# Patient Record
Sex: Male | Born: 1955
Health system: Southern US, Community
[De-identification: ages and names within clinical notes are randomized; demographics above are authoritative.]

## PROBLEM LIST (undated history)

## (undated) DIAGNOSIS — D509 Iron deficiency anemia, unspecified: Secondary | ICD-10-CM

## (undated) DIAGNOSIS — C61 Malignant neoplasm of prostate: Secondary | ICD-10-CM

## (undated) DIAGNOSIS — E785 Hyperlipidemia, unspecified: Secondary | ICD-10-CM

## (undated) DIAGNOSIS — E559 Vitamin D deficiency, unspecified: Secondary | ICD-10-CM

## (undated) DIAGNOSIS — N2589 Other disorders resulting from impaired renal tubular function: Secondary | ICD-10-CM

## (undated) DIAGNOSIS — N185 Chronic kidney disease, stage 5: Secondary | ICD-10-CM

## (undated) DIAGNOSIS — N189 Chronic kidney disease, unspecified: Secondary | ICD-10-CM

## (undated) DIAGNOSIS — N2581 Secondary hyperparathyroidism of renal origin: Secondary | ICD-10-CM

## (undated) DIAGNOSIS — N184 Chronic kidney disease, stage 4 (severe): Secondary | ICD-10-CM

## (undated) DIAGNOSIS — L600 Ingrowing nail: Secondary | ICD-10-CM

## (undated) DIAGNOSIS — Z0000 Encounter for general adult medical examination without abnormal findings: Secondary | ICD-10-CM

## (undated) DIAGNOSIS — H40059 Ocular hypertension, unspecified eye: Secondary | ICD-10-CM

## (undated) DIAGNOSIS — E1121 Type 2 diabetes mellitus with diabetic nephropathy: Secondary | ICD-10-CM

## (undated) DIAGNOSIS — Z125 Encounter for screening for malignant neoplasm of prostate: Secondary | ICD-10-CM

## (undated) DIAGNOSIS — H44009 Unspecified purulent endophthalmitis, unspecified eye: Secondary | ICD-10-CM

## (undated) DIAGNOSIS — D631 Anemia in chronic kidney disease: Secondary | ICD-10-CM

## (undated) DIAGNOSIS — B957 Other staphylococcus as the cause of diseases classified elsewhere: Secondary | ICD-10-CM

## (undated) DIAGNOSIS — K760 Fatty (change of) liver, not elsewhere classified: Secondary | ICD-10-CM

## (undated) DIAGNOSIS — E11319 Type 2 diabetes mellitus with unspecified diabetic retinopathy without macular edema: Secondary | ICD-10-CM

## (undated) DIAGNOSIS — I2510 Atherosclerotic heart disease of native coronary artery without angina pectoris: Secondary | ICD-10-CM

## (undated) DIAGNOSIS — I129 Hypertensive chronic kidney disease with stage 1 through stage 4 chronic kidney disease, or unspecified chronic kidney disease: Secondary | ICD-10-CM

## (undated) DIAGNOSIS — E119 Type 2 diabetes mellitus without complications: Secondary | ICD-10-CM

## (undated) DIAGNOSIS — Z8546 Personal history of malignant neoplasm of prostate: Secondary | ICD-10-CM

## (undated) DIAGNOSIS — M869 Osteomyelitis, unspecified: Secondary | ICD-10-CM

## (undated) DIAGNOSIS — I219 Acute myocardial infarction, unspecified: Secondary | ICD-10-CM

## (undated) HISTORY — DX: Type 2 diabetes mellitus with unspecified diabetic retinopathy without macular edema: E11.319

## (undated) HISTORY — DX: Chronic kidney disease, stage 5: N18.5

## (undated) HISTORY — DX: Acute myocardial infarction, unspecified: I21.9

## (undated) HISTORY — DX: Vitamin D deficiency, unspecified: E55.9

## (undated) HISTORY — DX: Malignant neoplasm of prostate: C61

## (undated) HISTORY — DX: Encounter for general adult medical examination without abnormal findings: Z00.00

## (undated) HISTORY — PX: CORONARY ANGIOPLASTY: SHX604

## (undated) HISTORY — DX: Chronic kidney disease, stage 4 (severe): N18.4

## (undated) HISTORY — DX: Chronic kidney disease, unspecified: N18.9

## (undated) HISTORY — DX: Type 2 diabetes mellitus with diabetic nephropathy: E11.21

## (undated) HISTORY — DX: Secondary hyperparathyroidism of renal origin: N25.81

## (undated) HISTORY — DX: Hyperlipidemia, unspecified: E78.5

## (undated) HISTORY — DX: Other staphylococcus as the cause of diseases classified elsewhere: B95.7

## (undated) HISTORY — DX: Type 2 diabetes mellitus without complications: E11.9

## (undated) HISTORY — PX: PROSTATE SURGERY: SHX751

## (undated) HISTORY — DX: Iron deficiency anemia, unspecified: D50.9

## (undated) HISTORY — DX: Personal history of malignant neoplasm of prostate: Z85.46

## (undated) HISTORY — DX: Other disorders resulting from impaired renal tubular function: N25.89

## (undated) HISTORY — DX: Osteomyelitis, unspecified: M86.9

## (undated) HISTORY — DX: Encounter for screening for malignant neoplasm of prostate: Z12.5

## (undated) HISTORY — PX: BACK SURGERY: SHX140

## (undated) HISTORY — DX: Hypertensive chronic kidney disease with stage 1 through stage 4 chronic kidney disease, or unspecified chronic kidney disease: I12.9

## (undated) HISTORY — DX: Ingrowing nail: L60.0

## (undated) HISTORY — PX: APPENDECTOMY: SHX54

## (undated) HISTORY — DX: Atherosclerotic heart disease of native coronary artery without angina pectoris: I25.10

## (undated) HISTORY — DX: Anemia in chronic kidney disease: D63.1

## (undated) HISTORY — DX: Unspecified purulent endophthalmitis, unspecified eye: H44.009

---

## 1979-05-03 HISTORY — PX: NASAL SEPTUM SURGERY: SHX37

## 2000-10-11 ENCOUNTER — Ambulatory Visit (HOSPITAL_COMMUNITY): Admission: RE | Admit: 2000-10-11 | Discharge: 2000-10-11 | Payer: Self-pay | Admitting: Orthopedic Surgery

## 2000-10-11 ENCOUNTER — Encounter: Payer: Self-pay | Admitting: Orthopedic Surgery

## 2005-10-17 ENCOUNTER — Ambulatory Visit: Payer: Self-pay | Admitting: Endocrinology

## 2005-10-18 ENCOUNTER — Encounter: Payer: Self-pay | Admitting: Endocrinology

## 2005-12-15 ENCOUNTER — Ambulatory Visit: Payer: Self-pay | Admitting: Endocrinology

## 2005-12-21 ENCOUNTER — Ambulatory Visit: Payer: Self-pay | Admitting: Endocrinology

## 2006-02-14 ENCOUNTER — Encounter: Payer: Self-pay | Admitting: Endocrinology

## 2006-12-18 ENCOUNTER — Encounter: Payer: Self-pay | Admitting: Internal Medicine

## 2006-12-18 ENCOUNTER — Inpatient Hospital Stay (HOSPITAL_COMMUNITY): Admission: EM | Admit: 2006-12-18 | Discharge: 2006-12-27 | Payer: Self-pay | Admitting: Internal Medicine

## 2006-12-18 ENCOUNTER — Ambulatory Visit: Payer: Self-pay | Admitting: Internal Medicine

## 2006-12-18 ENCOUNTER — Ambulatory Visit: Payer: Self-pay | Admitting: Cardiology

## 2006-12-19 ENCOUNTER — Encounter: Payer: Self-pay | Admitting: Internal Medicine

## 2006-12-20 ENCOUNTER — Ambulatory Visit: Payer: Self-pay | Admitting: Internal Medicine

## 2006-12-21 HISTORY — PX: VITRECTOMY: SHX106

## 2006-12-28 ENCOUNTER — Encounter: Payer: Self-pay | Admitting: Internal Medicine

## 2007-01-02 ENCOUNTER — Ambulatory Visit: Payer: Self-pay | Admitting: Endocrinology

## 2007-01-02 ENCOUNTER — Encounter: Payer: Self-pay | Admitting: Endocrinology

## 2007-01-02 ENCOUNTER — Telehealth: Payer: Self-pay | Admitting: Internal Medicine

## 2007-01-02 DIAGNOSIS — E119 Type 2 diabetes mellitus without complications: Secondary | ICD-10-CM | POA: Insufficient documentation

## 2007-01-02 DIAGNOSIS — E7800 Pure hypercholesterolemia, unspecified: Secondary | ICD-10-CM | POA: Insufficient documentation

## 2007-01-02 DIAGNOSIS — E785 Hyperlipidemia, unspecified: Secondary | ICD-10-CM | POA: Insufficient documentation

## 2007-01-09 ENCOUNTER — Encounter: Payer: Self-pay | Admitting: Internal Medicine

## 2007-01-15 ENCOUNTER — Ambulatory Visit (HOSPITAL_COMMUNITY): Admission: RE | Admit: 2007-01-15 | Discharge: 2007-01-15 | Payer: Self-pay | Admitting: Ophthalmology

## 2007-01-15 ENCOUNTER — Encounter: Payer: Self-pay | Admitting: Internal Medicine

## 2007-01-19 ENCOUNTER — Ambulatory Visit: Payer: Self-pay | Admitting: Endocrinology

## 2007-01-22 ENCOUNTER — Encounter: Payer: Self-pay | Admitting: Internal Medicine

## 2007-01-24 ENCOUNTER — Telehealth (INDEPENDENT_AMBULATORY_CARE_PROVIDER_SITE_OTHER): Payer: Self-pay | Admitting: *Deleted

## 2007-01-24 ENCOUNTER — Ambulatory Visit: Payer: Self-pay | Admitting: Internal Medicine

## 2007-01-24 DIAGNOSIS — M869 Osteomyelitis, unspecified: Secondary | ICD-10-CM | POA: Insufficient documentation

## 2007-01-24 DIAGNOSIS — H44009 Unspecified purulent endophthalmitis, unspecified eye: Secondary | ICD-10-CM | POA: Insufficient documentation

## 2007-01-24 DIAGNOSIS — B957 Other staphylococcus as the cause of diseases classified elsewhere: Secondary | ICD-10-CM | POA: Insufficient documentation

## 2007-01-30 ENCOUNTER — Telehealth: Payer: Self-pay | Admitting: Internal Medicine

## 2007-01-30 ENCOUNTER — Encounter: Payer: Self-pay | Admitting: *Deleted

## 2007-01-31 ENCOUNTER — Encounter: Payer: Self-pay | Admitting: Endocrinology

## 2007-01-31 ENCOUNTER — Ambulatory Visit: Payer: Self-pay | Admitting: Endocrinology

## 2007-02-21 ENCOUNTER — Encounter: Admission: RE | Admit: 2007-02-21 | Discharge: 2007-02-21 | Payer: Self-pay | Admitting: Neurosurgery

## 2007-03-16 ENCOUNTER — Encounter: Payer: Self-pay | Admitting: Endocrinology

## 2007-05-14 ENCOUNTER — Ambulatory Visit (HOSPITAL_COMMUNITY): Admission: RE | Admit: 2007-05-14 | Discharge: 2007-05-14 | Payer: Self-pay | Admitting: Ophthalmology

## 2007-07-11 ENCOUNTER — Ambulatory Visit: Payer: Self-pay | Admitting: Endocrinology

## 2007-07-12 ENCOUNTER — Encounter (INDEPENDENT_AMBULATORY_CARE_PROVIDER_SITE_OTHER): Payer: Self-pay | Admitting: *Deleted

## 2007-07-12 LAB — CONVERTED CEMR LAB
Bilirubin, Direct: 0.2 mg/dL (ref 0.0–0.3)
Eosinophils Absolute: 0.2 10*3/uL (ref 0.0–0.6)
Eosinophils Relative: 2.5 % (ref 0.0–5.0)
GFR calc Af Amer: 101 mL/min
GFR calc non Af Amer: 83 mL/min
Glucose, Bld: 226 mg/dL — ABNORMAL HIGH (ref 70–99)
HDL: 35.2 mg/dL — ABNORMAL LOW (ref >39.0)
Hemoglobin: 15.1 g/dL (ref 13.0–17.0)
Hgb A1c MFr Bld: 7.9 % — ABNORMAL HIGH (ref 4.6–6.0)
Iron: 89 ug/dL (ref 42–165)
Lymphocytes Relative: 25.2 % (ref 12.0–46.0)
MCV: 97 fL (ref 78.0–100.0)
Monocytes Absolute: 0.6 10*3/uL (ref 0.2–0.7)
Neutro Abs: 4.9 10*3/uL (ref 1.4–7.7)
Neutrophils Relative %: 64.4 % (ref 43.0–77.0)
Nitrite: NEGATIVE
Platelets: 223 10*3/uL (ref 150–400)
Potassium: 4.4 meq/L (ref 3.5–5.1)
Saturation Ratios: 28.7 % (ref 20.0–50.0)
Sodium: 138 meq/L (ref 135–145)
Total Protein: 7.7 g/dL (ref 6.0–8.3)
Triglycerides: 225 mg/dL (ref 0–149)
Urine Glucose: 500 mg/dL — AB
WBC: 7.6 10*3/uL (ref 4.5–10.5)

## 2007-07-16 LAB — CONVERTED CEMR LAB: Calcium, Total (PTH): 9.2 mg/dL (ref 8.4–10.5)

## 2007-07-31 ENCOUNTER — Ambulatory Visit: Payer: Self-pay | Admitting: Endocrinology

## 2007-07-31 DIAGNOSIS — L600 Ingrowing nail: Secondary | ICD-10-CM | POA: Insufficient documentation

## 2007-08-08 ENCOUNTER — Encounter: Payer: Self-pay | Admitting: Endocrinology

## 2007-10-29 ENCOUNTER — Encounter: Payer: Self-pay | Admitting: Endocrinology

## 2007-11-07 ENCOUNTER — Telehealth: Payer: Self-pay | Admitting: Endocrinology

## 2007-11-20 ENCOUNTER — Ambulatory Visit: Payer: Self-pay | Admitting: Endocrinology

## 2008-09-02 IMAGING — CR DG CHEST 1V PORT
1 series · 1 of 1 positions shown · non-contrast
Comparison: None.

CLINICAL DATA: Sepsis/shock. 
 PORTABLE CHEST ? 1 VIEW:

[view not recorded]
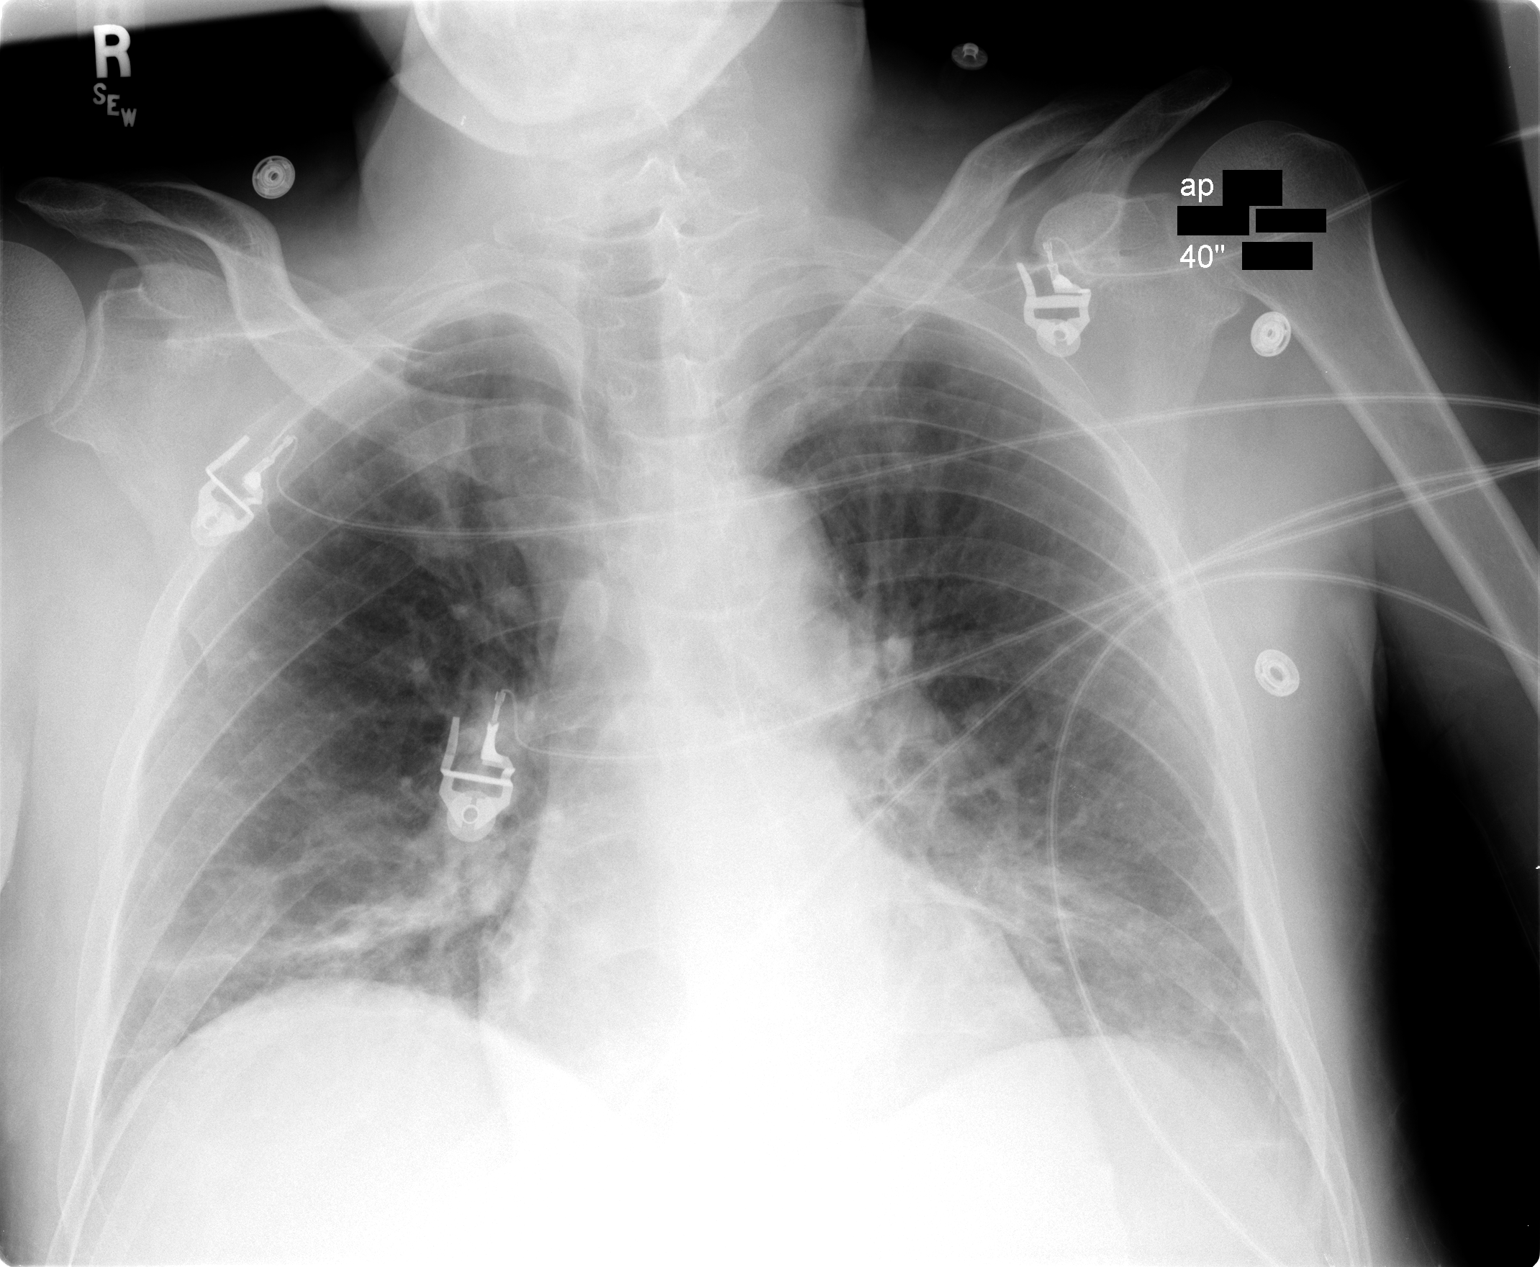

[1 of 1 positions shown; findings below may reference images not displayed]

FINDINGS: Heart and vascularity normal.  There are bilateral lower lobe densities consistent with subsegmental atelectasis or atelectatic pneumonia.  No heart failure.
IMPRESSION: Bilateral lower lobe subsegmental atelectasis or atelectatic pneumonia.

## 2008-09-06 IMAGING — CR DG LUMBAR SPINE 1V
1 series · 1 of 1 positions shown · non-contrast
Comparison: Lumbar MRI 12/19/06.

CLINICAL DATA: Sepsis.  L4-5 laminectomy.
 PORTABLE LUMBAR SPINE ? 1 VIEW ? 12/22/06:

[view not recorded]
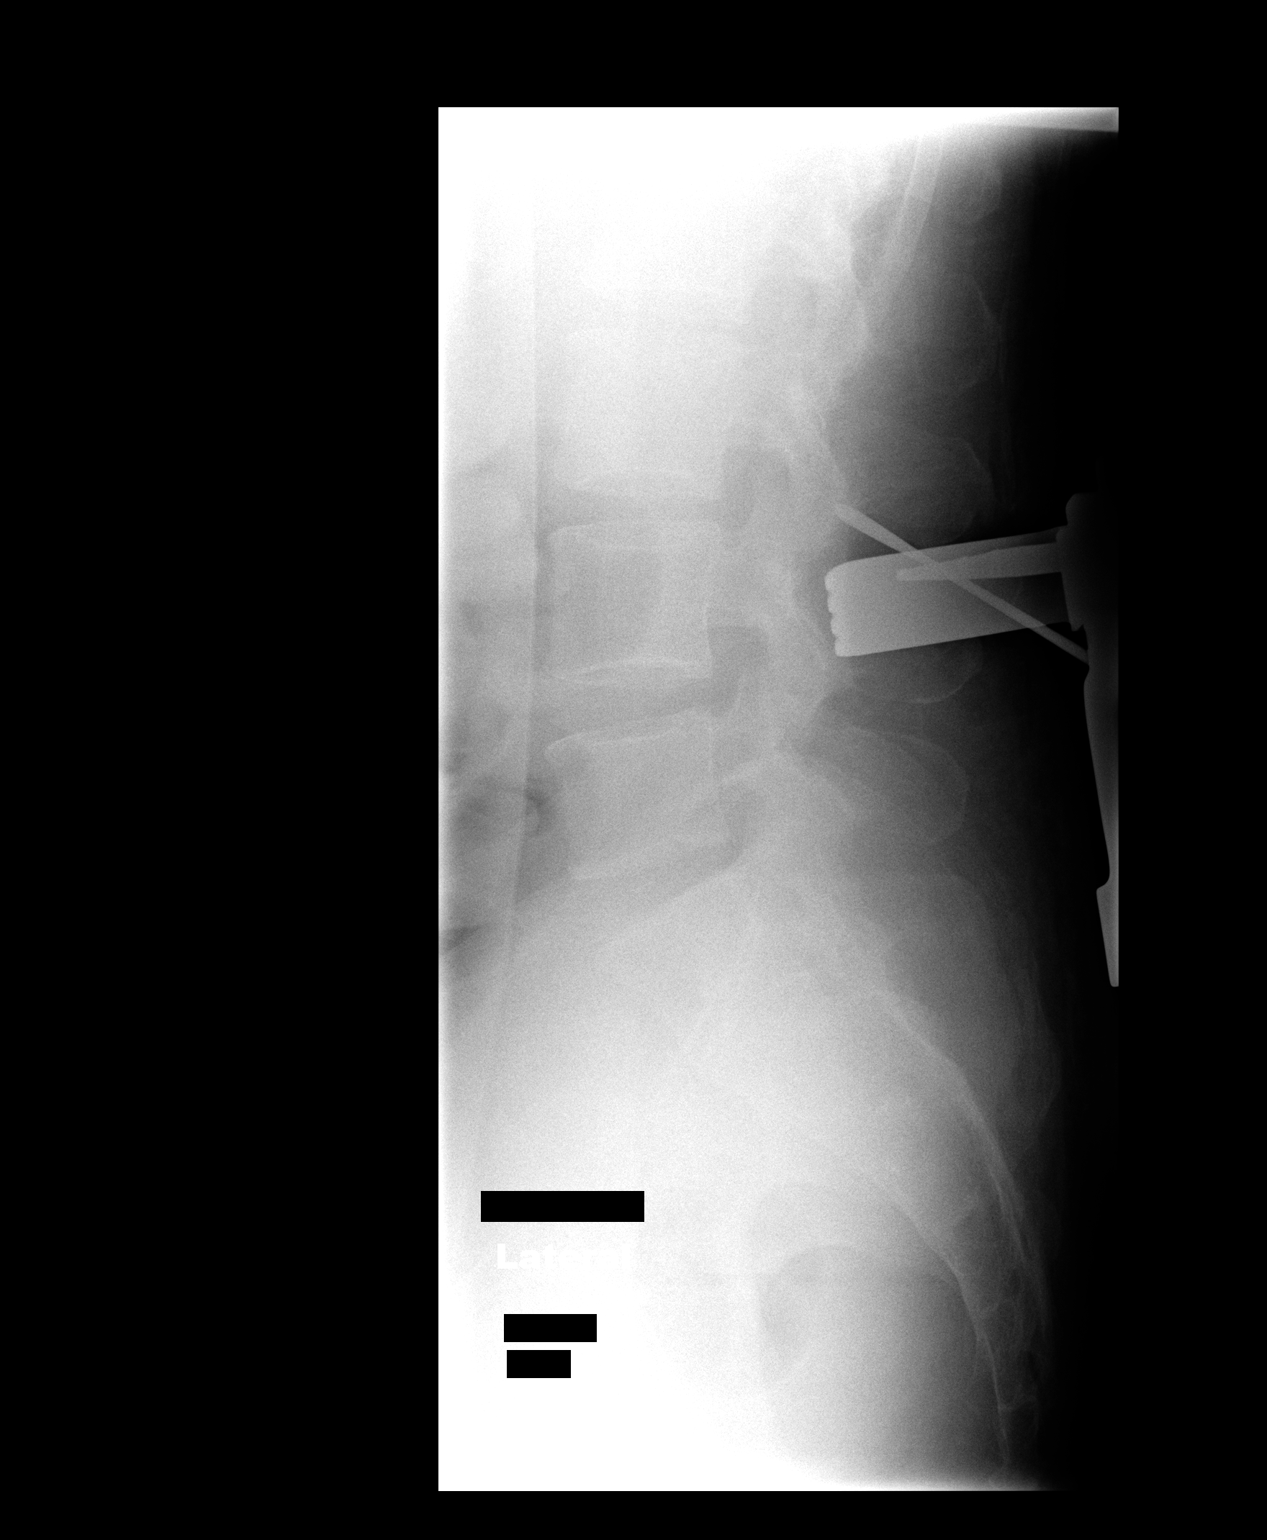

[1 of 1 positions shown; findings below may reference images not displayed]

FINDINGS: As on the MRI, five lumbar type vertebral bodies are assumed. There are retractors posterior to the L4 vertebral body, overlapping the L4 spinous process.  A probe is directed towards the posterior aspect of the L3-4 disk space level.
IMPRESSION: Intraoperative localization as described.

## 2008-09-10 IMAGING — CR DG CHEST 1V PORT
1 series · 1 of 1 positions shown · non-contrast
Comparison: 12/20/06.

CLINICAL DATA: PICC placement.  
 PORTABLE CHEST ? 1 VIEW:

[view not recorded]
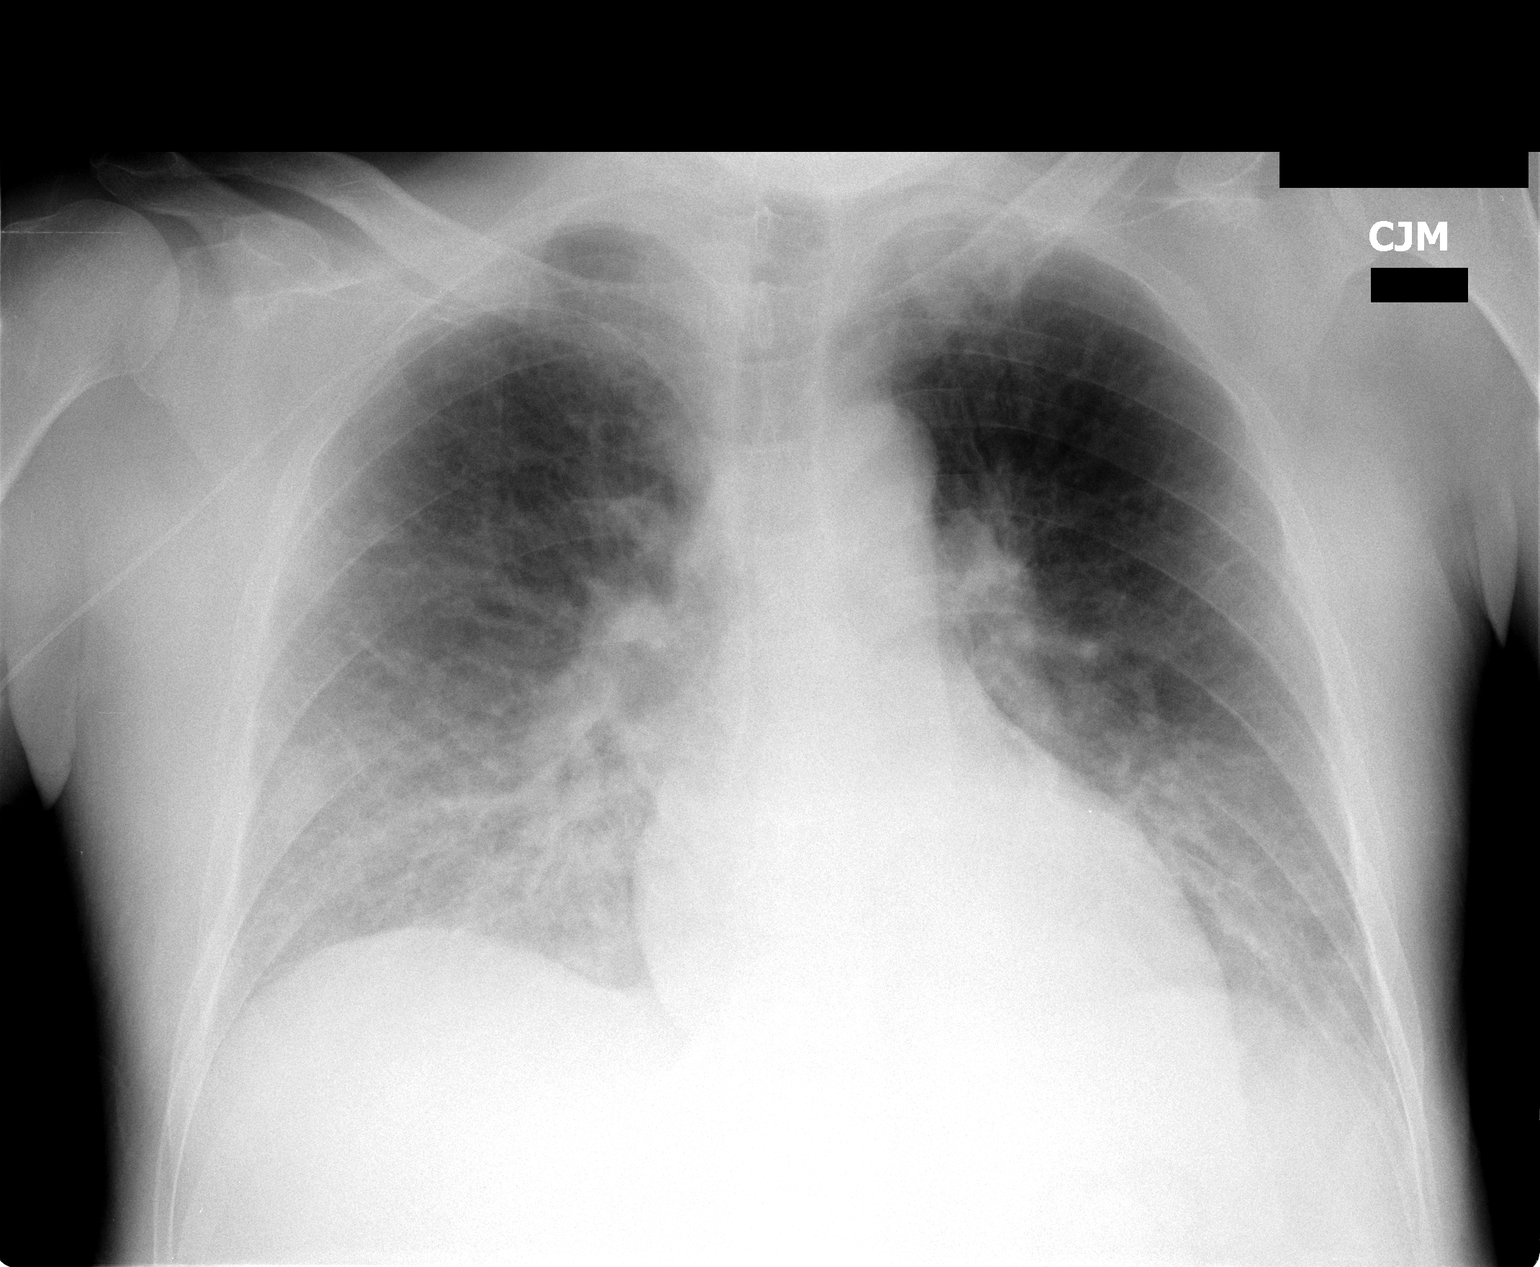

[1 of 1 positions shown; findings below may reference images not displayed]

FINDINGS: New right-sided PICC is in place with the tip in the lower SVC.  No pneumothorax.  Nodular opacities seen on the prior study are not as well visualized today.  Indistinctness of the pulmonary vasculature is compatible with interstitial edema.
IMPRESSION: 1.  PICC in place without complicating features.  
 2.  Mild interstitial pulmonary edema.

## 2008-09-30 IMAGING — CR DG CHEST 2V
2 series · 2 of 2 positions shown · non-contrast
Comparison: 12/26/2006.

CLINICAL DATA: Retinal detachment. Preoperative evaluation.

PORTABLE CHEST - 1 VIEW

[view not recorded (1 of 2)]
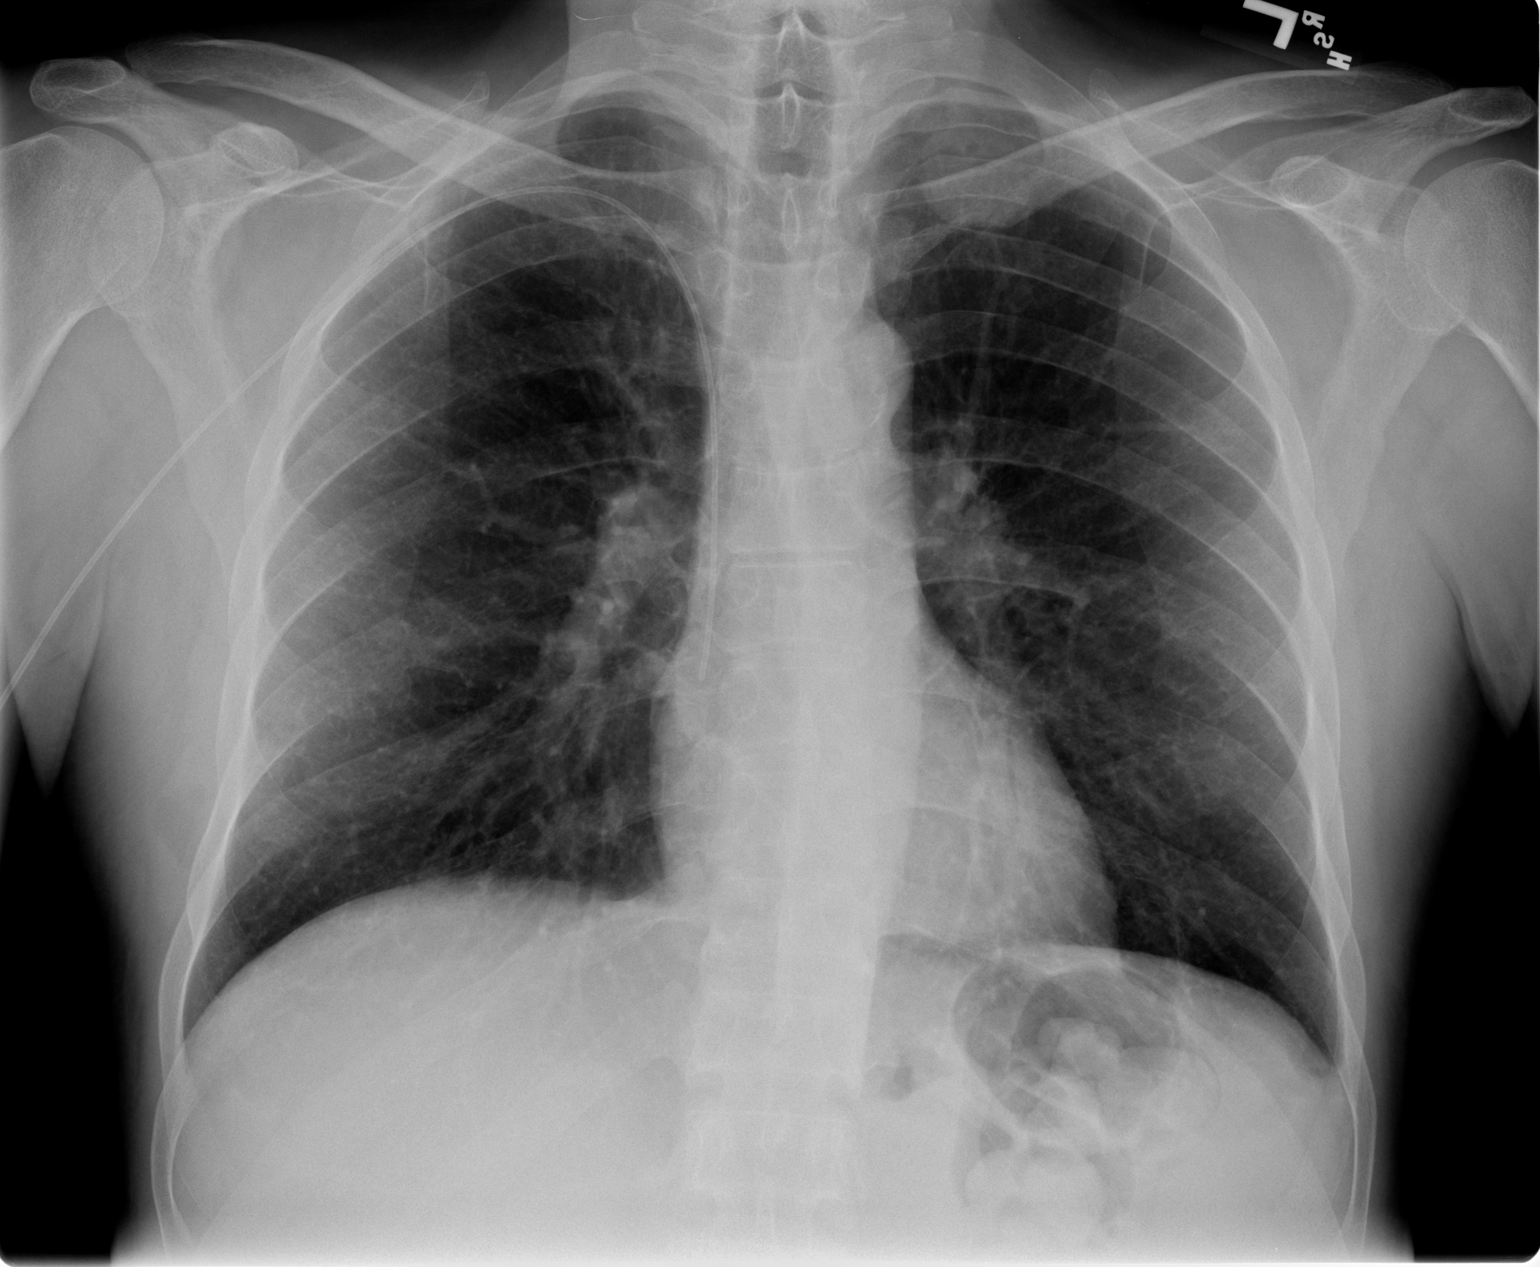

[view not recorded (2 of 2)]
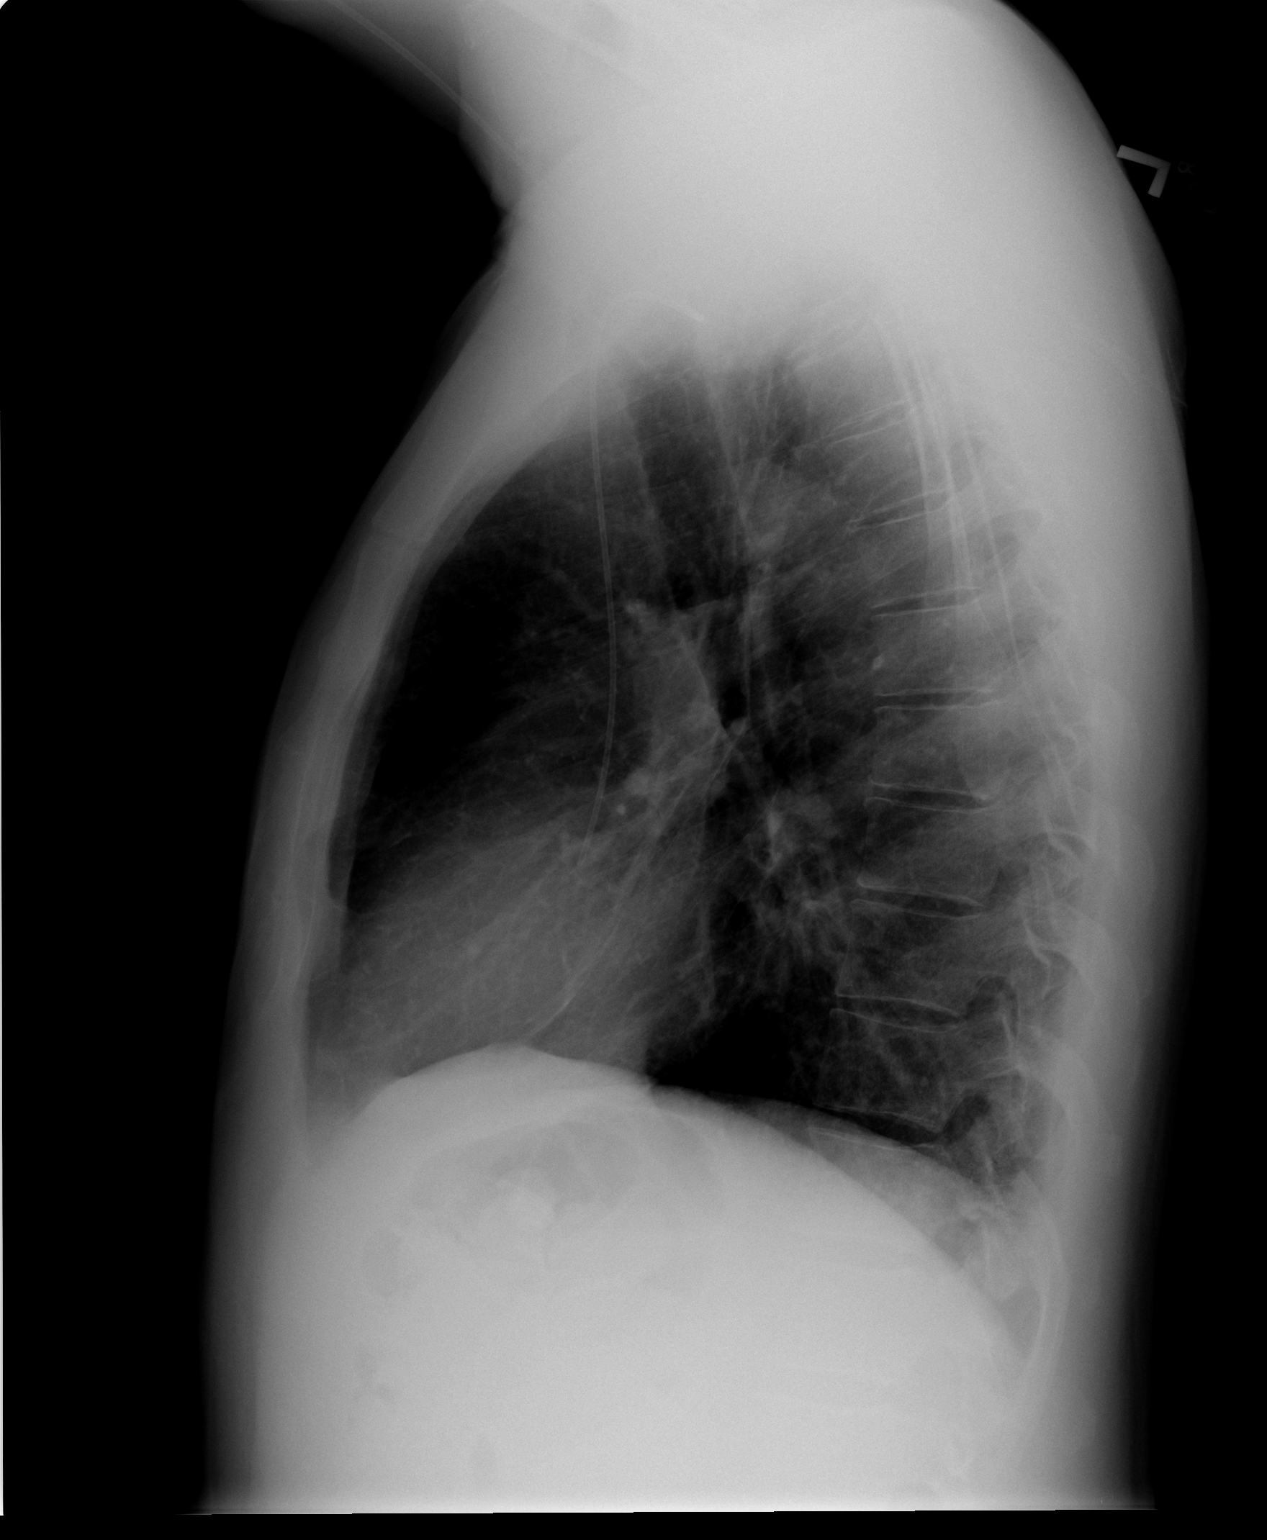

[2 of 2 positions shown; findings below may reference images not displayed]

FINDINGS: Stable right PICC. Normal sized heart. Mild changes of COPD and
chronic bronchitis. Mild scoliosis.  

IMPRESSION

Mild changes of COPD and chronic bronchitis. No acute abnormality.

## 2009-01-15 ENCOUNTER — Telehealth: Payer: Self-pay | Admitting: Endocrinology

## 2009-01-29 ENCOUNTER — Ambulatory Visit: Payer: Self-pay | Admitting: Endocrinology

## 2009-01-29 ENCOUNTER — Telehealth: Payer: Self-pay | Admitting: Endocrinology

## 2009-01-30 LAB — CONVERTED CEMR LAB
AST: 26 units/L (ref 0–37)
Albumin: 4.4 g/dL (ref 3.5–5.2)
Alkaline Phosphatase: 80 units/L (ref 39–117)
Basophils Relative: 0.4 % (ref 0.0–3.0)
CO2: 29 meq/L (ref 19–32)
Calcium: 9.9 mg/dL (ref 8.4–10.5)
Direct LDL: 146.8 mg/dL
GFR calc non Af Amer: 66.83 mL/min (ref >60)
HCT: 43.6 % (ref 39.0–52.0)
Hemoglobin: 15.7 g/dL (ref 13.0–17.0)
Lymphocytes Relative: 22.7 % (ref 12.0–46.0)
Lymphs Abs: 1.6 10*3/uL (ref 0.7–4.0)
MCHC: 35.9 g/dL (ref 30.0–36.0)
Monocytes Relative: 6.4 % (ref 3.0–12.0)
Neutro Abs: 4.7 10*3/uL (ref 1.4–7.7)
Nitrite: NEGATIVE
Potassium: 4.5 meq/L (ref 3.5–5.1)
RBC: 4.44 M/uL (ref 4.22–5.81)
Sodium: 139 meq/L (ref 135–145)
Specific Gravity, Urine: 1.025 (ref 1.000–1.030)
Total CHOL/HDL Ratio: 7
Total Protein, Urine: NEGATIVE mg/dL
Total Protein: 7.7 g/dL (ref 6.0–8.3)
Triglycerides: 240 mg/dL — ABNORMAL HIGH (ref 0.0–149.0)
pH: 5.5 (ref 5.0–8.0)

## 2009-02-16 ENCOUNTER — Encounter: Payer: Self-pay | Admitting: Endocrinology

## 2009-02-19 ENCOUNTER — Telehealth (INDEPENDENT_AMBULATORY_CARE_PROVIDER_SITE_OTHER): Payer: Self-pay | Admitting: *Deleted

## 2009-03-06 ENCOUNTER — Encounter: Payer: Self-pay | Admitting: Endocrinology

## 2009-03-12 ENCOUNTER — Encounter: Payer: Self-pay | Admitting: Endocrinology

## 2009-04-07 ENCOUNTER — Encounter: Payer: Self-pay | Admitting: Endocrinology

## 2009-05-02 DIAGNOSIS — C61 Malignant neoplasm of prostate: Secondary | ICD-10-CM

## 2009-05-02 HISTORY — DX: Malignant neoplasm of prostate: C61

## 2009-05-04 ENCOUNTER — Encounter (INDEPENDENT_AMBULATORY_CARE_PROVIDER_SITE_OTHER): Payer: Self-pay | Admitting: Urology

## 2009-05-04 ENCOUNTER — Inpatient Hospital Stay (HOSPITAL_COMMUNITY): Admission: RE | Admit: 2009-05-04 | Discharge: 2009-05-05 | Payer: Self-pay | Admitting: Urology

## 2009-05-12 ENCOUNTER — Encounter: Payer: Self-pay | Admitting: Endocrinology

## 2009-07-08 ENCOUNTER — Encounter: Payer: Self-pay | Admitting: Endocrinology

## 2009-11-24 ENCOUNTER — Encounter: Payer: Self-pay | Admitting: Endocrinology

## 2010-02-18 ENCOUNTER — Ambulatory Visit: Payer: Self-pay | Admitting: Endocrinology

## 2010-02-18 LAB — CONVERTED CEMR LAB
ALT: 21 units/L (ref 0–53)
Alkaline Phosphatase: 80 units/L (ref 39–117)
Basophils Relative: 0.4 % (ref 0.0–3.0)
Bilirubin Urine: NEGATIVE
Bilirubin, Direct: 0.1 mg/dL (ref 0.0–0.3)
Chloride: 105 meq/L (ref 96–112)
Cholesterol: 212 mg/dL — ABNORMAL HIGH (ref 0–200)
Creatinine, Ser: 1 mg/dL (ref 0.4–1.5)
Creatinine,U: 115.5 mg/dL
Eosinophils Relative: 2.8 % (ref 0.0–5.0)
Hemoglobin, Urine: NEGATIVE
Hemoglobin: 15 g/dL (ref 13.0–17.0)
MCV: 100 fL (ref 78.0–100.0)
Microalb, Ur: 5.7 mg/dL — ABNORMAL HIGH (ref 0.0–1.9)
Monocytes Absolute: 0.4 10*3/uL (ref 0.1–1.0)
Neutrophils Relative %: 68 % (ref 43.0–77.0)
PSA: 0 ng/mL — ABNORMAL LOW (ref 0.10–4.00)
Potassium: 4.6 meq/L (ref 3.5–5.1)
RBC: 4.26 M/uL (ref 4.22–5.81)
Sodium: 139 meq/L (ref 135–145)
Total CHOL/HDL Ratio: 6
Total Protein: 7.1 g/dL (ref 6.0–8.3)
Urine Glucose: 250 mg/dL
Urobilinogen, UA: 0.2 (ref 0.0–1.0)
WBC: 6.8 10*3/uL (ref 4.5–10.5)

## 2010-02-25 ENCOUNTER — Encounter: Payer: Self-pay | Admitting: Endocrinology

## 2010-02-25 ENCOUNTER — Ambulatory Visit: Payer: Self-pay | Admitting: Endocrinology

## 2010-02-25 DIAGNOSIS — C61 Malignant neoplasm of prostate: Secondary | ICD-10-CM | POA: Insufficient documentation

## 2010-05-02 HISTORY — PX: CARDIAC VALVE SURGERY: SHX40

## 2010-05-30 LAB — CONVERTED CEMR LAB
ALT: 30 units/L (ref 0–53)
Basophils Absolute: 0 10*3/uL (ref 0.0–0.1)
Bilirubin Urine: NEGATIVE
Bilirubin, Direct: 0.1 mg/dL (ref 0.0–0.3)
Calcium: 9.4 mg/dL (ref 8.4–10.5)
Cholesterol: 225 mg/dL (ref 0–200)
Creatinine, Ser: 1 mg/dL (ref 0.4–1.5)
Creatinine,U: 83.2 mg/dL
Crystals: NEGATIVE
Direct LDL: 129.1 mg/dL
GFR calc Af Amer: 101 mL/min
GFR calc non Af Amer: 83 mL/min
HCT: 37.8 % — ABNORMAL LOW (ref 39.0–52.0)
Hemoglobin, Urine: NEGATIVE
Leukocytes, UA: NEGATIVE
Lymphocytes Relative: 28.5 % (ref 12.0–46.0)
MCHC: 35.9 g/dL (ref 30.0–36.0)
Microalb Creat Ratio: 24 mg/g (ref 0.0–30.0)
Microalb, Ur: 2 mg/dL — ABNORMAL HIGH (ref 0.0–1.9)
Monocytes Absolute: 0.4 10*3/uL (ref 0.1–1.0)
Monocytes Relative: 7.6 % (ref 3.0–12.0)
Neutro Abs: 3.5 10*3/uL (ref 1.4–7.7)
Nitrite: NEGATIVE
PSA: 3.25 ng/mL (ref 0.10–4.00)
Platelets: 223 10*3/uL (ref 150–400)
RDW: 11.7 % (ref 11.5–14.6)
Sodium: 137 meq/L (ref 135–145)
Total Bilirubin: 1.3 mg/dL — ABNORMAL HIGH (ref 0.3–1.2)
Total CHOL/HDL Ratio: 6.8
Total Protein, Urine: NEGATIVE mg/dL
Triglycerides: 328 mg/dL (ref 0–149)
VLDL: 66 mg/dL — ABNORMAL HIGH (ref 0–40)

## 2010-06-01 NOTE — Assessment & Plan Note (Signed)
Summary: CPX   Vital Signs:  Patient profile:   55 year old male Height:      65 inches (165.10 cm) Weight:      167.50 pounds (76.14 kg) BMI:     27.97 O2 Sat:      97 % on Room air Temp:     97.5 degrees F (36.39 degrees C) oral Pulse rate:   69 / minute Pulse rhythm:   regular BP sitting:   122 / 86  (left arm) Cuff size:   regular  Vitals Entered By: Rebeca Alert MA (February 25, 2010 8:37 AM)  O2 Flow:  Room air CC: Physical/aj Is Patient Diabetic? Yes Comments Pt is no longer taking Januvia, Acots, Pravachol, Isopto Hyoscine, Zymar   CC:  Physical/aj.  Current Medications (verified): 1)  Isopto Hyoscine 0.25 %  Soln (Scopolamine Hbr) .... Use Qid Qd 2)  Zymar 0.3 %  Soln (Gatifloxacin) .... Use Qid Qd 3)  Glucose Test Strips and Lancets .... Any Brand, Qd 4)  Januvia 100 Mg  Tabs (Sitagliptin Phosphate) .... Qd 5)  Metformin Hcl 500 Mg Xr24h-Tab (Metformin Hcl) .... 2 Bid 6)  Pravachol 80 Mg Tabs (Pravastatin Sodium) .Marland Kitchen.. 1 Qhs 7)  Actos 45 Mg Tabs (Pioglitazone Hcl) .Marland Kitchen.. 1 Qd  Allergies (verified): No Known Drug Allergies  Family History: Reviewed history from 11/20/2007 and no changes required. father had prostate cancer  Social History: Reviewed history from 01/29/2009 and no changes required. works heating/ac married  Review of Systems  The patient denies fever, weight loss, decreased hearing, syncope, dyspnea on exertion, prolonged cough, headaches, abdominal pain, melena, hematochezia, severe indigestion/heartburn, hematuria, suspicious skin lesions, and depression.         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   Rectal:  sees urology  Prostate:  sees urology  Skin:  there are 2 bite-marks on the left thigh (dog attack).  no open ulcer or erythema there are healing abrasions on the left knee Additional Exam:  SEPARATE EVALUATION FOLLOWS--EACH PROBLEM HERE IS NEW, NOT RESPONDING TO TREATMENT,  OR POSES SIGNIFICANT RISK TO THE PATIENT'S HEALTH: HISTORY OF THE PRESENT ILLNESS: dm:  is not taking 2 of his 3 meds.  no hypoglycemia. dyslipidemia:  he does not take pracachol.  no weight gain PAST MEDICAL HISTORY reviewed and up to date today. REVIEW OF SYSTEMS: denies chest pain PHYSICAL EXAMINATION: no deformity.  no ulcer on the feet.  feet are of normal color and temp.  no edema dorsalis pedis intact bilat.  no carotid bruit clear to auscultation.  no respiratory distress LAB/XRAY RESULTS: a1c and lipids are reviewed.   IMPRESSION: dyslipidemia, needs increased rx dm, needs increased rx PLAN: see instruction sheet   Impression & Recommendations:  Problem # 1:  ROUTINE GENERAL MEDICAL EXAM@HEALTH  CARE FACL (ICD-V70.0)  Medications Added to Medication List This Visit: 1)  Januvia 100 Mg Tabs (Sitagliptin phosphate) .Marland Kitchen.. 1 tab each am 2)  Metformin Hcl 500 Mg Xr24h-tab (Metformin hcl) .... 2 tabs two times a day 3)  Pravachol 80 Mg Tabs (Pravastatin sodium) .Marland Kitchen.. 1 at bedtime 4)  Onetouch Ultra Blue Strp (Glucose blood) .... Once daily, and lancets 250.00  Other Orders: EKG w/ Interpretation (93000) Admin 1st Vaccine [id number]) Flu Vaccine 62yrs + [id number]) Pneumococcal Vaccine [id number]) Admin of Any Addtl Vaccine [id number]) Est. Patient Level III [id number]) Est. Patient 40-64 years [id number])  Patient Instructions: 1)  resume januvia 100 mg each  am, and pravastatin 80 mg at bedtime. 2)  good diet and exercise habits significanly improve the control of your diabetes.  please let me know if you wish to be referred to a dietician.  high blood sugar is very risky to your health.  you should see an eye doctor every year. 3)  controlling your blood pressure and cholesterol drastically reduces the damage diabetes does to your body.  this also applies to quitting smoking.  please discuss these with your doctor.  you should take an aspirin every day, unless you have been advised by a doctor not  to. 4)  please consider these measures for your health:  minimize alcohol.  do not use tobacco products.  have a colonoscopy at least every 10 years from age 55.  keep firearms safely stored.  always use seat belts.  have working smoke alarms in your home.  see an eye doctor and dentist regularly.  never drive under the influence of alcohol or drugs (including prescription drugs).   5)  Please schedule a follow-up appointment in 6 months. Prescriptions: ONETOUCH ULTRA BLUE  STRP (GLUCOSE BLOOD) once daily, and lancets 250.00  #100 x 3   Entered and Authorized by:   Donavan Foil MD   Signed by:   Donavan Foil MD on 02/25/2010   Method used:   Electronically to        [facility]. [phone]* (retail)       [address], [place]  [address]       Ph: [phone] or [phone]       Fax: [phone]   RxID:   [id number] PRAVACHOL 80 MG TABS (PRAVASTATIN SODIUM) 1 at bedtime  #90 x 3   Entered and Authorized by:   Donavan Foil MD   Signed by:   Donavan Foil MD on 02/25/2010   Method used:   Electronically to        [facility]. [phone]* (retail)       [address], [place]  [address]       Ph: [phone] or [phone]       Fax: [phone]   RxID:   [id number] METFORMIN HCL 500 MG XR24H-TAB (METFORMIN HCL) 2 tabs two times a day  #360 x 3   Entered and Authorized by:   Donavan Foil MD   Signed by:   Donavan Foil MD on 02/25/2010   Method used:   Electronically to        [facility]. [phone]* (retail)       [address], [place]  [address]       Ph: [phone] or [phone]       Fax: [phone]   RxID:   [id number] JANUVIA 100 MG  TABS (SITAGLIPTIN PHOSPHATE) 1 tab each am  #90 x 3   Entered and Authorized by:   Donavan Foil MD   Signed by:   Donavan Foil MD on 02/25/2010   Method used:    Electronically to        [facility]. [phone]* (retail)       [address]  [place], [place]  [address]       Ph: [phone] or [phone]       Fax: [phone]   RxID:   [id number]    Orders Added: 1)  EKG w/ Interpretation [93000] 2)  Admin 1st Vaccine [90471] 3)  Flu Vaccine 35yrs + [id number] 4)  Pneumococcal Vaccine [id number] 5)  Admin of Any Addtl Vaccine [id number])  Est. Patient Level III [id number] 7)  Est. Patient 40-64 years [99396]   Immunizations Administered:  Pneumonia Vaccine:    Vaccine Type: Pneumovax    Site: right deltoid    Mfr: Merck    Dose: 0.5 ml    Route: IM    Given by: Rebeca Alert MA    Exp. Date: 07/15/2011    Lot #: [id number]    VIS given: 04/06/09 version given February 25, 2010. Flu Vaccine Consent Questions     Do you have a history of severe allergic reactions to this vaccine? no    Any prior history of allergic reactions to egg and/or gelatin? no    Do you have a sensitivity to the preservative Thimersol? no    Do you have a past history of Guillan-Barre Syndrome? no    Do you currently have an acute febrile illness? no    Have you ever had a severe reaction to latex? no    Vaccine information given and explained to patient? yes    Are you currently pregnant? no    Lot Number:[id number]   Exp Date:10/30/2010   Site Given  Left Deltoid IM  Immunizations Administered:  Pneumonia Vaccine:    Vaccine Type: Pneumovax    Site: right deltoid    Mfr: Merck    Dose: 0.5 ml    Route: IM    Given by: Rebeca Alert MA    Exp. Date: 07/15/2011    Lot #: [id number]    VIS given: 04/06/09 version given February 25, 2010.      Marland Kitchenlbflu1

## 2010-06-01 NOTE — Letter (Signed)
Summary: [facility]   Imported By: Rise Patience 07/21/2009 14:37:09  _____________________________________________________________________  External Attachment:    Type:   Image     Comment:   External Document

## 2010-06-01 NOTE — Letter (Signed)
Summary: [facility]  [facility]   Imported By: Bubba Hales 05/26/2009 17:15:33  _____________________________________________________________________  External Attachment:    Type:   Image     Comment:   External Document

## 2010-06-01 NOTE — Letter (Signed)
Summary: [facility]  [facility]   Imported By: Bubba Hales 12/02/2009 08:29:52  _____________________________________________________________________  External Attachment:    Type:   Image     Comment:   External Document

## 2010-06-09 ENCOUNTER — Encounter: Payer: Self-pay | Admitting: Endocrinology

## 2010-06-23 NOTE — Letter (Signed)
Summary: [facility]   Imported By: Phillis Knack 06/15/2010 10:22:18  _____________________________________________________________________  External Attachment:    Type:   Image     Comment:   External Document

## 2010-07-01 ENCOUNTER — Other Ambulatory Visit: Payer: Self-pay | Admitting: Endocrinology

## 2010-07-01 ENCOUNTER — Other Ambulatory Visit: Payer: BC Managed Care – PPO

## 2010-07-01 ENCOUNTER — Encounter: Payer: Self-pay | Admitting: Endocrinology

## 2010-07-01 ENCOUNTER — Ambulatory Visit (INDEPENDENT_AMBULATORY_CARE_PROVIDER_SITE_OTHER): Payer: BC Managed Care – PPO | Admitting: Endocrinology

## 2010-07-01 DIAGNOSIS — E119 Type 2 diabetes mellitus without complications: Secondary | ICD-10-CM

## 2010-07-01 DIAGNOSIS — E785 Hyperlipidemia, unspecified: Secondary | ICD-10-CM

## 2010-07-01 LAB — LIPID PANEL
LDL Cholesterol: 114 mg/dL — ABNORMAL HIGH (ref 0–99)
VLDL: 32.6 mg/dL (ref 0.0–40.0)

## 2010-07-01 LAB — HEMOGLOBIN A1C: Hgb A1c MFr Bld: 8.6 % — ABNORMAL HIGH (ref 4.6–6.5)

## 2010-07-08 NOTE — Assessment & Plan Note (Signed)
Summary: soer throat/head cold/lb   Vital Signs:  Patient profile:   56 year old male Height:      65 inches (165.10 cm) Weight:      161.13 pounds (73.24 kg) BMI:     26.91 O2 Sat:      97 % on Room air Temp:     99.0 degrees F (37.22 degrees C) oral Pulse rate:   87 / minute Pulse rhythm:   regular BP sitting:   118 / 80  (left arm) Cuff size:   regular  Vitals Entered By: Rebeca Alert CMA Deborra Medina) (July 01, 2010 8:28 AM)  O2 Flow:  Room air CC: Productive cough with greenish mucus, sore throat, HA x 6 days/aj Is Patient Diabetic? Yes   CC:  Productive cough with greenish mucus, sore throat, and HA x 6 days/aj.  History of Present Illness: 6 days of moderate pain at the throat, prod-quality cough, and associated right maxillary pain.   Current Medications (verified): 1)  Januvia 100 Mg  Tabs (Sitagliptin Phosphate) .Marland Kitchen.. 1 Tab Each Am 2)  Metformin Hcl 500 Mg Xr24h-Tab (Metformin Hcl) .... 2 Tabs Two Times A Day 3)  Pravachol 80 Mg Tabs (Pravastatin Sodium) .Marland Kitchen.. 1 At Bedtime 4)  Onetouch Ultra Blue  Strp (Glucose Blood) .... Once Daily, and Lancets 250.00  Allergies (verified): No Known Drug Allergies  Past History:  Past Medical History: Last updated: 11/20/2007 SPECIAL SCREENING MALIGNANT NEOPLASM OF PROSTATE (ICD-V76.44) INGROWN TOENAIL (ICD-703.0) ROUTINE GENERAL MEDICAL EXAM@HEALTH  CARE FACL (ICD-V70.0) ENDOPHTHALMITIS, ACUTE (ICD-360.01) OSTEOMYELITIS (ICD-730.20) METHICILLIN RESISTANT STAPHYLOCOCCUS AUREUS INFECTION (ICD-041.19) HYPERLIPIDEMIA (ICD-272.4) DIABETES MELLITUS, TYPE II (ICD-250.00)  Review of Systems  The patient denies fever and dyspnea on exertion.         he has a slight headache.  there is a slight bilat earache.    Physical Exam  General:  normal appearance.   Head:  head: no deformity eyes: no periorbital swelling, no proptosis external nose and ears are normal mouth: no lesion seen Ears:  TM's intact and clear with normal  canals with grossly normal hearing.   Lungs:  Clear to auscultation bilaterally. Normal respiratory effort.  Additional Exam:  LDL Cholesterol      [H]  114 mg/dL                   0-99 Hemoglobin A1C       [H]  8.6 %        Impression & Recommendations:  Problem # 1:  uri new  Problem # 2:  DIABETES MELLITUS, TYPE II (ICD-250.00) needs increased rx  Problem # 3:  HYPERLIPIDEMIA (ICD-272.4) needs increased rx  Medications Added to Medication List This Visit: 1)  Azithromycin 500 Mg Tabs (Azithromycin) .Marland Kitchen.. 1 tab once daily 2)  Promethazine-codeine 6.25-10 Mg/77ml Syrp (Promethazine-codeine) .Marland Kitchen.. 1 teaspoon every 4 hrs as needed for cough  Other Orders: TLB-Lipid Panel (80061-LIPID) TLB-A1C / Hgb A1C (Glycohemoglobin) (83036-A1C) Est. Patient Level IV [id number])  Patient Instructions: 1)  take azithromycin 500 mg once daily 2)  take loratadine-d (non-prescription) as needed for congestion. 3)  here is a prescription for cough syrup. 4)  blood tests are being ordered for you today.  please call [phone] to hear your test results. 5)  your next physical is due in october.  6)  (update: i left message on phone-tree:  i advised change pravachol to lipitor.  aren't you on actos?  please call back) Prescriptions: PROMETHAZINE-CODEINE 6.25-10 MG/5ML SYRP (PROMETHAZINE-CODEINE) 1  teaspoon every 4 hrs as needed for cough  #8 oz x 1   Entered and Authorized by:   Donavan Foil MD   Signed by:   Donavan Foil MD on 07/01/2010   Method used:   Print then Give to Patient   [id number] AZITHROMYCIN 500 MG TABS (AZITHROMYCIN) 1 tab once daily  #6 x 0   Entered and Authorized by:   Donavan Foil MD   Signed by:   Donavan Foil MD on 07/01/2010   Method used:   Electronically to        [facility]. [phone]* (retail)       [address]       [place], [place]  [address]       Ph: [phone] or [phone]       Fax: [phone]   RxID:    [id number]    Orders Added: 1)  TLB-Lipid Panel [80061-LIPID] 2)  TLB-A1C / Hgb A1C (Glycohemoglobin) [83036-A1C] 3)  Est. Patient Level IV [id number]

## 2010-07-18 LAB — GLUCOSE, CAPILLARY: Glucose-Capillary: 135 mg/dL — ABNORMAL HIGH (ref 70–99)

## 2010-07-18 LAB — HEMOGLOBIN AND HEMATOCRIT, BLOOD
HCT: 34.6 % — ABNORMAL LOW (ref 39.0–52.0)
Hemoglobin: 12 g/dL — ABNORMAL LOW (ref 13.0–17.0)

## 2010-08-02 LAB — COMPREHENSIVE METABOLIC PANEL
ALT: 22 U/L (ref 0–53)
AST: 19 U/L (ref 0–37)
Alkaline Phosphatase: 66 U/L (ref 39–117)
CO2: 27 mEq/L (ref 19–32)
Chloride: 105 mEq/L (ref 96–112)
Creatinine, Ser: 1.05 mg/dL (ref 0.4–1.5)
GFR calc Af Amer: >60 mL/min (ref >60)
GFR calc non Af Amer: >60 mL/min (ref >60)
Potassium: 4.1 mEq/L (ref 3.5–5.1)
Total Bilirubin: 0.7 mg/dL (ref 0.3–1.2)

## 2010-08-02 LAB — CBC
HCT: 40 % (ref 39.0–52.0)
Hemoglobin: 13.9 g/dL (ref 13.0–17.0)
MCV: 99.3 fL (ref 78.0–100.0)
Platelets: 189 10*3/uL (ref 150–400)
RDW: 12.9 % (ref 11.5–15.5)
WBC: 6.4 10*3/uL (ref 4.0–10.5)

## 2010-08-26 ENCOUNTER — Ambulatory Visit (INDEPENDENT_AMBULATORY_CARE_PROVIDER_SITE_OTHER): Payer: BC Managed Care – PPO | Admitting: Endocrinology

## 2010-08-26 ENCOUNTER — Encounter: Payer: Self-pay | Admitting: Endocrinology

## 2010-08-26 DIAGNOSIS — E119 Type 2 diabetes mellitus without complications: Secondary | ICD-10-CM

## 2010-08-26 DIAGNOSIS — E785 Hyperlipidemia, unspecified: Secondary | ICD-10-CM

## 2010-08-26 NOTE — Progress Notes (Signed)
  Subjective:    Patient ID: Jason Zimmerman, male    DOB: 1955-09-24, 55 y.o.   MRN: [id number]  HPI Pt says he intermittently takes the pravachol.  He wants to try taking it consistently, prior to taking lipitor. He says he also takes the dm meds inconsistently.   Past Medical History  Diagnosis Date  . Special screening for malignant neoplasm of prostate   . Ingrowing nail   . Routine general medical examination at a health care facility   . Acute endophthalmitis   . Unspecified osteomyelitis, site unspecified   . Other staphylococcus infection in conditions classified elsewhere and of unspecified site   . Ganglion and cyst of synovium, tendon, and bursa   . Type II or unspecified type diabetes mellitus without mention of complication, not stated as uncontrolled    Past Surgical History  Procedure Date  . Vitrectomy 12/21/2006    reports that he has never smoked. He does not have any smokeless tobacco history on file. His alcohol and drug histories not on file. family history includes Cancer in his father. No Known Allergies  Review of Systems Denies weight change    Objective:   Physical Exam GENERAL: no distress Pulses: dorsalis pedis intact bilat.   Feet: no deformity.  no ulcer on the feet.  feet are of normal color and temp.  no edema Neuro: sensation is intact to touch on the feet     Lab Results  Component Value Date   WBC 6.8 02/18/2010   HGB 15.0 02/18/2010   HCT 42.6 02/18/2010   PLT 205.0 02/18/2010   CHOL 185 07/01/2010   TRIG 163.0* 07/01/2010   HDL 38.10* 07/01/2010   LDLDIRECT 136.3 02/18/2010   ALT 21 02/18/2010   AST 23 02/18/2010   NA 139 02/18/2010   K 4.6 02/18/2010   CL 105 02/18/2010   CREATININE 1.0 02/18/2010   BUN 24* 02/18/2010   CO2 27 02/18/2010   TSH 1.90 02/18/2010   PSA 0.00* 02/18/2010   HGBA1C 8.6* 07/01/2010   MICROALBUR 5.7* 02/18/2010     Assessment & Plan:  Dm, therapy limited by noncompliance.  i'll do the best i  can. Dyslipidemia, therapy limited by noncompliance.  i'll do the best i can.

## 2010-08-26 NOTE — Patient Instructions (Addendum)
It is always good to take medications as prescribed, as they reduce your chance of hiving health problems in the future.   Please go to the lab in 3 months to repeat the diabetes and cholesterol levels.  then please call [phone] to hear your test results. Please make a physical appointment in 6 months. good diet and exercise habits significanly improve the control of your diabetes.  please let me know if you wish to be referred to a dietician.  high blood sugar is very risky to your health.  you should see an eye doctor every year. controlling your blood pressure and cholesterol drastically reduces the damage diabetes does to your body.  this also applies to quitting smoking.  please discuss these with your doctor.  you should take an aspirin every day, unless you have been advised by a doctor not to. check your blood sugar 1 time a day.  vary the time of day when you check, between before the 3 meals, and at bedtime.  also check if you have symptoms of your blood sugar being too high or too low.  please keep a record of the readings and bring it to your next appointment here.  please call us sooner if you are having low blood sugar episodes.

## 2010-09-14 NOTE — Op Note (Signed)
NAME:  Jason Zimmerman, Jason Zimmerman NO.:  [id number]   MEDICAL RECORD NO.:  [id number]          PATIENT TYPE:  INP   LOCATION:  2036                         FACILITY:  [facility]   PHYSICIAN:  Clent Demark. Rankin, M.D.   DATE OF BIRTH:  June 20, 1955   DATE OF PROCEDURE:  DATE OF DISCHARGE:                               OPERATIVE REPORT   PREOPERATIVE DIAGNOSES:  1. Endophthalmitis, endogenous left eye.  2. Hypopyon.   POSTOPERATIVE DIAGNOSES:  1. Endophthalmitis, endogenous left eye.  2. Hypopyon.  3. Subretinal abscesses, two locations, one is submacular including      the fovea and two in superior quadrant straddle the equator with      the localized retinal detachment.   PROCEDURE:  1. Posterior vitrectomy with focal laser photocoagulation to repair      retinal detachment.  2. Membrane peel, drainage of subretinal abscess.  3. Diagnostic vitreous tap and cultures.  4. Injection of intravitreal antibiotic - ceftazidime 2.25 mg/0.1 mL,      volume 0.1 cm and vancomycin 1.0 mg/0.1 cm, volume 0.1 cm.   SURGEON:  Clent Demark. Rankin, M.D.   ANESTHESIA:  General endotracheal anesthesia.   INDICATIONS FOR PROCEDURE:  The patient is a 55 year old man who has  profound visual loss in the left eye, seen earlier today by Dr.  Luberta Mutter with confirmed hypopyon where the patient can be  examined operatively and adequately.  On funduscopic, an obscured fundus  and apparently endophthalmitis.  The patient has had no recent cataract  surgeries or any other ocular procedures.  The patient is hospitalized  for an epidural abscess-MRSA as a secondary consequence to a minor blunt  trauma playing softball.  The patient understands that the  endophthalmitis in the eye is a severe circumstance putting the entire  eye and the globe at risk for complete loss with no intervention but,  even with intervention, the chances of significant impact on vision loss  even to the point of loss of all vision  and the globe is still present  given the underlying nature of the infection but also because of the  unknown sites of involvement preemptively.  The patient and the family  understood these issues.  They understand the need for surgical  intervention.  Appropriate signed consent was obtained, the patient  taken to the operating room.   In the operating room, appropriate monitoring is followed by general  endotracheal anesthesia.  The left periocular region is sterilely  prepped and draped in the usual ophthalmic fashion.  A lid speculum  applied.  A 25 gauge trocar placed into place.  Infusion was left off.  Superior trocar was applied.  Attempt with a 26-gauge needle into the  vitreous cavity to remove direct aspiration was not successful.  Thereafter, vitrectomy was then carried out and a syringe was applied to  the suction part of the vitrectomy so as to directly catch pure vitreous  specimen.  This allowed for removal of approximately 0.75 to 1 mL of  vitreous fluid, which was yellowish in nature.  Infusion at this time  was turned on.  At this time, the specimen was directly plated onto a  sheet blood agar and trial clot agar placed as well as into anaerobic  culture medium for transport.   Plans were made to keep the cassette to send down to the laboratory for  cultures, Cytospin.   At this time, vitrectomy was then carried out.  Notable findings of  anterior chamber cloudiness.  A paracentesis was fashioned and a 30-  gauge cannula was then used push-pull technique to clear the mild  anterior segment debris so as to allow visualization and has  visualization posteriorly.  At this time, vitrectomy was continued and  posterior hyaloid was identified and the vitreous skirt and the  posterior hyaloid removed off the posterior pole and anterior to the  cornea at 360 degrees.  Notable findings are immediately a submacular  white abscess formation with overlying foveal hemorrhages.   The entire  area was approximately 20-25 disk areas in size.  A separate  nonconnected subretinal abscess was also noted centered at the 12  o'clock position near the equator approximately 10 disk areas in size.  There is a spontaneous necrotic hole in the center of this and then this  allowed for drainage of the portions of the subretinal abscess  superiorly.  No attempt was made to drain the subretinal abscess in the  macular region.   At this time, fluid-air exchange was then completed so as to remove most  of the vitreous debris.   Thereafter, an air-fluid exchange was then carried out and the retina  remained attached.  Endolaser photocoagulation placed around the  superior abscess so as to wall it off to prevent extensive detachment.  No attempt was made to place permanent vitreous tamponade because of the  need to initially at this surgical intervention to sterilize the  vitreous cavity with antibiotics.   At this time, the instruments were removed from the eye.  Low infusion  pressures were left in place after the superior trocar has been removed.  Injection of ceftazidime and then vancomycin were placed into the  vitreous cavity directly. __________  antibiotics were then placed  subconjunctivally.   A sterile patch and Fox shield applied to the left eye.  The patient  tolerated the procedure without complication.  He was awakened from  anesthesia and taken to the recovery room in good and stable condition.      Clent Demark Rankin, M.D.  Electronically Signed     GAR/MEDQ  D:  12/20/2006  T:  12/20/2006  Job:  [id number]   cc:   Roselie Awkward, MD  Michel Bickers, MD  Dr. [name]

## 2010-09-14 NOTE — H&P (Signed)
NAME:  Jason Zimmerman, Jason Zimmerman NO.:  [id number]   MEDICAL RECORD NO.:  [id number]          PATIENT TYPE:  INP   LOCATION:  2115                         FACILITY:  [facility]   PHYSICIAN:  [name]. [name], MDDATE OF BIRTH:  [birth year]   DATE OF ADMISSION:  12/18/2006  DATE OF DISCHARGE:                              HISTORY & PHYSICAL   PRIMARY CARE PHYSICIAN:  [name].   CHIEF COMPLAINT:  Shock.   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  Since that time, he has had severe  intractable pain.  Prior to this, he gives a history of having some left  eye vision problems and occasional episode of blurry vision .  Now,  after the 6th of August when he was hit in the back, he continued to  have problems with severe lower back pain to the point where it would  not improve and he finally could not take it any more and came into the  emergency room on December 15, 2006.  After having several days of fever  and weakness to the point where he felt he could not even stand up, he  came to [facility] on December 15, 2006.  At that time, he  was found to be in septic shock.  CT of the abdomen and pelvis was  unremarkable, as well as a lumbar spine x-ray.  The patient was found to  have a white count reportedly around 11 or 12.  He was started on IV  fluids and blood cultures were drawn.  His urine culture looked positive  for UTI and he was started on Cipro. Over the next several days while in  the ICU, his blood pressure continued to drop, requiring dopamine, and,  while it was able to be weaned down to a low rate, could not be  completely weaned off; the patient would drop down to a systolic blood  pressure below 90, requiring him to stay on dopamine.  He continued to  have severe back pain and was unable to tolerate MRI scan secondary to  pain.  In addition, he would complain about occasional headaches and  worsening of his left-sided vision, although he felt that after his  initial injury he got some sand into his left eye.  Given the fact the  patient follows with Dr. [name] here in [place], the family  requested that the patient be transferred over to [facility].  Both family and the patient were informed by the hospitalist and  understood that while his transfer had to be approved, in addition there  was some concern about transferring the patient safety-wise to [facility] while on dopamine, but the family and patient insisted.  The  hospitalist at [facility] contacted myself, Dr. [name], on behalf  of the [facility] hospitalists and the transfer was made.  The patient was  transported approximately 2:30 to 3 a.m. to [facility] on the  2100 unit.  After he arrived, he was seen.  His blood pressure initially  was 155/103 on 5 mcg of dopamine.  I gave orders to try to wean him  down, but with attempts to wean him, when he would get down to 2.5 mcg,  his blood pressure fell to about 90.  He, himself, is doing okay.  He  complains of some left eye blurry vision, which he still feels there is  sand or grit in there, he complains of mild frontal headache.  He denies  any dysphagia.  No chest pain.  No palpitations.  No shortness of  breath, wheezing or coughing.  No abdominal pain, other than he does  complain of some lower back pain, but no bilateral leg pain, no leg  weakness, although does feel quite tired.  His review of systems is  otherwise negative.   PAST MEDICAL HISTORY:  Diabetes mellitus.   MEDICATIONS:  He is only on metformin.   ALLERGIES:  He has no known drug allergies.   SOCIAL HISTORY:  He denies any tobacco, alcohol or drug use.   FAMILY HISTORY:  Noncontributory.   PHYSICAL EXAMINATION:  VITALS ON  ADMISSION:  Heart rate 95, blood  pressure 110/57; this is 4 mcg of dopamine.  O2 SAT 99% on 2 L.  Respiratory rate of 13.  He is afebrile.  GENERAL:  He is alert and oriented x3, in no apparent distress.  HEENT:  Normocephalic, atraumatic.  His mucous membranes are slightly  dry.  His eye is inflamed.  HEART:  Regular rate and rhythm, S1 and S2.  No appreciable murmur can  be heard.  LUNGS:  Clear to auscultation bilaterally with some decreased breath  sounds at the bases.  ABDOMEN:  Soft, nontender and non-distended.  Positive bowel sounds.  EXTREMITIES:  No clubbing, cyanosis or edema.  He has no signs of  petechial hemorrhaging.  NEUROLOGIC:  He has full strength in his lower extremities, equal,  without any lack of sensation.  He is able to fully perform motor  skills.   LABORATORY WORK:  I have ordered a BMET, which is pending.  His white  count is 14.6, H&H 12.8 and 36.4, platelet count 249,000.   ASSESSMENT AND PLAN:  Septic shock.  Please note that in addition to the  patient's history, that on day of transfer his urine and blood cultures  came back positive for methicillin-resistant Staphylococcus aureus  bacteriemia and he was started on vancomycin.   1. Methicillin-resistant Staphylococcus aureus bacteriemia:  The      source actually is not likely the urine, given the fact that Staph      does not necessarily originate from the urine, and more likely he      is seeding from another source.  The biggest concern would be with      the lower back injury secondary to a softball, that perhaps he has      a collection of infection there.  He has been unable to tolerate      lying down on the MRI scanner plus, in addition, he is currently on      dopamine.  We will plan to try to wean the patient off of the      dopamine and then give him multiple narcotics to make him tolerate      the MRI scanner and look for signs of a  possible abscess.  In the      meantime, we will  continue intravenous vancomycin.  2. Diabetes mellitus.  We will put him on sliding scale, continue      orals.  3. Irritated red eye with vision changes.  We will try saline eye      drops, but in addition, with concerns of methicillin-resistant      Staphylococcus aureus bacteriemia and vision changes, we will check      a 2-D echocardiogram to rule out endocarditis.  In the meantime, we      will keep him in the unit until we are able to successfully get him      off the dopamine.  Continue with stress ulcer and deep venous      thrombosis prophylaxis as well as a spirometer.      Annita Brod, M.D.   Electronically Signed     ______________________________  [name] [name], MD   SKK/MEDQ  D:  12/18/2006  T:  12/18/2006  Job:  [id number]   cc:   Hilliard Clark A. [name], MD

## 2010-09-14 NOTE — Discharge Summary (Signed)
NAME:  Jason Zimmerman, Jason Zimmerman NO.:  [id number]   MEDICAL RECORD NO.:  [id number]          PATIENT TYPE:  INP   LOCATION:  5034                         FACILITY:  [facility]   PHYSICIAN:  [name]. [name], MDDATE OF BIRTH:  17-Jun-1955   DATE OF ADMISSION:  12/18/2006  DATE OF DISCHARGE:  12/27/2006                               DISCHARGE SUMMARY   DISCHARGE DIAGNOSES:  1. Methicillin-resistant staphylococcus aureus sepsis with diffuse      dissemination including paraspinal myositis, lumbar epidural      abscess status post I&D with drainage December 27, 2006 per Dr. Earnie Larsson, left endophthalmitis status post vitrectomy December 21, 2006      per Dr. Zadie Rhine.  2. Septic shock secondary to methicillin-resistant staphylococcus      aureus sepsis on admission  3. Anemia, multifactorial.  4. Diabetes type 2, uncontrolled with noncompliance prior to      admission.  5. Left vision loss secondary to left endophthalmitis.  6. Scrotal edema and lower extremity edema secondary to third spacing      in the setting of positive fluid balance.   HISTORY OF PRESENT ILLNESS:  Mr. Rodino is a 55 year old male who was  admitted on December 18, 2006 with shock.  He was transferred from  [facility] at family's request with hypotension requiring pressors  at time of admission.  The patient had history of noncompliance with his  diabetes medications. 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  He also reported at that time  some left eye vision problems which preceded the complaint of back pain.  There was also a report of a recent boil on his chin which had required  lancing.  He was admitted for further evaluation and treatment of septic  shock.  Cultures which were initially performed at [facility]  grew methicillin-resistant Staphylococcus aureus bacteria.   PAST MEDICAL HISTORY:  Diabetes type 2.   COURSE OF HOSPITALIZATION:  Problem #1.  Methicillin-resistant  staphylococcus aureus bacteremia/sepsis.  The patient was admitted to  the ICU at [facility].  He required pressors initially for  blood pressure support during this hospitalization.  He was continued on  IV vancomycin.  Infectious disease consult was obtained on December 19, 2006, and the patient was seen by Dr. Michel Bickers.  At that point, an  ophthalmology consult was requested, and the patient was seen by Dr.  Luberta Mutter of ophthalmology.  She felt that his ocular prognosis  was very poor, and consult was requested from a retinal specialist, and  the patient was seen by Dr. Zadie Rhine.  He subsequently underwent an  emergency vitrectomy with antibiotic injection performed by Dr. Zadie Rhine.  During this procedure, he also received laser photocoagulation to repair  a retinal detachment with membrane peel and drainage of subretinal  abscess.   Prior to this, a MRI of the brain was performed which showed question of  retinal mass on the left which  was later determined to be infectious in  nature.   Due to the patient's severe lumbar pain, an MRI of the lumbar spine was  performed which noted a massive inflammatory process involving all that  the deep paraspinal muscles and posterior medial psoas muscle to the  lumbar spine.  Widespread epidural process was noted L3-L4 through the  sacrum.  A neurosurgery consult was requested, and the patient was seen  by Dr. Earnie Larsson.  He underwent an L4-L5 decompressive laminotomy with  evacuation of epidural abscess on December 22, 2006.  Postoperatively, he  has remained stable.  Plan to continue IV vancomycin through at least  January 30, 2007.  He will need further evaluation at that time as to  whether or not to discontinue or if need needs additional treatment.   He did develop some third spacing including lower extremity edema  and  scrotal edema which improved with Lasix and should continue to improve  over the next week or so.   Problem #2.  Diabetes type 2 with history of noncompliance.  The  patient's sugars were well-controlled during this hospitalization with a  small dose of Lantus.  However, with resolution of infection, plan to  discharge the patient back to home with metformin which he had not taken  in several months.   MEDICATIONS AT TIME OF DISCHARGE:  1. Scopolamine 0.25% ophthalmic solution 1 drop OS q.i.d.  2. Zymar 0.2% ophthalmic solution 1 drop OS q.i.d.  3. Pred Forte 1% ophthalmic drops 1 drop OS q.i.d.  4. Vancomycin 1250 mg IV twice daily through January 30, 2007 via      PICC.  5. Percocet 5/325 one to two tablets p.o. q.6h. p.r.n. pain, dispense      30.  6. Flexeril 10 mg p.o. t.i.d. as needed, dispense 30.  7. Metformin 500 mg p.o. b.i.d., dispense 60 with 1 refill.   PERTINENT LABORATORY DATA:  At time of discharge, blood culture December 21, 2006 negative x2; blood culture December 19, 2006 negative.  Epidural  abscess culture December 22, 2006 positive for MRSA.  Eye culture December 20, 2006 positive for MRSA.  Blood culture December 19, 2006 positive for  MRSA.  Hemoglobin 9.2, hematocrit 26.7, BUN 5, creatinine 0.8, sodium  138, potassium 3.3.   DISPOSITION AND PLAN:  Discharge the patient to home.  We will ask home  health for assistance with IV administration of IV vancomycin.  We will  also request home health physical therapy for additional rehabilitation  at home, and at family request, we will make a referral for outpatient  diabetes education.   FOLLOW UP:  The patient is scheduled to follow up with [name]  on Tuesday, September 2 at 1:30 p.m.  He is also instructed to follow up  with Dr. Zadie Rhine in 72 days and contact the office for an appointment.  He is to follow up with Dr. Annette Stable in 2 weeks and contact his office for  an appointment.  He is instructed to  return to the ER if he develops  worsening back pain, weakness or fever over 101.      Debbrah Alar, NP      [name]. [name], MD  Electronically Signed    MO/MEDQ  D:  12/27/2006  T:  12/27/2006  Job:  [id number]   cc:   Dominica Severin A. Rankin, M.D.  Sean A. [name], MD  Cooper Render Pool, M.D.  Michel Bickers, M.D.

## 2010-09-14 NOTE — Op Note (Signed)
NAME:  BENAIAH, MIGNEAULT NO.:  [id number]   MEDICAL RECORD NO.:  [id number]          PATIENT TYPE:  INP   LOCATION:  2036                         FACILITY:  [facility]   PHYSICIAN:  Cooper Render. Pool, M.D.    DATE OF BIRTH:  January 24, 1956   DATE OF PROCEDURE:  12/22/2006  DATE OF DISCHARGE:                               OPERATIVE REPORT   PREOPERATIVE DIAGNOSIS:  L4-L5 spontaneous epidural abscess.   POSTOPERATIVE DIAGNOSIS:  L4-L5 spontaneous epidural abscess.   PROCEDURE:  Right sided L4-L5 decompressive laminotomy with evacuation  of epidural abscess.   SURGEON:  Cooper Render. Pool, M.D.   ANESTHESIA:  General endotracheal anesthesia.   INDICATIONS FOR PROCEDURE:  Mr. Filipek is a 55 year old diabetic male  who was recently admitted to the hospital in septic shock secondary to  MRSA bacteremia.  As the patient's clinical condition improved, it was  noted that he had intense back pain with radiation to the lower  extremities.  These symptoms have improved greatly upon administration  of antibiotics.  The patient has been left with persistent back pain and  some mild dorsiflexion weakness on the patient's right side.  Workup has  demonstrated evidence of an extensive paraspinal infectious process  consistent with myositis from a MRSA infection.  He has evidence of a  focal right paracentral dorsal fluid collection that is likely to be an  epidural abscess mixed with some hemorrhage.  The patient was counseled  as to his options.  He decided to proceed with a laminotomy and  evacuation of epidural abscess in hopes of improving his symptoms.  The  patient is aware of the risks and benefits including but not limited to  the risks of anesthesia, bleeding, infection, CSF leak, nerve root  injury, recurrence of infection, continued pain, and no benefit.  The  patient is also aware that his symptoms could progress to where he has  evidence of mechanical instability requiring fusion.   With this in mind,  the patient wishes to proceed with surgery in hopes of improving his  symptoms.   OPERATIVE NOTE:  The patient was taken to the operating room and placed  on the operating table in the supine position. After general  endotracheal anesthesia was achieved, the patient was positioned prone  onto the Wilson frame and appropriately padded. The patient's lumbar  regions were prepped and draped sterilely.  A 10 blade was used to make  a linear skin incision overlying what was felt to be the L4-L5  interspace.  This was carried sharply in the midline.  Subperiosteal  dissection was performed exposing the lamina and facet joints.  Deep  self-retaining retractor was placed.  Intraoperative x-ray was taken  and, in fact, the L3-L4 level was approached.  Dissection was redirected  one level caudally.  The retractor was replaced.   A laminotomy was then performed using the high speed drill and Kerrison  rongeurs to remove the inferior 2/3 of the lamina of L4, medial aspect  of the L4-L5 facet joint, and the superior 2/3 to 3/4 of the lamina of  L5.  The ligamentum  flavum was then elevated and resected in a piecemeal  fashion using Kerrison rongeurs.  A large amount of epidural pus was  encountered.  This was cultured.  The spinal canal was explored.  Decompressive foraminotomies were performed along the course of the  exiting L4 and L5 nerve roots on the right side.  The dura was gently  depressed and decompression across the midline and a sublaminar  decompression was performed using Kerrison rongeurs.  All elements of  the epidural abscess appeared to be completely resected.  At this point,  the wound was then irrigated with antibiotic solution.  A medium Hemovac  drain was left in the epidural space.  The  wound was then closed in layers with Vicryl sutures.  Steri-Strips and  sterile dressing were applied.  There were no intraoperative  complications.  The patient tolerated  the procedure well and he returns  to the recovery room postoperatively.           ______________________________  Cooper Render Pool, M.D.     HAP/MEDQ  D:  12/22/2006  T:  12/23/2006  Job:  [id number]

## 2010-09-14 NOTE — Op Note (Signed)
NAME:  Jason Zimmerman, Jason Zimmerman            ACCOUNT NO.:  [id number]   MEDICAL RECORD NO.:  [id number]          PATIENT TYPE:  AMB   LOCATION:  SDS                          FACILITY:  [facility]   PHYSICIAN:  Clent Demark. Rankin, M.D.   DATE OF BIRTH:  1955/09/02   DATE OF PROCEDURE:  DATE OF DISCHARGE:  01/15/2007                               OPERATIVE REPORT   PREOPERATIVE DIAGNOSES:  1. Rhegmatogenous detachment - left eye.  2. Intra-axial detachment left eye - PVR - stage D1, open to fundal      detachment.  3. History of endophthalmitic subretinal abscesses x2, locations which      has now resolved after previous vitrectomy and injection of      antibiotics.   POSTOPERATIVE DIAGNOSES:  1. Rhegmatogenous detachment - left eye.  2. Intra-axial detachment left eye - PVR - stage D1, open to fundal      detachment.  3. History of endophthalmitic subretinal abscesses x2, locations which      has now resolved after previous vitrectomy and injection of      antibiotics.   PROCEDURES:  1. Posterior vitrectomy and membrane peel with endolaser      photocoagulation to repair a complex retinal detachment.  2. Injection of vitreous substitute - permanent silicone oil [id number]      centistokes after gas solution and change.  3. Partial lensectomy.  4. Inferior peripheral iridectomy.   SURGEON:  Clent Demark. Rankin, M.D.   ANESTHESIA:  Local retrobulbar with mild anesthesia control.   INDICATIONS FOR PROCEDURE:  Patient is a 55 year old man with profound  vision loss, light perception vision and knows that this is an attempt  to salvage the globe and some potential for peripheral vision.  He  developed late onset retinal detachment after successful resolution of  endophthalmitis from endogenous MRSA.   He understands this is an attempt simply to salvage the globe and  potential for peripheral vision.  He understands the risk of anesthesia,  including recurrence, loss of the eye from the underlying condition  as  well as surgical repair, including but not limited to hemorrhage,  infection, scarring, need for further surgery.  No change in vision,  loss of vision, and progression of disease despite intervention.  Appropriate signed consent was obtained.  Patient was taken to the  operating room.  In the operating room, appropriate monitoring was  followed by mild sedation.  0.75% Marcaine was delivered 5 mL  retrobulbar for an additional 5 mL lateral to fashion a modified Kirk Ruths.  The left periocular region was sterilely prepped and draped in  the usual ophthalmic fashion.  A lid speculum was applied.  A 25 gauge  trocar was placed in the inferotemporal quadrant.  Superior trocar was  applied.  Conjunctiva opening was made in the superotemporal quadrant in  anticipation of fragmentation of the lens.  Placement of the infusion in  the vitreous cavity to verify visually.  Lensectomy was then carried  out, followed by removal of the remnant of the capsule with forceps.  At  this time, total fundal detachment was identified.  Scleral depressor  was then used to trim the vitreous base, which was already largely  removed.  There was some minor contracture inferiorly.  At this time,  posterior retinotomy inferior to the optic nerve was made and then fluid-  fluid exchange was then carried to remove thick subretinal fluid.  This  was followed by fluid-air exchange.  This was carried out.  The  reaccumulated fluid was removed over the next 15-20 minutes on several  occasions with a soft tip extrusion needle.  Inulase photocoagulation  placed 360 degrees around the retinal break noted at 12 o'clock in the  equator, as well as around the inferior retinotomy.   Inferior peripheral iridectomy was fashioned.  Thereafter, air-silicone  oil exchange carried out passively.  The superior sclerotomy was closed  with 7-0 Vicryl.  The two other sclerotomies with the trocars were  removed.  The infusion was  removed.  Subconjunctival Decadron applied.  Sterile patch and Fox shield applied.  Patient tolerated the procedure  without complication.  He was taken to the recovery room in good stable  condition.      Clent Demark Rankin, M.D.  Electronically Signed     GAR/MEDQ  D:  01/15/2007  T:  01/16/2007  Job:  [id number]

## 2010-09-14 NOTE — Op Note (Signed)
NAME:  Jason Zimmerman, Jason Zimmerman            ACCOUNT NO.:  [id number]   MEDICAL RECORD NO.:  [id number]          PATIENT TYPE:  AMB   LOCATION:  SDS                          FACILITY:  [facility]   PHYSICIAN:  Clent Demark. Rankin, M.D.   DATE OF BIRTH:  1955/05/03   DATE OF PROCEDURE:  05/14/2007  DATE OF DISCHARGE:  05/14/2007                               OPERATIVE REPORT   PREOPERATIVE DIAGNOSES:  1. 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  2. Proliferative vitreal retinopathy, stage C-III.  3. History of subretinal abscesses, multiple status post      endophthalmitis of an endogenous source and subsequently treated.   POSTOPERATIVE DIAGNOSES:  1. Recurrent__________ detachment, left eye secondary to number 2.  2. Proliferative vitreal retinopathy, stage C-III.  3. History of subretinal abscesses, multiple status post      endophthalmitis of an endogenous source and subsequently treated.   PROCEDURE:  1. Posterior vitrectomy with membrane peel- _epiretinal____ membrane  2. Laser photocoagulation to repair retinal complex retinal      detachment.  3. Injection of vitreous substitute-permanent-silicone oil [id number]      [place].  4. Removal of implant-oil and subsequently with replacement as      mentioned above.   SURGEON:  Clent Demark. Rankin, MD   ANESTHESIA:  Local _____mac_____ for the left eye.   INDICATIONS FOR PROCEDURE:  The patient is a 55 year old man who had  excellent restoration of vision and had the retinal re-attached, but had  developed proliferative vitreal retinopathies stimulating a re-  detachment from preretinal fibrous membranes.  The patient understands  this is an attempt to reattach the retina.  He understands this is an  attempt to maximize visual protection and visual improvement.  He  understands the risks of anesthesia __________ He also understands the  risk to the eye from the underlying condition, as well as surgical  including, but not limited to risk of infection, scarring, need for  another surgery, no change in vision, loss of vision, or progressive  disease despite intervention.  Appropriate consent was obtained.  The  patient was taken to the operating room.  In the operating room,  appropriate monitoring was followed by a mouth station.  Two-percent  Xylocaine was injected retrobulbar 5 cc with an additional 5 cc  laterally.  __________ Shelle Iron.  The left periocular region  was sterilely prepped and draped in the usual ophthalmic fashion.  A  lids speculum was applied.   In the operating room, appropriate monitoring __________ station and the  left periocular region was sterilely prepped and draped after _____local  retrobulbar anesthetic applied_____  to the eye had been anesthetized.  The infusion was placed in the infra_____temporal_____ quadrant.  Superior trocars were applied.  The supranasal trocar was replaced with  NVR blade and incision through the conjunctiva to allow for evacuation  of silicone oil.  This was carried out with extraction machine without  difficulty.  Notable findings were of essentially 270 degree detachment,  starting from approximately the 2 o'clock position and around to the 10  o'clock position.   Significant PVRs were noted overlying the macular region, as well as  inferior to the optic nerve.  These multiple membranes were removed and  were full of pigment.   Retinal holes identified and fluid exchange carried out to remove the  subretinal fluid.  Thereafter, fluid-air change completed.  Interlaced  photocoagulation placed 360 degrees.  Thereafter the fluid-air change  completed and then an __________ silicone oil change completed  passively.  The superior trocars had been removed, superior  _____sclerotomies_____ closed with 7-0 Vicryl and the other sites of the  trocar placements were also closed with 7-0 Vicryl.  Subconjunctival  Decadron  applied.  ______fox shield and patch  applied.  Retina was  nicely reattached.      Clent Demark Rankin, M.D.  Electronically Signed     GAR/MEDQ  D:  05/14/2007  T:  05/15/2007  Job:  [id number]

## 2010-09-14 NOTE — Assessment & Plan Note (Signed)
[name]                                 ON-CALL NOTE   Jason Zimmerman, Jason Zimmerman                     MRN:          [id number]  DATE:12/17/2006                            DOB:          1955-11-11    PRIMARY CARE PHYSICIAN:  Dr. [name].   CALLER:  Beola Cord, cousin of the patient.   TIME OF CALL:  3:34pm   The caller states that the patient is in [facility] because of a  baseball injury and they want permission to get transferred to [facility]. I explained that his doctor at [facility] would have to  call the doctor on call for the hospital at [facility] to arrange for a  transfer.     Rosalita Chessman, DO  Electronically Signed    Telford Nab  DD: 12/17/2006  DT: 12/18/2006  Job #: [id number]   cc:   Hilliard Clark A. [name], MD

## 2010-10-19 DIAGNOSIS — I219 Acute myocardial infarction, unspecified: Secondary | ICD-10-CM

## 2010-10-19 HISTORY — DX: Acute myocardial infarction, unspecified: I21.9

## 2010-11-10 ENCOUNTER — Other Ambulatory Visit: Payer: Self-pay

## 2010-11-10 MED ORDER — GLUCOSE BLOOD VI STRP: ORAL_STRIP | Status: DC

## 2010-11-11 ENCOUNTER — Other Ambulatory Visit: Payer: Self-pay

## 2010-11-11 MED ORDER — GLUCOSE BLOOD VI STRP: 1.0000 | ORAL_STRIP | Freq: Two times a day (BID) | Status: AC | PRN

## 2011-01-20 LAB — BASIC METABOLIC PANEL
BUN: 19
CO2: 26
Chloride: 101
Creatinine, Ser: 0.87
Glucose, Bld: 184 — ABNORMAL HIGH

## 2011-01-20 LAB — CBC
MCHC: 35.9
MCV: 93.2
Platelets: 246

## 2011-02-10 LAB — CBC
MCHC: 34.5
RBC: 3.59 — ABNORMAL LOW
WBC: 8.6

## 2011-02-10 LAB — BASIC METABOLIC PANEL
CO2: 25
Calcium: 9.3
Creatinine, Ser: 0.78
GFR calc Af Amer: >60

## 2011-02-11 LAB — BASIC METABOLIC PANEL
BUN: 13
BUN: 5 — ABNORMAL LOW
BUN: 7
CO2: 19
CO2: 25
Calcium: 7.7 — ABNORMAL LOW
Calcium: 7.9 — ABNORMAL LOW
Calcium: 8.1 — ABNORMAL LOW
Chloride: 106
Chloride: 107
Creatinine, Ser: 0.69
Creatinine, Ser: 0.8
Creatinine, Ser: 0.91
GFR calc Af Amer: >60
GFR calc Af Amer: >60
GFR calc Af Amer: >60
GFR calc Af Amer: >60
GFR calc non Af Amer: >60
GFR calc non Af Amer: >60
GFR calc non Af Amer: >60
Glucose, Bld: 111 — ABNORMAL HIGH
Potassium: 3.1 — ABNORMAL LOW
Potassium: 3.3 — ABNORMAL LOW
Potassium: 3.6
Potassium: 3.6
Sodium: 134 — ABNORMAL LOW
Sodium: 135
Sodium: 137
Sodium: 139

## 2011-02-11 LAB — CULTURE, ROUTINE-ABSCESS

## 2011-02-11 LAB — CBC
HCT: 26.3 — ABNORMAL LOW
HCT: 26.7 — ABNORMAL LOW
HCT: 31.2 — ABNORMAL LOW
HCT: 32.4 — ABNORMAL LOW
Hemoglobin: 11.2 — ABNORMAL LOW
Hemoglobin: 12.8 — ABNORMAL LOW
MCHC: 35.1
MCHC: 35.2
MCV: 95.1
MCV: 96
Platelets: 297
Platelets: 443 — ABNORMAL HIGH
Platelets: 500 — ABNORMAL HIGH
RBC: 3.38 — ABNORMAL LOW
RBC: 3.81 — ABNORMAL LOW
RDW: 12.7
WBC: 12.3 — ABNORMAL HIGH
WBC: 14.6 — ABNORMAL HIGH
WBC: 8.7

## 2011-02-11 LAB — GRAM STAIN

## 2011-02-11 LAB — ANAEROBIC CULTURE

## 2011-02-11 LAB — CULTURE, BLOOD (ROUTINE X 2)
Culture: NO GROWTH
Culture: NO GROWTH

## 2011-02-11 LAB — HEMOGLOBIN A1C: Hgb A1c MFr Bld: 10.6 — ABNORMAL HIGH

## 2011-02-11 LAB — EYE CULTURE: Culture: NO GROWTH

## 2011-02-11 LAB — SEDIMENTATION RATE: Sed Rate: 47 — ABNORMAL HIGH

## 2011-03-11 ENCOUNTER — Other Ambulatory Visit: Payer: Self-pay | Admitting: *Deleted

## 2011-03-11 MED ORDER — METFORMIN HCL ER 500 MG PO TB24: 1000.0000 mg | ORAL_TABLET | Freq: Two times a day (BID) | ORAL | Status: DC

## 2011-03-11 NOTE — Telephone Encounter (Signed)
Pt spouse called requesting refill for Metformin to [facility]. Pt spouse informed rx sent to pharmacy.

## 2011-03-14 ENCOUNTER — Other Ambulatory Visit (INDEPENDENT_AMBULATORY_CARE_PROVIDER_SITE_OTHER): Payer: BC Managed Care – PPO

## 2011-03-14 DIAGNOSIS — E119 Type 2 diabetes mellitus without complications: Secondary | ICD-10-CM

## 2011-03-14 DIAGNOSIS — E785 Hyperlipidemia, unspecified: Secondary | ICD-10-CM

## 2011-03-15 ENCOUNTER — Encounter: Payer: Self-pay | Admitting: Endocrinology

## 2011-03-15 ENCOUNTER — Ambulatory Visit (INDEPENDENT_AMBULATORY_CARE_PROVIDER_SITE_OTHER): Payer: BC Managed Care – PPO | Admitting: Endocrinology

## 2011-03-15 ENCOUNTER — Telehealth: Payer: Self-pay | Admitting: Endocrinology

## 2011-03-15 VITALS — BP 120/78 | HR 70 | Temp 98.0°F | Ht 70.0 in | Wt 162.0 lb

## 2011-03-15 DIAGNOSIS — I2510 Atherosclerotic heart disease of native coronary artery without angina pectoris: Secondary | ICD-10-CM

## 2011-03-15 DIAGNOSIS — Z23 Encounter for immunization: Secondary | ICD-10-CM

## 2011-03-15 DIAGNOSIS — E119 Type 2 diabetes mellitus without complications: Secondary | ICD-10-CM

## 2011-03-15 LAB — LIPID PANEL
HDL: 37.5 mg/dL — ABNORMAL LOW (ref >39.00)
Triglycerides: 165 mg/dL — ABNORMAL HIGH (ref 0.0–149.0)

## 2011-03-15 LAB — HEMOGLOBIN A1C: Hgb A1c MFr Bld: 6.7 % — ABNORMAL HIGH (ref 4.6–6.5)

## 2011-03-15 NOTE — Telephone Encounter (Signed)
Received 85 pages from [facility]; forwarded to Dr. [name] for review. 03/15/11-ar

## 2011-03-15 NOTE — Patient Instructions (Addendum)
please call [phone] to hear your blood test results.  You will be prompted to enter the 9-digit "MRN" number that appears at the top left of this page, followed by #.  Then you will hear the message.  Please try to stay with the januvia, as side-effects are rare. Please make a regular physical appointment in 3 months. check your blood sugar 1 time a day.  vary the time of day when you check, between before the 3 meals, and at bedtime.  also check if you have symptoms of your blood sugar being too high or too low.  please keep a record of the readings and bring it to your next appointment here.  please call us sooner if you are having low blood sugar episodes. (update: i left message on phone-tree:  rx as we discussed)

## 2011-03-15 NOTE — Progress Notes (Signed)
  Subjective:    Patient ID: Jason Zimmerman, male    DOB: 1955-10-22, 55 y.o.   MRN: [id number]  HPI He stopped the [place], due to media reports of cancer.  no cbg record, but states cbg's are well-controlled.  pt states he feels well in general. Past Medical History  Diagnosis Date  . Special screening for malignant neoplasm of prostate   . Ingrowing nail   . Routine general medical examination at a health care facility   . Acute endophthalmitis   . Unspecified osteomyelitis, site unspecified   . Other staphylococcus infection in conditions classified elsewhere and of unspecified site   . Ganglion and cyst of synovium, tendon, and bursa   . Type II or unspecified type diabetes mellitus without mention of complication, not stated as uncontrolled   . Heart attack 10/19/2010  . Prostate cancer 2011    Past Surgical History  Procedure Date  . Vitrectomy 12/21/2006    History   Social History  . Marital Status: Married    Spouse Name: N/A    Number of Children: N/A  . Years of Education: N/A   Occupational History  . Not on file.   Social History Main Topics  . Smoking status: Never Smoker   . Smokeless tobacco: Not on file  . Alcohol Use: Not on file  . Drug Use: Not on file  . Sexually Active: Not on file   Other Topics Concern  . Not on file   Social History Narrative  . No narrative on file    Current Outpatient Prescriptions on File Prior to Visit  Medication Sig Dispense Refill  . glucose blood (FREESTYLE TEST STRIPS) test strip 1 each by Other route 2 (two) times daily as needed for other. PRN  102 each  5  . metFORMIN (GLUCOPHAGE-XR) 500 MG 24 hr tablet Take 2 tablets (1,000 mg total) by mouth 2 (two) times daily.  360 tablet  1  . sitaGLIPtan (JANUVIA) 100 MG tablet Take 100 mg by mouth every morning.          No Known Allergies  Family History  Problem Relation Age of Onset  . Cancer Father     Prostate    BP 120/78  Pulse 70  Temp(Src) 98 F  (36.7 C) (Oral)  Ht 5\' 10"  (1.778 m)  Wt 162 lb (73.483 kg)  BMI 23.24 kg/m2  SpO2 98%  Review of Systems Denies weight change    Objective:   Physical Exam VITAL SIGNS:  See vs page GENERAL: no distress Pulses: dorsalis pedis intact bilat.   Feet: no deformity.  no ulcer on the feet.  feet are of normal color and temp.  no edema.  There is bilateral onychomycosis. Neuro: sensation is intact to touch on the feet.   Lab Results  Component Value Date   HGBA1C 6.7* 03/14/2011      Assessment & Plan:  Dm, Needs increased rx, if it can be done with a regimen that avoids or minimizes hypoglycemia.

## 2011-06-23 ENCOUNTER — Other Ambulatory Visit: Payer: Self-pay | Admitting: Endocrinology

## 2011-09-08 ENCOUNTER — Other Ambulatory Visit: Payer: Self-pay | Admitting: Endocrinology

## 2011-09-12 ENCOUNTER — Ambulatory Visit: Payer: BC Managed Care – PPO | Admitting: Endocrinology

## 2011-09-12 DIAGNOSIS — Z0289 Encounter for other administrative examinations: Secondary | ICD-10-CM

## 2011-12-26 ENCOUNTER — Ambulatory Visit (INDEPENDENT_AMBULATORY_CARE_PROVIDER_SITE_OTHER): Payer: BC Managed Care – PPO | Admitting: Endocrinology

## 2011-12-26 ENCOUNTER — Other Ambulatory Visit (INDEPENDENT_AMBULATORY_CARE_PROVIDER_SITE_OTHER): Payer: BC Managed Care – PPO

## 2011-12-26 ENCOUNTER — Encounter: Payer: Self-pay | Admitting: Endocrinology

## 2011-12-26 VITALS — BP 106/68 | HR 70 | Temp 96.9°F | Ht 70.0 in | Wt 159.0 lb

## 2011-12-26 DIAGNOSIS — R229 Localized swelling, mass and lump, unspecified: Secondary | ICD-10-CM

## 2011-12-26 DIAGNOSIS — I2510 Atherosclerotic heart disease of native coronary artery without angina pectoris: Secondary | ICD-10-CM

## 2011-12-26 DIAGNOSIS — N289 Disorder of kidney and ureter, unspecified: Secondary | ICD-10-CM

## 2011-12-26 DIAGNOSIS — Z1211 Encounter for screening for malignant neoplasm of colon: Secondary | ICD-10-CM | POA: Insufficient documentation

## 2011-12-26 DIAGNOSIS — E119 Type 2 diabetes mellitus without complications: Secondary | ICD-10-CM

## 2011-12-26 DIAGNOSIS — E785 Hyperlipidemia, unspecified: Secondary | ICD-10-CM

## 2011-12-26 DIAGNOSIS — Z79899 Other long term (current) drug therapy: Secondary | ICD-10-CM

## 2011-12-26 DIAGNOSIS — D649 Anemia, unspecified: Secondary | ICD-10-CM

## 2011-12-26 DIAGNOSIS — Z1212 Encounter for screening for malignant neoplasm of rectum: Secondary | ICD-10-CM | POA: Insufficient documentation

## 2011-12-26 LAB — URINALYSIS, ROUTINE W REFLEX MICROSCOPIC
Bilirubin Urine: NEGATIVE
Hgb urine dipstick: NEGATIVE
Ketones, ur: NEGATIVE
Leukocytes, UA: NEGATIVE
Specific Gravity, Urine: 1.025 (ref 1.000–1.030)
Urine Glucose: NEGATIVE
Urobilinogen, UA: 0.2 (ref 0.0–1.0)

## 2011-12-26 LAB — BASIC METABOLIC PANEL
BUN: 38 mg/dL — ABNORMAL HIGH (ref 6–23)
CO2: 21 mEq/L (ref 19–32)
Calcium: 9.5 mg/dL (ref 8.4–10.5)
GFR: 33.03 mL/min — ABNORMAL LOW (ref >60.00)
Glucose, Bld: 131 mg/dL — ABNORMAL HIGH (ref 70–99)

## 2011-12-26 LAB — CBC WITH DIFFERENTIAL/PLATELET
Basophils Absolute: 0 10*3/uL (ref 0.0–0.1)
Eosinophils Absolute: 0.3 10*3/uL (ref 0.0–0.7)
Hemoglobin: 11.5 g/dL — ABNORMAL LOW (ref 13.0–17.0)
Lymphocytes Relative: 15.2 % (ref 12.0–46.0)
Lymphs Abs: 1.4 10*3/uL (ref 0.7–4.0)
MCHC: 33.6 g/dL (ref 30.0–36.0)
MCV: 100.5 fl — ABNORMAL HIGH (ref 78.0–100.0)
Monocytes Absolute: 0.5 10*3/uL (ref 0.1–1.0)
Neutro Abs: 6.8 10*3/uL (ref 1.4–7.7)
RDW: 12.9 % (ref 11.5–14.6)

## 2011-12-26 LAB — LIPID PANEL
Cholesterol: 122 mg/dL (ref 0–200)
HDL: 35.2 mg/dL — ABNORMAL LOW (ref >39.00)
Total CHOL/HDL Ratio: 3
Triglycerides: 221 mg/dL — ABNORMAL HIGH (ref 0.0–149.0)

## 2011-12-26 LAB — HEPATIC FUNCTION PANEL
Alkaline Phosphatase: 91 U/L (ref 39–117)
Bilirubin, Direct: 0.1 mg/dL (ref 0.0–0.3)
Total Bilirubin: 0.8 mg/dL (ref 0.3–1.2)
Total Protein: 7.4 g/dL (ref 6.0–8.3)

## 2011-12-26 LAB — MICROALBUMIN / CREATININE URINE RATIO: Microalb, Ur: 7.3 mg/dL — ABNORMAL HIGH (ref 0.0–1.9)

## 2011-12-26 NOTE — Patient Instructions (Addendum)
Refer to a dermatology specialist.  you will receive a phone call, about a day and time for an appointment. blood tests are being requested for you today.  You will receive a letter with results. Please come back for a regular physical appointment in 3 months.

## 2011-12-26 NOTE — Progress Notes (Signed)
Subjective:    Patient ID: Jason Zimmerman, male    DOB: 05/28/1955, 56 y.o.   MRN: [id number]  HPI Pt returns for f/u of type 2 DM (dx'ed [id number]; complicated by cad).   Pt states few mos of a slight spot on the left foot, but no assoc pain Past Medical History  Diagnosis Date  . Special screening for malignant neoplasm of prostate   . Ingrowing nail   . Routine general medical examination at a health care facility   . Acute endophthalmitis   . Unspecified osteomyelitis, site unspecified   . Other staphylococcus infection in conditions classified elsewhere and of unspecified site   . Ganglion and cyst of synovium, tendon, and bursa   . Type II or unspecified type diabetes mellitus without mention of complication, not stated as uncontrolled   . Heart attack 10/19/2010  . Prostate cancer 2011    Past Surgical History  Procedure Date  . Vitrectomy 12/21/2006    History   Social History  . Marital Status: Married    Spouse Name: N/A    Number of Children: N/A  . Years of Education: N/A   Occupational History  . Not on file.   Social History Main Topics  . Smoking status: Never Smoker   . Smokeless tobacco: Not on file  . Alcohol Use: Not on file  . Drug Use: Not on file  . Sexually Active: Not on file   Other Topics Concern  . Not on file   Social History Narrative  . No narrative on file    Current Outpatient Prescriptions on File Prior to Visit  Medication Sig Dispense Refill  . acetaminophen (TYLENOL) 650 MG CR tablet Take 650 mg by mouth every 6 (six) hours as needed.        Marland Kitchen aspirin EC 81 MG tablet Take 81 mg by mouth daily.        . carvedilol (COREG) 3.125 MG tablet Take 3.125 mg by mouth every 12 (twelve) hours.        Marland Kitchen glucose blood (FREESTYLE TEST STRIPS) test strip 1 each by Other route 2 (two) times daily as needed for other. PRN  102 each  5  . JANUVIA 100 MG tablet TAKE 1 TABLET BY MOUTH EVERY MORNING  90 tablet  2  . lisinopril (PRINIVIL,ZESTRIL)  2.5 MG tablet Take 2.5 mg by mouth every 12 (twelve) hours.        . metFORMIN (GLUCOPHAGE-XR) 500 MG 24 hr tablet TAKE 2 TABLETS BY MOUTH TWICE DAILY  360 tablet  1  . nitroGLYCERIN (NITROSTAT) 0.4 MG SL tablet Place 0.4 mg under the tongue every 5 (five) minutes as needed. Up to 3 doses       . Ticagrelor (BRILINTA) 90 MG TABS tablet Take 90 mg by mouth 2 (two) times daily.          No Known Allergies  Family History  Problem Relation Age of Onset  . Cancer Father     Prostate    BP 106/68  Pulse 70  Temp 96.9 F (36.1 C) (Oral)  Ht 5\' 10"  (1.778 m)  Wt 159 lb (72.122 kg)  BMI 22.81 kg/m2  SpO2 99%  Review of Systems Denies weight change.  He denies any bleeding from the spot on his foot    Objective:   Physical Exam Pulses: dorsalis pedis intact bilat.   Feet: no deformity.  no ulcer on the feet.  feet are of normal color and temp, but  there is a 1 cm pigmented spot on the instep of the left foot.  no edema. Neuro: sensation is intact to touch on the feet  Lab Results  Component Value Date   HGBA1C 6.8* 12/26/2011      Assessment & Plan:  DM, well-controlled Skin nodule, new, uncertain etiology

## 2011-12-27 ENCOUNTER — Encounter: Payer: Self-pay | Admitting: Endocrinology

## 2011-12-27 DIAGNOSIS — N289 Disorder of kidney and ureter, unspecified: Secondary | ICD-10-CM | POA: Insufficient documentation

## 2011-12-27 DIAGNOSIS — D649 Anemia, unspecified: Secondary | ICD-10-CM | POA: Insufficient documentation

## 2011-12-27 DIAGNOSIS — Z862 Personal history of diseases of the blood and blood-forming organs and certain disorders involving the immune mechanism: Secondary | ICD-10-CM | POA: Insufficient documentation

## 2011-12-29 ENCOUNTER — Telehealth: Payer: Self-pay | Admitting: *Deleted

## 2011-12-29 NOTE — Telephone Encounter (Signed)
Called pt to inform of lab results, pt informed (letter also mailed to pt). 

## 2012-01-04 ENCOUNTER — Other Ambulatory Visit: Payer: Self-pay | Admitting: Endocrinology

## 2012-01-04 ENCOUNTER — Other Ambulatory Visit (INDEPENDENT_AMBULATORY_CARE_PROVIDER_SITE_OTHER): Payer: BC Managed Care – PPO

## 2012-01-04 ENCOUNTER — Encounter: Payer: Self-pay | Admitting: Endocrinology

## 2012-01-04 DIAGNOSIS — D649 Anemia, unspecified: Secondary | ICD-10-CM

## 2012-01-04 DIAGNOSIS — N289 Disorder of kidney and ureter, unspecified: Secondary | ICD-10-CM

## 2012-01-04 LAB — CBC WITH DIFFERENTIAL/PLATELET
Basophils Absolute: 0 10*3/uL (ref 0.0–0.1)
Eosinophils Absolute: 0.1 10*3/uL (ref 0.0–0.7)
Hemoglobin: 11 g/dL — ABNORMAL LOW (ref 13.0–17.0)
Lymphocytes Relative: 15.5 % (ref 12.0–46.0)
Lymphs Abs: 0.7 10*3/uL (ref 0.7–4.0)
MCHC: 34.4 g/dL (ref 30.0–36.0)
MCV: 99.9 fl (ref 78.0–100.0)
Monocytes Absolute: 0.6 10*3/uL (ref 0.1–1.0)
Neutro Abs: 3 10*3/uL (ref 1.4–7.7)
RDW: 12.6 % (ref 11.5–14.6)

## 2012-01-04 LAB — BASIC METABOLIC PANEL
CO2: 21 mEq/L (ref 19–32)
Calcium: 9 mg/dL (ref 8.4–10.5)
Chloride: 108 mEq/L (ref 96–112)
Glucose, Bld: 152 mg/dL — ABNORMAL HIGH (ref 70–99)
Sodium: 135 mEq/L (ref 135–145)

## 2012-01-04 LAB — IBC PANEL: Saturation Ratios: 21.3 % (ref 20.0–50.0)

## 2012-01-20 ENCOUNTER — Other Ambulatory Visit: Payer: Self-pay | Admitting: Surgery

## 2012-02-09 ENCOUNTER — Other Ambulatory Visit: Payer: Self-pay | Admitting: Surgery

## 2012-04-03 ENCOUNTER — Encounter: Payer: Self-pay | Admitting: Endocrinology

## 2012-04-03 ENCOUNTER — Ambulatory Visit (INDEPENDENT_AMBULATORY_CARE_PROVIDER_SITE_OTHER): Payer: BC Managed Care – PPO | Admitting: Endocrinology

## 2012-04-03 VITALS — BP 124/68 | HR 61 | Temp 97.5°F | Wt 163.0 lb

## 2012-04-03 DIAGNOSIS — D649 Anemia, unspecified: Secondary | ICD-10-CM

## 2012-04-03 DIAGNOSIS — N289 Disorder of kidney and ureter, unspecified: Secondary | ICD-10-CM

## 2012-04-03 DIAGNOSIS — E119 Type 2 diabetes mellitus without complications: Secondary | ICD-10-CM

## 2012-04-03 LAB — BASIC METABOLIC PANEL
CO2: 23 mEq/L (ref 19–32)
Calcium: 9.4 mg/dL (ref 8.4–10.5)
Chloride: 109 mEq/L (ref 96–112)
Glucose, Bld: 160 mg/dL — ABNORMAL HIGH (ref 70–99)
Sodium: 142 mEq/L (ref 135–145)

## 2012-04-03 LAB — CBC WITH DIFFERENTIAL/PLATELET
Basophils Relative: 0 % (ref 0–1)
Eosinophils Absolute: 0.4 10*3/uL (ref 0.0–0.7)
Eosinophils Relative: 5 % (ref 0–5)
Lymphs Abs: 1.7 10*3/uL (ref 0.7–4.0)
MCH: 33.5 pg (ref 26.0–34.0)
MCHC: 35.1 g/dL (ref 30.0–36.0)
MCV: 95.6 fL (ref 78.0–100.0)
Monocytes Relative: 6 % (ref 3–12)
Neutrophils Relative %: 66 % (ref 43–77)
Platelets: 168 10*3/uL (ref 150–400)
RBC: 3.4 MIL/uL — ABNORMAL LOW (ref 4.22–5.81)

## 2012-04-03 LAB — VITAMIN B12: Vitamin B-12: 406 pg/mL (ref 211–911)

## 2012-04-03 LAB — IBC PANEL: UIBC: 179 ug/dL (ref 125–400)

## 2012-04-03 LAB — HEMOGLOBIN A1C: Hgb A1c MFr Bld: 7.1 % — ABNORMAL HIGH (ref <5.7)

## 2012-04-03 NOTE — Patient Instructions (Addendum)
blood tests are being requested for you today.  We'll contact you with results. Here are some samples of "tradjenta."  Take 1 per day, instead of januvia.  If you learn of a cheaper alternative to [place], please call, and we'll change it.  Please come back for a follow-up appointment in 3 months.   If the kidneys are still off, we'll need to find an alternative to the metformin, such as actos.

## 2012-04-03 NOTE — Progress Notes (Signed)
Subjective:    Patient ID: Jason Zimmerman, male    DOB: 11-Jun-1955, 56 y.o.   MRN: [id number]  HPI The state of at least three ongoing medical problems is addressed today, with interval history of each noted here: Pt returns for f/u of type 2 DM (dx'ed [id number]; complicated by cad).  Pt says cbg's are in the high-100's since he stopped the [place] (due to very high cost).   Renal insuff: Pt says he is on the waiting list for a nephrology appt. pt states he feels well in general. Anemia:  He denies brbpr. Past Medical History  Diagnosis Date  . Special screening for malignant neoplasm of prostate   . Ingrowing nail   . Routine general medical examination at a health care facility   . Acute endophthalmitis   . Unspecified osteomyelitis, site unspecified   . Other staphylococcus infection in conditions classified elsewhere and of unspecified site   . Ganglion and cyst of synovium, tendon, and bursa(727.4)   . Type II or unspecified type diabetes mellitus without mention of complication, not stated as uncontrolled   . Heart attack 10/19/2010  . Prostate cancer 2011    Past Surgical History  Procedure Date  . Vitrectomy 12/21/2006    History   Social History  . Marital Status: Married    Spouse Name: N/A    Number of Children: N/A  . Years of Education: N/A   Occupational History  . Not on file.   Social History Main Topics  . Smoking status: Never Smoker   . Smokeless tobacco: Not on file  . Alcohol Use: Not on file  . Drug Use: Not on file  . Sexually Active: Not on file   Other Topics Concern  . Not on file   Social History Narrative  . No narrative on file    Current Outpatient Prescriptions on File Prior to Visit  Medication Sig Dispense Refill  . acetaminophen (TYLENOL) 650 MG CR tablet Take 650 mg by mouth every 6 (six) hours as needed.        Marland Kitchen aspirin EC 81 MG tablet Take 81 mg by mouth daily.        . carvedilol (COREG) 3.125 MG tablet Take 3.125 mg by mouth  every 12 (twelve) hours.        Marland Kitchen glucose blood (FREESTYLE TEST STRIPS) test strip 1 each by Other route 2 (two) times daily as needed for other. PRN  102 each  5  . lisinopril (PRINIVIL,ZESTRIL) 2.5 MG tablet Take 2.5 mg by mouth every 12 (twelve) hours.        . nitroGLYCERIN (NITROSTAT) 0.4 MG SL tablet Place 0.4 mg under the tongue every 5 (five) minutes as needed. Up to 3 doses       . Ticagrelor (BRILINTA) 90 MG TABS tablet Take 90 mg by mouth 2 (two) times daily.        Marland Kitchen JANUVIA 100 MG tablet TAKE 1 TABLET BY MOUTH EVERY MORNING  90 tablet  2  . pioglitazone (ACTOS) 30 MG tablet Take 1 tablet (30 mg total) by mouth daily.  30 tablet  11    No Known Allergies  Family History  Problem Relation Age of Onset  . Cancer Father     Prostate    BP 124/68  Pulse 61  Temp 97.5 F (36.4 C) (Oral)  Wt 163 lb (73.936 kg)  SpO2 98%   Review of Systems denies hypoglycemia and hematuria.  Objective:   Physical Exam VITAL SIGNS:  See vs page GENERAL: no distress Ext: no edema   Lab Results  Component Value Date   WBC 7.2 04/03/2012   HGB 11.4* 04/03/2012   HCT 32.5* 04/03/2012   PLT 168 04/03/2012   GLUCOSE 160* 04/03/2012   CHOL 122 12/26/2011   TRIG 221.0* 12/26/2011   HDL 35.20* 12/26/2011   LDLDIRECT 52.1 12/26/2011   LDLCALC 66 03/14/2011   ALT 25 12/26/2011   AST 24 12/26/2011   NA 142 04/03/2012   K 4.7 04/03/2012   CL 109 04/03/2012   CREATININE 2.37* 04/03/2012   BUN 32* 04/03/2012   CO2 23 04/03/2012   TSH 1.57 12/26/2011   PSA 0.00* 02/18/2010   HGBA1C 7.1* 04/03/2012   MICROALBUR 7.3* 12/26/2011      Assessment & Plan:  DM, Needs increased rx, if it can be done with a regimen that avoids or minimizes hypoglycemia. renal insuff, persistent, uncertain etiology Anemia is mild, and probably due to renal insuff

## 2012-04-04 MED ORDER — PIOGLITAZONE HCL 30 MG PO TABS: 30.0000 mg | ORAL_TABLET | Freq: Every day | ORAL | Status: DC

## 2012-04-10 ENCOUNTER — Telehealth: Payer: Self-pay

## 2012-04-10 NOTE — Telephone Encounter (Signed)
Pt calling regarding recent med changes, he is taking actos 30mg  and trajenta (samples) once daily on each and his sugars are gradually increasing was running 130's-160's now creeping up in the 160's-200's range.  Wants to know if he should increase his meds any.

## 2012-04-10 NOTE — Telephone Encounter (Signed)
Pt advised and states an understanding 

## 2012-04-10 NOTE — Telephone Encounter (Signed)
The actos takes time to work. Please call if high blood sugar persists

## 2012-07-03 ENCOUNTER — Ambulatory Visit: Payer: BC Managed Care – PPO | Admitting: Endocrinology

## 2012-07-09 ENCOUNTER — Ambulatory Visit (INDEPENDENT_AMBULATORY_CARE_PROVIDER_SITE_OTHER): Payer: BC Managed Care – PPO | Admitting: Endocrinology

## 2012-07-09 ENCOUNTER — Encounter: Payer: Self-pay | Admitting: Endocrinology

## 2012-07-09 VITALS — BP 126/72 | HR 66 | Wt 179.0 lb

## 2012-07-09 DIAGNOSIS — E119 Type 2 diabetes mellitus without complications: Secondary | ICD-10-CM

## 2012-07-09 MED ORDER — BROMOCRIPTINE MESYLATE 2.5 MG PO TABS: 2.5000 mg | ORAL_TABLET | Freq: Every day | ORAL | Status: DC

## 2012-07-09 NOTE — Progress Notes (Signed)
Subjective:    Patient ID: Jason Zimmerman, male    DOB: 1956/03/23, 57 y.o.   MRN: [id number]  HPI Pt returns for f/u of type 2 DM (dx'ed [id number]; complicated by cad and renal insufficiency).  no cbg record, but states cbg's are approx 200.  pt states he feels well in general, except for weight gain. Past Medical History  Diagnosis Date  . Special screening for malignant neoplasm of prostate   . Ingrowing nail   . Routine general medical examination at a health care facility   . Acute endophthalmitis   . Unspecified osteomyelitis, site unspecified   . Other staphylococcus infection in conditions classified elsewhere and of unspecified site   . Ganglion and cyst of synovium, tendon, and bursa(727.4)   . Type II or unspecified type diabetes mellitus without mention of complication, not stated as uncontrolled   . Heart attack 10/19/2010  . Prostate cancer 2011    Past Surgical History  Procedure Laterality Date  . Vitrectomy  12/21/2006    History   Social History  . Marital Status: Married    Spouse Name: N/A    Number of Children: N/A  . Years of Education: N/A   Occupational History  . Not on file.   Social History Main Topics  . Smoking status: Never Smoker   . Smokeless tobacco: Not on file  . Alcohol Use: Not on file  . Drug Use: Not on file  . Sexually Active: Not on file   Other Topics Concern  . Not on file   Social History Narrative  . No narrative on file    Current Outpatient Prescriptions on File Prior to Visit  Medication Sig Dispense Refill  . acetaminophen (TYLENOL) 650 MG CR tablet Take 650 mg by mouth every 6 (six) hours as needed.        Marland Kitchen aspirin EC 81 MG tablet Take 81 mg by mouth daily.        . carvedilol (COREG) 3.125 MG tablet Take 3.125 mg by mouth every 12 (twelve) hours.        Marland Kitchen glucose blood (FREESTYLE TEST STRIPS) test strip 1 each by Other route 2 (two) times daily as needed for other. PRN  102 each  5  . JANUVIA 100 MG tablet TAKE 1  TABLET BY MOUTH EVERY MORNING  90 tablet  2  . lisinopril (PRINIVIL,ZESTRIL) 2.5 MG tablet Take 2.5 mg by mouth every 12 (twelve) hours.        . nitroGLYCERIN (NITROSTAT) 0.4 MG SL tablet Place 0.4 mg under the tongue every 5 (five) minutes as needed. Up to 3 doses       . pioglitazone (ACTOS) 30 MG tablet Take 1 tablet (30 mg total) by mouth daily.  30 tablet  11  . Ticagrelor (BRILINTA) 90 MG TABS tablet Take 90 mg by mouth 2 (two) times daily.         No current facility-administered medications on file prior to visit.    No Known Allergies  Family History  Problem Relation Age of Onset  . Cancer Father     Prostate    BP 126/72  Pulse 66  Wt 179 lb (81.194 kg)  BMI 25.68 kg/m2  SpO2 97%  Review of Systems denies hypoglycemia.    Objective:   Physical Exam VITAL SIGNS:  See vs page GENERAL: no distress Pulses: dorsalis pedis intact bilat.   Feet: no deformity.  no ulcer on the feet.  feet are of normal  color and temp, but there is a 1 cm pigmented spot on the instep of the left foot (healed surgical site).  no edema.  There is bilateral onychomycosis.  Neuro: sensation is intact to touch on the feet.    Lab Results  Component Value Date   HGBA1C 9.0* 07/09/2012      Assessment & Plan:  DM: needs increased rx

## 2012-07-09 NOTE — Patient Instructions (Addendum)
blood tests are being requested for you today.  We'll contact you with results. Here are some samples of "tradjenta."  Take 1 per day, instead of januvia.  If you learn of a cheaper alternative to [place], please call, and we'll change it.  Please come back for a follow-up appointment in 3 months.  Please add "bromocriptine," to help your blood sugar. It has possible side effects of nausea and dizziness.  These go away with time.  You can avoid these by taking it at bedtime, and by taking just take 1/2 pill for the first week.   check your blood sugar once a day.  vary the time of day when you check, between before the 3 meals, and at bedtime.  also check if you have symptoms of your blood sugar being too high or too low.  please keep a record of the readings and bring it to your next appointment here.  please call us sooner if your blood sugar goes below 70, or if you have a lot of readings over 200.

## 2012-10-09 ENCOUNTER — Ambulatory Visit: Payer: BC Managed Care – PPO | Admitting: Endocrinology

## 2012-10-09 DIAGNOSIS — Z0289 Encounter for other administrative examinations: Secondary | ICD-10-CM

## 2012-12-06 ENCOUNTER — Telehealth: Payer: Self-pay

## 2012-12-06 NOTE — Telephone Encounter (Signed)
Pt left voicemail stating he got samples and now needs rx sent to [facility] in [place], please advise [phone]

## 2012-12-10 ENCOUNTER — Other Ambulatory Visit: Payer: Self-pay

## 2012-12-10 MED ORDER — SITAGLIPTIN PHOSPHATE 100 MG PO TABS: ORAL_TABLET | ORAL | Status: DC

## 2012-12-10 NOTE — Telephone Encounter (Signed)
Please refill januvia x 1 Ov is due

## 2013-03-30 ENCOUNTER — Other Ambulatory Visit: Payer: Self-pay | Admitting: Endocrinology

## 2013-04-01 ENCOUNTER — Other Ambulatory Visit: Payer: Self-pay | Admitting: *Deleted

## 2013-04-01 MED ORDER — PIOGLITAZONE HCL 30 MG PO TABS: 30.0000 mg | ORAL_TABLET | Freq: Every day | ORAL | Status: DC

## 2013-08-02 ENCOUNTER — Other Ambulatory Visit: Payer: Self-pay | Admitting: Endocrinology

## 2013-09-09 ENCOUNTER — Other Ambulatory Visit: Payer: Self-pay | Admitting: Endocrinology

## 2014-04-21 ENCOUNTER — Other Ambulatory Visit: Payer: Self-pay | Admitting: Endocrinology

## 2014-05-20 ENCOUNTER — Other Ambulatory Visit: Payer: Self-pay | Admitting: Endocrinology

## 2014-06-14 ENCOUNTER — Other Ambulatory Visit: Payer: Self-pay | Admitting: Endocrinology

## 2015-12-24 DIAGNOSIS — E1122 Type 2 diabetes mellitus with diabetic chronic kidney disease: Secondary | ICD-10-CM | POA: Diagnosis not present

## 2015-12-24 DIAGNOSIS — E782 Mixed hyperlipidemia: Secondary | ICD-10-CM | POA: Diagnosis not present

## 2015-12-24 DIAGNOSIS — Z794 Long term (current) use of insulin: Secondary | ICD-10-CM | POA: Diagnosis not present

## 2015-12-24 DIAGNOSIS — N183 Chronic kidney disease, stage 3 (moderate): Secondary | ICD-10-CM | POA: Diagnosis not present

## 2016-01-01 DIAGNOSIS — N184 Chronic kidney disease, stage 4 (severe): Secondary | ICD-10-CM | POA: Diagnosis not present

## 2016-01-01 DIAGNOSIS — I129 Hypertensive chronic kidney disease with stage 1 through stage 4 chronic kidney disease, or unspecified chronic kidney disease: Secondary | ICD-10-CM | POA: Diagnosis not present

## 2016-01-12 DIAGNOSIS — E113591 Type 2 diabetes mellitus with proliferative diabetic retinopathy without macular edema, right eye: Secondary | ICD-10-CM | POA: Diagnosis not present

## 2016-01-12 DIAGNOSIS — E113393 Type 2 diabetes mellitus with moderate nonproliferative diabetic retinopathy without macular edema, bilateral: Secondary | ICD-10-CM | POA: Diagnosis not present

## 2016-01-12 DIAGNOSIS — H2702 Aphakia, left eye: Secondary | ICD-10-CM | POA: Diagnosis not present

## 2016-01-12 DIAGNOSIS — H5442 Blindness, left eye, normal vision right eye: Secondary | ICD-10-CM | POA: Diagnosis not present

## 2016-01-29 DIAGNOSIS — E1122 Type 2 diabetes mellitus with diabetic chronic kidney disease: Secondary | ICD-10-CM | POA: Diagnosis not present

## 2016-01-29 DIAGNOSIS — I252 Old myocardial infarction: Secondary | ICD-10-CM | POA: Diagnosis not present

## 2016-01-29 DIAGNOSIS — I2510 Atherosclerotic heart disease of native coronary artery without angina pectoris: Secondary | ICD-10-CM | POA: Diagnosis not present

## 2016-02-28 DIAGNOSIS — Z23 Encounter for immunization: Secondary | ICD-10-CM | POA: Diagnosis not present

## 2016-04-11 DIAGNOSIS — N2581 Secondary hyperparathyroidism of renal origin: Secondary | ICD-10-CM | POA: Diagnosis not present

## 2016-04-11 DIAGNOSIS — I129 Hypertensive chronic kidney disease with stage 1 through stage 4 chronic kidney disease, or unspecified chronic kidney disease: Secondary | ICD-10-CM | POA: Diagnosis not present

## 2016-04-11 DIAGNOSIS — D631 Anemia in chronic kidney disease: Secondary | ICD-10-CM | POA: Diagnosis not present

## 2016-04-11 DIAGNOSIS — N184 Chronic kidney disease, stage 4 (severe): Secondary | ICD-10-CM | POA: Diagnosis not present

## 2016-07-05 DIAGNOSIS — N189 Chronic kidney disease, unspecified: Secondary | ICD-10-CM | POA: Diagnosis not present

## 2016-07-05 DIAGNOSIS — D631 Anemia in chronic kidney disease: Secondary | ICD-10-CM | POA: Diagnosis not present

## 2016-07-05 DIAGNOSIS — N184 Chronic kidney disease, stage 4 (severe): Secondary | ICD-10-CM | POA: Diagnosis not present

## 2016-07-05 DIAGNOSIS — N2581 Secondary hyperparathyroidism of renal origin: Secondary | ICD-10-CM | POA: Diagnosis not present

## 2016-07-05 DIAGNOSIS — I129 Hypertensive chronic kidney disease with stage 1 through stage 4 chronic kidney disease, or unspecified chronic kidney disease: Secondary | ICD-10-CM | POA: Diagnosis not present

## 2016-07-28 DIAGNOSIS — H2702 Aphakia, left eye: Secondary | ICD-10-CM | POA: Diagnosis not present

## 2016-07-28 DIAGNOSIS — H4720 Unspecified optic atrophy: Secondary | ICD-10-CM | POA: Diagnosis not present

## 2016-07-28 DIAGNOSIS — H3342 Traction detachment of retina, left eye: Secondary | ICD-10-CM | POA: Diagnosis not present

## 2016-07-28 DIAGNOSIS — H359 Unspecified retinal disorder: Secondary | ICD-10-CM | POA: Diagnosis not present

## 2016-07-28 DIAGNOSIS — E113391 Type 2 diabetes mellitus with moderate nonproliferative diabetic retinopathy without macular edema, right eye: Secondary | ICD-10-CM | POA: Diagnosis not present

## 2016-08-05 ENCOUNTER — Ambulatory Visit (INDEPENDENT_AMBULATORY_CARE_PROVIDER_SITE_OTHER): Payer: BLUE CROSS/BLUE SHIELD | Admitting: Sports Medicine

## 2016-08-05 ENCOUNTER — Ambulatory Visit (INDEPENDENT_AMBULATORY_CARE_PROVIDER_SITE_OTHER): Payer: BLUE CROSS/BLUE SHIELD

## 2016-08-05 ENCOUNTER — Encounter: Payer: Self-pay | Admitting: Sports Medicine

## 2016-08-05 DIAGNOSIS — E119 Type 2 diabetes mellitus without complications: Secondary | ICD-10-CM | POA: Diagnosis not present

## 2016-08-05 DIAGNOSIS — M722 Plantar fascial fibromatosis: Secondary | ICD-10-CM | POA: Diagnosis not present

## 2016-08-05 MED ORDER — MELOXICAM 15 MG PO TABS: 15.0000 mg | ORAL_TABLET | Freq: Every day | ORAL | 0 refills | Status: DC

## 2016-08-05 NOTE — Progress Notes (Signed)
   Subjective:    Patient ID: Jason Zimmerman, male    DOB: [birth year], 61 y.o.   MRN: [id number]  HPI   I have some right heel pain and has been going on for about 6 months and hurts in the am or if I have sat for a while and I am very active and I am a diabetic type 2 and had a heart attack back in 2012    Review of Systems  All other systems reviewed and are negative.      Objective:   Physical Exam        Assessment & Plan:

## 2016-08-05 NOTE — Progress Notes (Signed)
Subjective: Jason Zimmerman is a 61 y.o. male patient presents to office with complaint of heel pain on the right. Patient admits to post static dyskinesia for 6 months in duration very mild in nature 2/10 as a soreness to the heel. Patient has treated this problem with changing shoes with no relief. 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 Denies any other pedal complaints.   Patient Active Problem List   Diagnosis Date Noted  . Renal insufficiency 12/27/2011  . Anemia 12/27/2011  . Skin nodule 12/26/2011  . Encounter for long-term (current) use of other medications 12/26/2011  . CAD (coronary artery disease) 03/15/2011  . ADENOCARCINOMA, PROSTATE 02/25/2010  . INGROWN TOENAIL 07/31/2007  . METHICILLIN RESISTANT STAPHYLOCOCCUS AUREUS INFECTION 01/24/2007  . ENDOPHTHALMITIS, ACUTE 01/24/2007  . OSTEOMYELITIS 01/24/2007  . DIABETES MELLITUS, TYPE II 01/02/2007  . HYPERLIPIDEMIA 01/02/2007    Current Outpatient Prescriptions on File Prior to Visit  Medication Sig Dispense Refill  . acetaminophen (TYLENOL) 650 MG CR tablet Take 650 mg by mouth every 6 (six) hours as needed.      Marland Kitchen aspirin EC 81 MG tablet Take 81 mg by mouth daily.      . bromocriptine (PARLODEL) 2.5 MG tablet Take 1 tablet (2.5 mg total) by mouth at bedtime. 30 tablet 11  . carvedilol (COREG) 3.125 MG tablet Take 3.125 mg by mouth every 12 (twelve) hours.      Marland Kitchen glucose blood (FREESTYLE TEST STRIPS) test strip 1 each by Other route 2 (two) times daily as needed for other. PRN 102 each 5  . lisinopril (PRINIVIL,ZESTRIL) 2.5 MG tablet Take 2.5 mg by mouth every 12 (twelve) hours.      . nitroGLYCERIN (NITROSTAT) 0.4 MG SL tablet Place 0.4 mg under the tongue every 5 (five) minutes as needed. Up to 3 doses     . pioglitazone (ACTOS) 30 MG tablet Take 1 tablet by mouth daily. *APPOINTMENT NEEDED FOR FURTHER REFILLS* 30 tablet 0  . sitaGLIPtin (JANUVIA) 100 MG tablet TAKE 1 TABLET BY MOUTH EVERY  MORNING (Patient not taking: Reported on 08/05/2016) 90 tablet 0  . Ticagrelor (BRILINTA) 90 MG TABS tablet Take 90 mg by mouth 2 (two) times daily.       No current facility-administered medications on file prior to visit.     No Known Allergies  Objective: Physical Exam General: The patient is alert and oriented x3 in no acute distress.  Dermatology: Skin is warm, dry and supple bilateral lower extremities. Nails 1-10 are normal. There is no erythema, edema, no eccymosis, no open lesions present. Integument is otherwise unremarkable.  Vascular: Dorsalis Pedis pulse and Posterior Tibial pulse are 2/4 bilateral. Capillary fill time is immediate to all digits.  Neurological: Grossly intact to light touch with an achilles reflex of +2/5 and a  negative Tinel's sign, protective sensation intact bilateral.  Musculoskeletal: Minimal Tenderness to palpation at the medial calcaneal tubercale and through the insertion of the plantar fascia on the right foot. No pain with compression of calcaneus bilateral. No pain with tuning fork to calcaneus bilateral. No pain with calf compression bilateral. There is decreased Ankle joint range of motion bilateral. All other joints range of motion within normal limits bilateral. Strength 5/5 in all groups bilateral. Pes planus foot type.   Xray, Right foot:  Normal osseous mineralization. Joint spaces preserved except midfoot where there is a breach suggestive of pes planus. No fracture/dislocation/boney destruction. No Calcaneal spur present with mild thickening of plantar  fascia. No other soft tissue abnormalities or radiopaque foreign bodies.   Assessment and Plan: Problem List Items Addressed This Visit    None    Visit Diagnoses    Plantar fasciitis of right foot    -  Primary   Relevant Medications   meloxicam (MOBIC) 15 MG tablet   Other Relevant Orders   DG Foot Complete Right   Diabetes mellitus without complication (HCC)       Relevant  Medications   Insulin Glargine (LANTUS SOLOSTAR) 100 UNIT/ML Solostar Pen      -Complete examination performed.  -Xrays reviewed -Discussed with patient in detail the condition of plantar fasciitis, how this occurs and general treatment options. Explained both conservative and surgical treatments.  -No Injection this visit due to minimal symptoms  -Rx Meloxicam to take as instructed  -Recommended good supportive shoes and advised use of OTC insert. Explained to patient that if these orthoses work well, we will continue with these. If these do not improve his condition and  pain, we will consider custom molded orthoses. - Explained in detail the use of the fascial brace for right which was dispensed at today's visit. -Explained and dispensed to patient daily stretching exercises. -Recommend patient to ice affected area 1-2x daily. -Patient to return to office in 4 weeks for follow up or sooner if problems or questions arise.  Landis Martins, DPM

## 2016-08-05 NOTE — Patient Instructions (Signed)

## 2016-08-29 ENCOUNTER — Other Ambulatory Visit: Payer: Self-pay | Admitting: Sports Medicine

## 2016-08-29 DIAGNOSIS — M722 Plantar fascial fibromatosis: Secondary | ICD-10-CM

## 2016-09-02 ENCOUNTER — Ambulatory Visit: Payer: BLUE CROSS/BLUE SHIELD | Admitting: Sports Medicine

## 2016-09-02 DIAGNOSIS — E559 Vitamin D deficiency, unspecified: Secondary | ICD-10-CM | POA: Diagnosis not present

## 2016-09-16 ENCOUNTER — Ambulatory Visit: Payer: BLUE CROSS/BLUE SHIELD | Admitting: Sports Medicine

## 2016-12-02 DIAGNOSIS — D631 Anemia in chronic kidney disease: Secondary | ICD-10-CM | POA: Diagnosis not present

## 2016-12-02 DIAGNOSIS — N184 Chronic kidney disease, stage 4 (severe): Secondary | ICD-10-CM | POA: Diagnosis not present

## 2016-12-02 DIAGNOSIS — N2581 Secondary hyperparathyroidism of renal origin: Secondary | ICD-10-CM | POA: Diagnosis not present

## 2016-12-02 DIAGNOSIS — I129 Hypertensive chronic kidney disease with stage 1 through stage 4 chronic kidney disease, or unspecified chronic kidney disease: Secondary | ICD-10-CM | POA: Diagnosis not present

## 2017-01-25 DIAGNOSIS — E113391 Type 2 diabetes mellitus with moderate nonproliferative diabetic retinopathy without macular edema, right eye: Secondary | ICD-10-CM | POA: Diagnosis not present

## 2017-01-25 DIAGNOSIS — H2511 Age-related nuclear cataract, right eye: Secondary | ICD-10-CM | POA: Diagnosis not present

## 2017-01-25 DIAGNOSIS — H33059 Total retinal detachment, unspecified eye: Secondary | ICD-10-CM | POA: Diagnosis not present

## 2017-01-25 DIAGNOSIS — H31009 Unspecified chorioretinal scars, unspecified eye: Secondary | ICD-10-CM | POA: Diagnosis not present

## 2017-01-25 DIAGNOSIS — H3342 Traction detachment of retina, left eye: Secondary | ICD-10-CM | POA: Diagnosis not present

## 2017-01-26 DIAGNOSIS — N183 Chronic kidney disease, stage 3 (moderate): Secondary | ICD-10-CM | POA: Diagnosis not present

## 2017-01-26 DIAGNOSIS — E782 Mixed hyperlipidemia: Secondary | ICD-10-CM | POA: Diagnosis not present

## 2017-01-26 DIAGNOSIS — E1122 Type 2 diabetes mellitus with diabetic chronic kidney disease: Secondary | ICD-10-CM | POA: Diagnosis not present

## 2017-01-26 DIAGNOSIS — Z794 Long term (current) use of insulin: Secondary | ICD-10-CM | POA: Diagnosis not present

## 2017-01-29 DIAGNOSIS — S62302A Unspecified fracture of third metacarpal bone, right hand, initial encounter for closed fracture: Secondary | ICD-10-CM | POA: Diagnosis not present

## 2017-01-29 DIAGNOSIS — M79642 Pain in left hand: Secondary | ICD-10-CM | POA: Diagnosis not present

## 2017-01-29 DIAGNOSIS — W2103XA Struck by baseball, initial encounter: Secondary | ICD-10-CM | POA: Diagnosis not present

## 2017-01-29 DIAGNOSIS — M79602 Pain in left arm: Secondary | ICD-10-CM | POA: Diagnosis not present

## 2017-01-29 DIAGNOSIS — S62301A Unspecified fracture of second metacarpal bone, left hand, initial encounter for closed fracture: Secondary | ICD-10-CM | POA: Diagnosis not present

## 2017-01-29 DIAGNOSIS — S62309A Unspecified fracture of unspecified metacarpal bone, initial encounter for closed fracture: Secondary | ICD-10-CM | POA: Diagnosis not present

## 2017-02-01 DIAGNOSIS — S62303A Unspecified fracture of third metacarpal bone, left hand, initial encounter for closed fracture: Secondary | ICD-10-CM | POA: Diagnosis not present

## 2017-02-14 DIAGNOSIS — S62303A Unspecified fracture of third metacarpal bone, left hand, initial encounter for closed fracture: Secondary | ICD-10-CM | POA: Diagnosis not present

## 2017-02-17 DIAGNOSIS — S62321A Displaced fracture of shaft of second metacarpal bone, left hand, initial encounter for closed fracture: Secondary | ICD-10-CM | POA: Diagnosis not present

## 2017-02-21 DIAGNOSIS — Z23 Encounter for immunization: Secondary | ICD-10-CM | POA: Diagnosis not present

## 2017-02-23 DIAGNOSIS — Y999 Unspecified external cause status: Secondary | ICD-10-CM | POA: Diagnosis not present

## 2017-02-23 DIAGNOSIS — S62321A Displaced fracture of shaft of second metacarpal bone, left hand, initial encounter for closed fracture: Secondary | ICD-10-CM | POA: Diagnosis not present

## 2017-02-23 DIAGNOSIS — G8918 Other acute postprocedural pain: Secondary | ICD-10-CM | POA: Diagnosis not present

## 2017-02-23 DIAGNOSIS — X58XXXA Exposure to other specified factors, initial encounter: Secondary | ICD-10-CM | POA: Diagnosis not present

## 2017-03-07 DIAGNOSIS — S62321D Displaced fracture of shaft of second metacarpal bone, left hand, subsequent encounter for fracture with routine healing: Secondary | ICD-10-CM | POA: Diagnosis not present

## 2017-03-07 DIAGNOSIS — M25642 Stiffness of left hand, not elsewhere classified: Secondary | ICD-10-CM | POA: Diagnosis not present

## 2017-03-15 DIAGNOSIS — M25642 Stiffness of left hand, not elsewhere classified: Secondary | ICD-10-CM | POA: Diagnosis not present

## 2017-03-21 DIAGNOSIS — M25642 Stiffness of left hand, not elsewhere classified: Secondary | ICD-10-CM | POA: Diagnosis not present

## 2017-03-28 DIAGNOSIS — M25642 Stiffness of left hand, not elsewhere classified: Secondary | ICD-10-CM | POA: Diagnosis not present

## 2017-03-30 DIAGNOSIS — S62321D Displaced fracture of shaft of second metacarpal bone, left hand, subsequent encounter for fracture with routine healing: Secondary | ICD-10-CM | POA: Diagnosis not present

## 2017-03-30 DIAGNOSIS — I2510 Atherosclerotic heart disease of native coronary artery without angina pectoris: Secondary | ICD-10-CM | POA: Diagnosis not present

## 2017-03-30 DIAGNOSIS — E785 Hyperlipidemia, unspecified: Secondary | ICD-10-CM | POA: Diagnosis not present

## 2017-03-30 DIAGNOSIS — N183 Chronic kidney disease, stage 3 (moderate): Secondary | ICD-10-CM | POA: Diagnosis not present

## 2017-04-04 DIAGNOSIS — M25642 Stiffness of left hand, not elsewhere classified: Secondary | ICD-10-CM | POA: Diagnosis not present

## 2017-04-12 DIAGNOSIS — M25642 Stiffness of left hand, not elsewhere classified: Secondary | ICD-10-CM | POA: Diagnosis not present

## 2017-04-17 DIAGNOSIS — M25642 Stiffness of left hand, not elsewhere classified: Secondary | ICD-10-CM | POA: Diagnosis not present

## 2017-04-27 DIAGNOSIS — S62321D Displaced fracture of shaft of second metacarpal bone, left hand, subsequent encounter for fracture with routine healing: Secondary | ICD-10-CM | POA: Diagnosis not present

## 2017-05-01 DIAGNOSIS — M25642 Stiffness of left hand, not elsewhere classified: Secondary | ICD-10-CM | POA: Diagnosis not present

## 2017-05-08 DIAGNOSIS — N189 Chronic kidney disease, unspecified: Secondary | ICD-10-CM | POA: Diagnosis not present

## 2017-05-08 DIAGNOSIS — I129 Hypertensive chronic kidney disease with stage 1 through stage 4 chronic kidney disease, or unspecified chronic kidney disease: Secondary | ICD-10-CM | POA: Diagnosis not present

## 2017-05-08 DIAGNOSIS — D631 Anemia in chronic kidney disease: Secondary | ICD-10-CM | POA: Diagnosis not present

## 2017-05-08 DIAGNOSIS — N2581 Secondary hyperparathyroidism of renal origin: Secondary | ICD-10-CM | POA: Diagnosis not present

## 2017-05-08 DIAGNOSIS — D509 Iron deficiency anemia, unspecified: Secondary | ICD-10-CM | POA: Diagnosis not present

## 2017-05-08 DIAGNOSIS — N184 Chronic kidney disease, stage 4 (severe): Secondary | ICD-10-CM | POA: Diagnosis not present

## 2017-05-16 DIAGNOSIS — S62321D Displaced fracture of shaft of second metacarpal bone, left hand, subsequent encounter for fracture with routine healing: Secondary | ICD-10-CM | POA: Diagnosis not present

## 2017-05-26 DIAGNOSIS — S62300D Unspecified fracture of second metacarpal bone, right hand, subsequent encounter for fracture with routine healing: Secondary | ICD-10-CM | POA: Diagnosis not present

## 2017-05-26 DIAGNOSIS — S62321D Displaced fracture of shaft of second metacarpal bone, left hand, subsequent encounter for fracture with routine healing: Secondary | ICD-10-CM | POA: Diagnosis not present

## 2017-06-27 DIAGNOSIS — S62321D Displaced fracture of shaft of second metacarpal bone, left hand, subsequent encounter for fracture with routine healing: Secondary | ICD-10-CM | POA: Diagnosis not present

## 2017-09-05 DIAGNOSIS — I129 Hypertensive chronic kidney disease with stage 1 through stage 4 chronic kidney disease, or unspecified chronic kidney disease: Secondary | ICD-10-CM | POA: Diagnosis not present

## 2017-09-05 DIAGNOSIS — N184 Chronic kidney disease, stage 4 (severe): Secondary | ICD-10-CM | POA: Diagnosis not present

## 2017-09-05 DIAGNOSIS — N2581 Secondary hyperparathyroidism of renal origin: Secondary | ICD-10-CM | POA: Diagnosis not present

## 2017-09-05 DIAGNOSIS — D631 Anemia in chronic kidney disease: Secondary | ICD-10-CM | POA: Diagnosis not present

## 2017-10-23 DIAGNOSIS — H31009 Unspecified chorioretinal scars, unspecified eye: Secondary | ICD-10-CM | POA: Diagnosis not present

## 2017-10-23 DIAGNOSIS — H2511 Age-related nuclear cataract, right eye: Secondary | ICD-10-CM | POA: Diagnosis not present

## 2017-10-23 DIAGNOSIS — H33059 Total retinal detachment, unspecified eye: Secondary | ICD-10-CM | POA: Diagnosis not present

## 2017-10-23 DIAGNOSIS — H3561 Retinal hemorrhage, right eye: Secondary | ICD-10-CM | POA: Diagnosis not present

## 2017-10-23 DIAGNOSIS — H3342 Traction detachment of retina, left eye: Secondary | ICD-10-CM | POA: Diagnosis not present

## 2017-10-23 DIAGNOSIS — H2702 Aphakia, left eye: Secondary | ICD-10-CM | POA: Diagnosis not present

## 2017-10-23 DIAGNOSIS — E113311 Type 2 diabetes mellitus with moderate nonproliferative diabetic retinopathy with macular edema, right eye: Secondary | ICD-10-CM | POA: Diagnosis not present

## 2017-10-31 DIAGNOSIS — H3342 Traction detachment of retina, left eye: Secondary | ICD-10-CM | POA: Diagnosis not present

## 2017-10-31 DIAGNOSIS — E113311 Type 2 diabetes mellitus with moderate nonproliferative diabetic retinopathy with macular edema, right eye: Secondary | ICD-10-CM | POA: Diagnosis not present

## 2017-10-31 DIAGNOSIS — H33059 Total retinal detachment, unspecified eye: Secondary | ICD-10-CM | POA: Diagnosis not present

## 2017-10-31 DIAGNOSIS — H3561 Retinal hemorrhage, right eye: Secondary | ICD-10-CM | POA: Diagnosis not present

## 2017-12-14 DIAGNOSIS — N2581 Secondary hyperparathyroidism of renal origin: Secondary | ICD-10-CM | POA: Diagnosis not present

## 2017-12-14 DIAGNOSIS — N184 Chronic kidney disease, stage 4 (severe): Secondary | ICD-10-CM | POA: Diagnosis not present

## 2017-12-20 ENCOUNTER — Encounter: Payer: Self-pay | Admitting: Gastroenterology

## 2018-01-02 ENCOUNTER — Other Ambulatory Visit: Payer: Self-pay

## 2018-01-02 ENCOUNTER — Ambulatory Visit (INDEPENDENT_AMBULATORY_CARE_PROVIDER_SITE_OTHER): Payer: BLUE CROSS/BLUE SHIELD | Admitting: Gastroenterology

## 2018-01-02 ENCOUNTER — Encounter (INDEPENDENT_AMBULATORY_CARE_PROVIDER_SITE_OTHER): Payer: Self-pay

## 2018-01-02 VITALS — BP 112/66 | HR 61 | Ht 70.0 in | Wt 194.0 lb

## 2018-01-02 DIAGNOSIS — R1310 Dysphagia, unspecified: Secondary | ICD-10-CM

## 2018-01-02 DIAGNOSIS — Z1211 Encounter for screening for malignant neoplasm of colon: Secondary | ICD-10-CM | POA: Diagnosis not present

## 2018-01-02 DIAGNOSIS — K219 Gastro-esophageal reflux disease without esophagitis: Secondary | ICD-10-CM | POA: Diagnosis not present

## 2018-01-02 DIAGNOSIS — Z7982 Long term (current) use of aspirin: Secondary | ICD-10-CM

## 2018-01-02 DIAGNOSIS — Z794 Long term (current) use of insulin: Secondary | ICD-10-CM

## 2018-01-02 DIAGNOSIS — Z1212 Encounter for screening for malignant neoplasm of rectum: Secondary | ICD-10-CM

## 2018-01-02 DIAGNOSIS — E1121 Type 2 diabetes mellitus with diabetic nephropathy: Secondary | ICD-10-CM

## 2018-01-02 MED ORDER — SOD PICOSULFATE-MAG OX-CIT ACD 10-3.5-12 MG-GM -GM/160ML PO SOLN: 1.0000 | ORAL | 0 refills | Status: DC

## 2018-01-02 NOTE — Patient Instructions (Signed)
If you are age 64 or older, your body mass index should be between 23-30. Your Body mass index is 27.84 kg/m. If this is out of the aforementioned range listed, please consider follow up with your Primary Care Provider.  If you are age 12 or younger, your body mass index should be between 19-25. Your Body mass index is 27.84 kg/m. If this is out of the aformentioned range listed, please consider follow up with your Primary Care Provider.   You have been scheduled for an endoscopy and colonoscopy. Please follow the written instructions given to you at your visit today. Please pick up your prep supplies at the pharmacy within the next 1-3 days. If you use inhalers (even only as needed), please bring them with you on the day of your procedure. Your physician has requested that you go to www.startemmi.com and enter the access code given to you at your visit today. This web site gives a general overview about your procedure. However, you should still follow specific instructions given to you by our office regarding your preparation for the procedure.  You will be contacted by our office prior to your procedure for directions on holding your Aspirin.  If you do not hear from our office 1 week prior to your scheduled procedure, please call [phone] to discuss.   It was a pleasure to see you today!  Vito Cirigliano, D.O.

## 2018-01-02 NOTE — Progress Notes (Signed)
Chief Complaint: Dysphasia, reflux, CRC screening   Referring Provider: Self       HPI:     Jason Zimmerman is a 62 y.o. male referred to the [facility]astro[facility]ology [facility] for evaluation of dysphagia.  Sxs have been ongoing for the last 5-7 months or so. Points to lower sternal border. Has had eating modifications to include stopping in meals to stretch and walk to help food pass vs regurgitate back. Has had to regurgitate recently ingested foods.  No history of food impactions requiring emergent endoscopic intervention.  Solids worse than liquids but has happened with liquids alone. Has now become anxious to eat in fornt of people because of sxs. Has lost a few lbs due to food avoidances. No coughing, aspiration.  No fever, chills, night sweats.  No overt GI blood loss.  Mother with similar sxs requiring EGD with dilations.   He has a hx of reflux and previoulsy treated with pantoprazole, which had controlled reflux symptoms.  This was was stopped approx 1 year ago by his nephrologist due to CKD stage IV and known PPI renal side effects.  Unfortunately he had subsequent worseneing of index reflux sxs.  Index symptoms include heartburn, regurgitation.  Reflux sxs worse at night despite HOB elevation, often having to sleep upright in chair. Takes Zantac prn, but not as effective as PPI had been. Sxs worse when eating late at night, which she tries to avoid as much as possible.  Also due to [facility] screening. Last colonoscopy was 2007 and notable for small rectal hyperplastic polyp.   He otherwise has a complex medical history to include diabetes, complicated by diabetic nephropathy, retinopathy, CAD including STEMI with cardiac arrest in 2012 requiring DES placement and long-term aspirin therapy, history of prostate cancer, anemia of chronic disease, hypertension.  He was recently seen by his nephrologist in May 2019 Placido Sou, MD at [facility]).  Labs at that time  notable for creatinine 3.26 with EGFR 19, normal liver enzymes (aside from ALP 138), normal albumin.  Past Medical History:  Diagnosis Date  . Acute endophthalmitis   . Anemia associated with chronic renal failure   . Chronic kidney disease, stage 4 (severe) ([place])   . Coronary disease   . Diabetic retinopathy ([place])   . Ganglion and cyst of synovium, tendon, and bursa   . Heart attack ([place]) 10/19/2010  . Hyperlipidemia   . Hypertension, renal   . Ingrowing nail   . Iron (Fe) deficiency anemia   . Other staphylococcus infection in conditions classified elsewhere and of unspecified site   . Prostate cancer ([place]) 2011  . Routine general medical examination at a health care facility   . RTA (renal tubular acidosis)   . Secondary hyperparathyroidism, renal ([place])   . Special screening for malignant neoplasm of prostate   . Type II or unspecified type diabetes mellitus without mention of complication, not stated as uncontrolled   . Unspecified osteomyelitis, site unspecified   . Vitamin D deficiency      Past Surgical History:  Procedure Laterality Date  . APPENDECTOMY    . BACK SURGERY    . CARDIAC VALVE SURGERY  2012   Stenbt placed  . NASAL SEPTUM SURGERY  1981  . PROSTATE SURGERY    . VITRECTOMY  12/21/2006   Family History  Problem Relation Age of Onset  . Cancer Father        Prostate  .  Heart disease Father   . Kidney cancer Maternal Grandmother    Social History   Tobacco Use  . Smoking status: Never Smoker  . Smokeless tobacco: Never Used  Substance Use Topics  . Alcohol use: Not Currently  . Drug use: Never   Current Outpatient Medications  Medication Sig Dispense Refill  . acetaminophen (TYLENOL) 650 MG CR tablet Take 650 mg by mouth every 6 (six) hours as needed.      Marland Kitchen aspirin EC 81 MG tablet Take 81 mg by mouth daily.      Marland Kitchen atorvastatin (LIPITOR) 80 MG tablet Take 80 mg by mouth daily.    . carvedilol (COREG) 3.125 MG tablet Take 3.125 mg by mouth every  12 (twelve) hours.      Marland Kitchen glucose blood (FREESTYLE TEST STRIPS) test strip 1 each by Other route 2 (two) times daily as needed for other. PRN 102 each 5  . Insulin Glargine (LANTUS SOLOSTAR) 100 UNIT/ML Solostar Pen Inject into the skin.    Marland Kitchen nitroGLYCERIN (NITROSTAT) 0.4 MG SL tablet Place 0.4 mg under the tongue every 5 (five) minutes as needed. Up to 3 doses     . pioglitazone (ACTOS) 30 MG tablet Take 1 tablet by mouth daily. *APPOINTMENT NEEDED FOR FURTHER REFILLS* 30 tablet 0  . ranitidine (ZANTAC) 150 MG tablet Take 150 mg by mouth as needed for heartburn.    . sodium bicarbonate 650 MG tablet Take 1,300 mg by mouth 2 (two) times daily.     No current facility-administered medications for this visit.    No Known Allergies   Review of Systems: All systems reviewed and negative except where noted in HPI.     Physical Exam:    Wt Readings from Last 3 Encounters:  01/02/18 194 lb (88 kg)  07/09/12 179 lb (81.2 kg)  04/03/12 163 lb (73.9 kg)    BP 112/66   Pulse 61   Ht '5\' 10"'$  (1.778 m)   Wt 194 lb (88 kg)   BMI 27.84 kg/m  Constitutional:  Pleasant, in no acute distress. Psychiatric: Normal mood and affect. Behavior is normal. EENT: Pupils normal.  Conjunctivae are normal. No scleral icterus. Neck supple. No cervical LAD. Cardiovascular: Normal rate, regular rhythm. No edema Pulmonary/chest: Effort normal and breath sounds normal. No wheezing, rales or rhonchi. Abdominal: Soft, nondistended, nontender. Bowel sounds active throughout. There are no masses palpable. No hepatomegaly. Neurological: Alert and oriented to person place and time. Skin: Skin is warm and dry. No rashes noted.   ASSESSMENT AND PLAN;   [name] is a 62 y.o. male with multiple medical problems including diabetes, CAD with DES to LAD (2012), antiplatelet therapy, history of prostate cancer, presenting with progressive dysphasia, worsening reflux symptoms and for routine colon cancer  screening/polyp surveillance.  1) Dysphagia Symptoms seem most consistent with reflux induced stricture but will certainly evaluate for alternate etiology with EGD with dilation and/or biopsies as appropriate. - EGD with dilation - ASA hold. Will send message to Cardiologist to request clearance to hold ASA 81 mg for anticipated dilation  - If no etiology, discussed further eval with EM pending endoscopic findings  2) GERD - EGD to eval for EE, LES laxity, hiatal hernia - Stopped PPI due to low eGFR. He would like to hold off on high dose H2RA at this time in favor of evaluating for EE with EGD as above. Also briefly discussed the possibility of antireflux surgery (ie, partial Nissen, TIF, etc) and can discuss  further pending endoscopic findings, although he is appropriately reluctant to pursue aggressive intervention based on his own co-morbidities  3) Hx of CAD with PCI requiring long term ASA - Requesting clearance from Cardiologist for ASA hold for anticipated esophageal dilation  4) CRC screening - Due for routine, average risk CRC screening - Scheduled Colonoscopy with EGD as above  5) DM with nephropathy, retinopathy, CAD: - Given elevated ASA and co-morbidities, he carries elevated peri-operative risks and therefore will schedule to be done at [facility]   - Not presenting with clinical evidence of gastroparesis, but will evaluate for residual food debris suggestive of impaired motility at time of EGD as above.  The indications, risks, and benefits of EGD and colonoscopy were explained to the patient in detail. Risks include but are not limited to bleeding, perforation, adverse reaction to medications, and cardiopulmonary compromise. Sequelae include but are not limited to the possibility of surgery, hositalization, and mortality. The patient verbalized understanding and wished to proceed. All questions answered, referred to scheduler and bowel prep ordered. Further  recommendations pending results of the exam.     Lavena Bullion, DO, FACG  01/02/2018, 10:58 AM   Renato Shin, MD

## 2018-01-04 ENCOUNTER — Telehealth: Payer: Self-pay

## 2018-01-04 NOTE — Telephone Encounter (Signed)
[facility] Pre-operative Risk Assessment     Request for surgical clearance:     Endoscopy Procedure  What type of surgery is being performed?     Colonoscopy/EGD with Dil  When is this surgery scheduled?     02/13/2018 9:45am  What type of clearance is required ?   Pharmacy  Are there any medications that need to be held prior to surgery and how long? Aspirin 351m  Practice name and name of physician performing surgery?      [facility]  What is your office phone and fax number?      Phone- [phone] Fax-[phone] Anesthesia type (None, local, MAC, general) ?       MAC

## 2018-01-05 ENCOUNTER — Telehealth: Payer: Self-pay | Admitting: Gastroenterology

## 2018-01-05 NOTE — Progress Notes (Signed)
Changed Colonoscopy prep to Miralax due to patients Renal Insufficiency. Called the patient to notify him of change in directions and prep. Also sent Cardiac Clearance to Dr. Quillian Quince.

## 2018-01-05 NOTE — Telephone Encounter (Signed)
    Chart reviewed as part of pre-operative protocol coverage.    This patient never seen by [facility] provider. Seem patients follows by [facility] cardiology. Please see notes in Care Everywhere.   I will route this recommendation to the requesting party via Epic fax function and remove from pre-op pool.  Please call with questions.  [place], [place] 01/05/2018, 9:03 AM

## 2018-02-05 ENCOUNTER — Telehealth: Payer: Self-pay

## 2018-02-05 NOTE — Telephone Encounter (Signed)
Notified patient to hold ASA 325mg  for 5 days prior to his procedure per Dolan Amen, DO Patient verbalized understanding of order.

## 2018-02-08 ENCOUNTER — Other Ambulatory Visit: Payer: Self-pay

## 2018-02-08 ENCOUNTER — Encounter (HOSPITAL_COMMUNITY): Payer: Self-pay | Admitting: *Deleted

## 2018-02-13 ENCOUNTER — Ambulatory Visit (HOSPITAL_COMMUNITY): Payer: BLUE CROSS/BLUE SHIELD | Admitting: Anesthesiology

## 2018-02-13 ENCOUNTER — Other Ambulatory Visit: Payer: Self-pay

## 2018-02-13 ENCOUNTER — Encounter (HOSPITAL_COMMUNITY): Payer: Self-pay

## 2018-02-13 ENCOUNTER — Encounter (HOSPITAL_COMMUNITY)
Admission: RE | Disposition: A | Discharge status: Home / Self Care | Payer: Self-pay | Source: Ambulatory Visit | Attending: Gastroenterology

## 2018-02-13 ENCOUNTER — Ambulatory Visit (HOSPITAL_COMMUNITY)
Admission: RE | Admit: 2018-02-13 | Discharge: 2018-02-13 | Disposition: A | Discharge status: Home / Self Care | Payer: BLUE CROSS/BLUE SHIELD | Source: Ambulatory Visit | Attending: Gastroenterology | Admitting: Gastroenterology

## 2018-02-13 DIAGNOSIS — K621 Rectal polyp: Secondary | ICD-10-CM | POA: Diagnosis not present

## 2018-02-13 DIAGNOSIS — K219 Gastro-esophageal reflux disease without esophagitis: Secondary | ICD-10-CM

## 2018-02-13 DIAGNOSIS — K222 Esophageal obstruction: Secondary | ICD-10-CM

## 2018-02-13 DIAGNOSIS — K2980 Duodenitis without bleeding: Secondary | ICD-10-CM | POA: Diagnosis not present

## 2018-02-13 DIAGNOSIS — K2970 Gastritis, unspecified, without bleeding: Secondary | ICD-10-CM | POA: Diagnosis not present

## 2018-02-13 DIAGNOSIS — R1310 Dysphagia, unspecified: Secondary | ICD-10-CM

## 2018-02-13 DIAGNOSIS — Z1211 Encounter for screening for malignant neoplasm of colon: Secondary | ICD-10-CM

## 2018-02-13 DIAGNOSIS — K2990 Gastroduodenitis, unspecified, without bleeding: Secondary | ICD-10-CM

## 2018-02-13 DIAGNOSIS — K210 Gastro-esophageal reflux disease with esophagitis: Secondary | ICD-10-CM | POA: Insufficient documentation

## 2018-02-13 DIAGNOSIS — Z1212 Encounter for screening for malignant neoplasm of rectum: Secondary | ICD-10-CM

## 2018-02-13 DIAGNOSIS — K3189 Other diseases of stomach and duodenum: Secondary | ICD-10-CM | POA: Insufficient documentation

## 2018-02-13 HISTORY — PX: BIOPSY: SHX5522

## 2018-02-13 HISTORY — PX: COLONOSCOPY WITH PROPOFOL: SHX5780

## 2018-02-13 HISTORY — PX: ESOPHAGOGASTRODUODENOSCOPY (EGD) WITH PROPOFOL: SHX5813

## 2018-02-13 HISTORY — PX: ESOPHAGEAL DILATION: SHX303

## 2018-02-13 LAB — GLUCOSE, CAPILLARY: GLUCOSE-CAPILLARY: 192 mg/dL — AB (ref 70–99)

## 2018-02-13 SURGERY — COLONOSCOPY WITH PROPOFOL
Anesthesia: Monitor Anesthesia Care

## 2018-02-13 MED ORDER — PROPOFOL 10 MG/ML IV BOLUS
INTRAVENOUS | Status: AC
  Filled 2018-02-13: qty 60

## 2018-02-13 MED ORDER — LACTATED RINGERS IV SOLN: INTRAVENOUS | Status: DC

## 2018-02-13 MED ORDER — SUCRALFATE 1 GM/10ML PO SUSP: 1.0000 g | Freq: Four times a day (QID) | ORAL | 1 refills | Status: DC

## 2018-02-13 MED ORDER — SODIUM CHLORIDE 0.9 % IV SOLN
INTRAVENOUS | Status: DC
  Administered 2018-02-13: 500 mL via INTRAVENOUS

## 2018-02-13 MED ORDER — LIDOCAINE 2% (20 MG/ML) 5 ML SYRINGE
INTRAMUSCULAR | Status: DC | PRN
  Administered 2018-02-13: 100 mg via INTRAVENOUS

## 2018-02-13 MED ORDER — SODIUM CHLORIDE 0.9 % IV SOLN
INTRAVENOUS | Status: DC
  Administered 2018-02-13: 08:00:00 via INTRAVENOUS

## 2018-02-13 MED ORDER — PROPOFOL 10 MG/ML IV BOLUS
INTRAVENOUS | Status: DC | PRN
  Administered 2018-02-13 (×3): 30 mg via INTRAVENOUS
  Administered 2018-02-13: 10 mg via INTRAVENOUS

## 2018-02-13 MED ORDER — PROPOFOL 500 MG/50ML IV EMUL
INTRAVENOUS | Status: DC | PRN
  Administered 2018-02-13: 125 ug/kg/min via INTRAVENOUS

## 2018-02-13 SURGICAL SUPPLY — 25 items

## 2018-02-13 NOTE — Anesthesia Procedure Notes (Signed)
Procedure Name: MAC Date/Time: 02/13/2018 9:02 AM Performed by: West Pugh, CRNA Pre-anesthesia Checklist: Patient identified, Emergency Drugs available, Suction available, Patient being monitored and Timeout performed Patient Re-evaluated:Patient Re-evaluated prior to induction Oxygen Delivery Method: Nasal cannula Preoxygenation: Pre-oxygenation with 100% oxygen Induction Type: IV induction Number of attempts: 1 Placement Confirmation: positive ETCO2 and breath sounds checked- equal and bilateral Dental Injury: Teeth and Oropharynx as per pre-operative assessment

## 2018-02-13 NOTE — Discharge Instructions (Signed)

## 2018-02-13 NOTE — Op Note (Signed)
[facility] Patient Name: Jason Zimmerman Procedure Date: 02/13/2018 MRN: [id number] Attending MD: Gerrit Heck , MD Date of Birth: 08/21/1955 CSN: [id number] Age: 62 Admit Type: Outpatient Procedure:                Upper GI endoscopy Indications:              Dysphagia, Heartburn, Esophageal reflux,                            Regurgitation Providers:                Gerrit Heck, MD, Vista Lawman, RN, Cletis Athens,                            Technician Referring MD:              Medicines:                Monitored Anesthesia Care Complications:            No immediate complications. Estimated Blood Loss:     Estimated blood loss was minimal. Procedure:                Pre-Anesthesia Assessment:                           - Prior to the procedure, a History and Physical                            was performed, and patient medications and                            allergies were reviewed. The patient's tolerance of                            previous anesthesia was also reviewed. The risks                            and benefits of the procedure and the sedation                            options and risks were discussed with the patient.                            All questions were answered, and informed consent                            was obtained. Prior Anticoagulants: The patient has                            taken aspirin, last dose was 5 days prior to                            procedure. ASA Grade Assessment: III - A patient                            with severe  systemic disease. After reviewing the                            risks and benefits, the patient was deemed in                            satisfactory condition to undergo the procedure.                           After obtaining informed consent, the endoscope was                            passed under direct vision. Throughout the                            procedure, the patient's blood  pressure, pulse, and                            oxygen saturations were monitored continuously. The                            GIF-H190 ([id number] Olympus adult endoscope was                            introduced through the mouth, and advanced to the                            second part of duodenum. The upper GI endoscopy was                            accomplished without difficulty. The patient                            tolerated the procedure well. Scope In: Scope Out: Findings:      One benign-appearing, intrinsic moderate stenosis was found 39 cm from       the incisors. This stenosis measured 1 cm (in length). The stenosis was       traversed. A TTS dilator was passed through the scope. Dilation with a       12-13.5-15 mm balloon dilator was performed to 15 mm. The dilation site       was examined and showed mild mucosal disruption with appropriate mucosal       rent, consistent with successful balloon dilation. Estimated blood loss       was minimal.      LA Grade B (one or more mucosal breaks greater than 5 mm, not extending       between the tops of two mucosal folds) esophagitis with no bleeding was       found 35 to 39 cm from the incisors.      The gastroesophageal flap valve was visualized endoscopically and       classified as Hill Grade III (minimal fold, loose to endoscope, hiatal       hernia likely).      Localized mild inflammation characterized by erythema was found in the       gastric antrum. Biopsies  were taken with a cold forceps for Helicobacter       pylori testing. Estimated blood loss was minimal.      The gastric fundus, gastric body and pylorus were normal.      Localized mildly erythematous mucosa without active bleeding and with no       stigmata of bleeding was found in the duodenal bulb. Biopsies were taken       with a cold forceps for histology. Estimated blood loss was minimal.      The second portion of the duodenum was normal. Impression:                - Benign-appearing esophageal stenosis. Dilated                            with a 15 mm TTS balloon with appropriate mucosal                            rent formation.                           - LA Grade B reflux esophagitis.                           - Gastroesophageal flap valve classified as Hill                            Grade III (minimal fold, loose to endoscope, hiatal                            hernia likely).                           - Gastritis. Biopsied.                           - Normal gastric fundus, gastric body and pylorus.                           - Erythematous duodenopathy. Biopsied.                           - Normal second portion of the duodenum. Moderate Sedation:      N/A- Per Anesthesia Care Recommendation:           - Patient has a contact number available for                            emergencies. The signs and symptoms of potential                            delayed complications were discussed with the                            patient. Return to normal activities tomorrow.                            Written discharge instructions were  provided to the                            patient.                           - Soft diet today then proceed to previous diet as                            tolerated tomorrow.                           - Continue present medications.                           - Await pathology results.                           - Return to GI clinic in 2 months.                           - Recommend restarting acid suppression agent both                            for mucosal healing as well as treatment of                            underlying reflux.                           - Use sucralfate suspension 1 gram PO QID PRN. Procedure Code(s):        --- Professional ---                           [phone], Esophagogastroduodenoscopy, flexible,                            transoral; with transendoscopic balloon dilation of                             esophagus (less than 30 mm diameter) Diagnosis Code(s):        --- Professional ---                           K22.2, Esophageal obstruction                           K21.0, Gastro-esophageal reflux disease with                            esophagitis                           K29.70, Gastritis, unspecified, without bleeding                           K31.89, Other diseases of stomach and duodenum  R13.10, Dysphagia, unspecified                           R12, Heartburn                           R11.10, Vomiting, unspecified CPT copyright 2018 American Medical Association. All rights reserved. The codes documented in this report are preliminary and upon coder review may  be revised to meet current compliance requirements. Gerrit Heck, MD 02/13/2018 9:46:43 AM Number of Addenda: 0

## 2018-02-13 NOTE — Transfer of Care (Signed)
Immediate Anesthesia Transfer of Care Note  Patient: Jason Zimmerman  Procedure(s) Performed: COLONOSCOPY WITH PROPOFOL (N/A ) ESOPHAGOGASTRODUODENOSCOPY (EGD) WITH PROPOFOL (N/A ) ESOPHAGEAL DILATION BIOPSY  Patient Location: PACU  Anesthesia Type:MAC  Level of Consciousness: sedated  Airway & Oxygen Therapy: Patient Spontanous Breathing and Patient connected to nasal cannula oxygen  Post-op Assessment: Report given to RN and Post -op Vital signs reviewed and stable  Post vital signs: Reviewed and stable  Last Vitals:  Vitals Value Taken Time  BP    Temp    Pulse    Resp    SpO2      Last Pain:  Vitals:   02/13/18 0821  TempSrc: Oral  PainSc: 0-No pain         Complications: No apparent anesthesia complications

## 2018-02-13 NOTE — H&P (Signed)
Jason Zimmerman is a 62 y.o. male referred to the [facility] in Sept 2019 for evaluation of dysphagia and presents today for EGD with dilation . No change in dysphagia since last appt. Additionally, plan for colonsocopy for routine CRC screening.   Note from 01/02/18.  Sxs have been ongoing for the last 5-7 months or so. Points to lower sternal border. Has had eating modifications to include stopping in meals to stretch and walk to help food pass vs regurgitate back. Has had to regurgitate recently ingested foods.  No history of food impactions requiring emergent endoscopic intervention.  Solids worse than liquids but has happened with liquids alone. Has now become anxious to eat in fornt of people because of sxs. Has lost a few lbs due to food avoidances. No coughing, aspiration.  No fever, chills, night sweats.  No overt GI blood loss.  Mother with similar sxs requiring EGD with dilations.   He has a hx of reflux and previoulsy treated with pantoprazole, which had controlled reflux symptoms.  This was was stopped approx 1 year ago by his nephrologist due to CKD stage IV and known PPI renal side effects.  Unfortunately he had subsequent worseneing of index reflux sxs.  Index symptoms include heartburn, regurgitation.  Reflux sxs worse at night despite HOB elevation, often having to sleep upright in chair. Takes Zantac prn, but not as effective as PPI had been. Sxs worse when eating late at night, which she tries to avoid as much as possible.  Also due to [facility] screening. Last colonoscopy was 2007 and notable for small rectal hyperplastic polyp.   He otherwise has a complex medical history to include diabetes, complicated by diabetic nephropathy, retinopathy, CAD including STEMI with cardiac arrest in 2012 requiring DES placement and long-term aspirin therapy, history of prostate cancer, anemia of chronic disease, hypertension.  He was recently seen by his nephrologist in May 2019 Jason Sou, Jason Zimmerman at [facility]).  Labs at that time notable for creatinine 3.26 with EGFR 19, normal liver enzymes (aside from ALP 138), normal albumin.      Past Medical History:  Diagnosis Date  . Acute endophthalmitis   . Anemia associated with chronic renal failure   . Chronic kidney disease, stage 4 (severe) ([place])   . Coronary disease   . Diabetic retinopathy ([place])   . Ganglion and cyst of synovium, tendon, and bursa   . Heart attack ([place]) 10/19/2010  . Hyperlipidemia   . Hypertension, renal   . Ingrowing nail   . Iron (Fe) deficiency anemia   . Other staphylococcus infection in conditions classified elsewhere and of unspecified site   . Prostate cancer ([place]) 2011  . Routine general medical examination at a health care facility   . RTA (renal tubular acidosis)   . Secondary hyperparathyroidism, renal ([place])   . Special screening for malignant neoplasm of prostate   . Type II or unspecified type diabetes mellitus without mention of complication, not stated as uncontrolled   . Unspecified osteomyelitis, site unspecified   . Vitamin D deficiency           Past Surgical History:  Procedure Laterality Date  . APPENDECTOMY    . BACK SURGERY    . CARDIAC VALVE SURGERY  2012   Stenbt placed  . NASAL SEPTUM SURGERY  1981  . PROSTATE SURGERY    . VITRECTOMY  12/21/2006        Family History  Problem Relation Age of Onset  .  Cancer Father        Prostate  . Heart disease Father   . Kidney cancer Maternal Grandmother    Social History       Tobacco Use  . Smoking status: Never Smoker  . Smokeless tobacco: Never Used  Substance Use Topics  . Alcohol use: Not Currently  . Drug use: Never    Impression and Plan: Jason Zimmerman is a 62 y.o. male with multiple medical problems including diabetes, CAD with DES to LAD (2012), antiplatelet therapy, history of prostate cancer, presenting with progressive dysphasia,  worsening reflux symptoms and for routine colon cancer screening/polyp surveillance.  1) Dysphagia Symptoms seem most consistent with reflux induced stricture but will certainly evaluate for alternate etiology with EGD with dilation and/or biopsies as appropriate. - EGD with dilation - ASA held x5 days - If no etiology, discussed further eval with EM pending endoscopic findings  2) GERD - EGD to eval for EE, LES laxity, hiatal hernia - Stopped PPI due to low eGFR.   3) CRC screening - Due for routine, average risk CRC screening - Scheduled Colonoscopy with EGD as above  4) DM with nephropathy, retinopathy, CAD: - Given elevated ASA and co-morbidities, he carries elevated peri-operative risks and therefore will schedule to be done at [facility]   - Not presenting with clinical evidence of gastroparesis, but will evaluate for residual food debris suggestive of impaired motility at time of EGD as above.

## 2018-02-13 NOTE — Anesthesia Preprocedure Evaluation (Addendum)
Anesthesia Evaluation  Patient identified by MRN, date of birth, ID band Patient awake    Reviewed: Allergy & Precautions, NPO status , Patient's Chart, lab work & pertinent test results, reviewed documented beta blocker date and time   Airway Mallampati: I  TM Distance: >3 FB Neck ROM: Full    Dental no notable dental hx. (+) Teeth Intact, Dental Advisory Given   Pulmonary neg pulmonary ROS,    Pulmonary exam normal breath sounds clear to auscultation       Cardiovascular hypertension, Pt. on medications and Pt. on home beta blockers + CAD, + Past MI (2012) and + Cardiac Stents  Normal cardiovascular exam Rhythm:Regular Rate:Normal  TTE 2012 EF 40-45%, mild MR   Neuro/Psych negative neurological ROS  negative psych ROS   GI/Hepatic Neg liver ROS, GERD  ,  Endo/Other  diabetes, Type 2, Insulin Dependent  Renal/GU Renal diseaseCKD Stage 4     Musculoskeletal negative musculoskeletal ROS (+)   Abdominal   Peds  Hematology negative hematology ROS (+)   Anesthesia Other Findings Dysphagia, GERD, CRC screening  Reproductive/Obstetrics                          Anesthesia Physical Anesthesia Plan  ASA: III  Anesthesia Plan: MAC   Post-op Pain Management:    Induction: Intravenous  PONV Risk Score and Plan: 1 and Treatment may vary due to age or medical condition  Airway Management Planned: Natural Airway  Additional Equipment:   Intra-op Plan:   Post-operative Plan:   Informed Consent: I have reviewed the patients History and Physical, chart, labs and discussed the procedure including the risks, benefits and alternatives for the proposed anesthesia with the patient or authorized representative who has indicated his/her understanding and acceptance.   Dental advisory given  Plan Discussed with: CRNA  Anesthesia Plan Comments:         Anesthesia Quick Evaluation

## 2018-02-13 NOTE — Anesthesia Postprocedure Evaluation (Signed)
Anesthesia Post Note  Patient: Rhyan Radler  Procedure(s) Performed: COLONOSCOPY WITH PROPOFOL (N/A ) ESOPHAGOGASTRODUODENOSCOPY (EGD) WITH PROPOFOL (N/A ) ESOPHAGEAL DILATION BIOPSY     Patient location during evaluation: Endoscopy Anesthesia Type: MAC Level of consciousness: awake and alert Pain management: pain level controlled Vital Signs Assessment: post-procedure vital signs reviewed and stable Respiratory status: spontaneous breathing, nonlabored ventilation, respiratory function stable and patient connected to nasal cannula oxygen Cardiovascular status: blood pressure returned to baseline and stable Postop Assessment: no apparent nausea or vomiting Anesthetic complications: no    Last Vitals:  Vitals:   02/13/18 1000 02/13/18 1010  BP: (!) 97/58 120/60  Pulse: (!) 51 (!) 25  Resp: 13 18  Temp:    SpO2: 99% 90%    Last Pain:  Vitals:   02/13/18 1010  TempSrc:   PainSc: 0-No pain                 Pegi Milazzo L Tsuruko Murtha

## 2018-02-13 NOTE — Op Note (Signed)
[facility] Patient Name: Jason Zimmerman Procedure Date: 02/13/2018 MRN: [id number] Attending MD: Gerrit Heck , MD Date of Birth: 29-Nov-1955 CSN: [id number] Age: 62 Admit Type: Outpatient Procedure:                Colonoscopy Indications:              Screening for colorectal malignant neoplasm (last                            colonoscopy was more than 10 years ago) Providers:                Gerrit Heck, MD, Vista Lawman, RN, Cletis Athens,                            Technician Referring MD:              Medicines:                Monitored Anesthesia Care Complications:            No immediate complications. Estimated Blood Loss:     Estimated blood loss was minimal. Procedure:                Pre-Anesthesia Assessment:                           - Prior to the procedure, a History and Physical                            was performed, and patient medications and                            allergies were reviewed. The patient's tolerance of                            previous anesthesia was also reviewed. The risks                            and benefits of the procedure and the sedation                            options and risks were discussed with the patient.                            All questions were answered, and informed consent                            was obtained. Prior Anticoagulants: The patient has                            taken aspirin, last dose was 5 days prior to                            procedure. ASA Grade Assessment: III - A patient  with severe systemic disease. After reviewing the                            risks and benefits, the patient was deemed in                            satisfactory condition to undergo the procedure.                           After obtaining informed consent, the colonoscope                            was passed under direct vision. Throughout the   procedure, the patient's blood pressure, pulse, and                            oxygen saturations were monitored continuously. The                            CF-HQ190L ([id number] Olympus adult colonoscope was                            introduced through the anus and advanced to the the                            terminal ileum. The colonoscopy was performed                            without difficulty. The patient tolerated the                            procedure well. The quality of the bowel                            preparation was adequate. Scope In: 9:22:13 AM Scope Out: 9:38:30 AM Scope Withdrawal Time: 0 hours 12 minutes 4 seconds  Total Procedure Duration: 0 hours 16 minutes 17 seconds  Findings:      The perianal and digital rectal examinations were normal.      A 2 mm polyp was found in the rectum (benign-appearing lesion). The       polyp was sessile. The polyp was removed with a cold biopsy forceps.       Resection and retrieval were complete. Estimated blood loss was minimal.      The exam was otherwise normal throughout the examined colon.      The terminal ileum appeared normal. Impression:               - One benign appearing 2 mm polyp in the rectum,                            removed with a cold biopsy forceps. Resected and                            retrieved.                           -  The examined portion of the ileum was normal. Moderate Sedation:      N/A- Per Anesthesia Care Recommendation:           - Patient has a contact number available for                            emergencies. The signs and symptoms of potential                            delayed complications were discussed with the                            patient. Return to normal activities tomorrow.                            Written discharge instructions were provided to the                            patient.                           - Continue present medications.                            - Await pathology results.                           - Repeat colonoscopy in 5-10 years for surveillance                            based on pathology results.                           - Return to GI clinic in 2 months. Procedure Code(s):        --- Professional ---                           [phone], Colonoscopy, flexible; with biopsy, single                            or multiple Diagnosis Code(s):        --- Professional ---                           Z12.11, Encounter for screening for malignant                            neoplasm of colon                           K62.1, Rectal polyp CPT copyright 2018 [employer]. All rights reserved. The codes documented in this report are preliminary and upon coder review may  be revised to meet current compliance requirements. Gerrit Heck, MD 02/13/2018 9:49:16 AM Number of Addenda: 0

## 2018-02-14 ENCOUNTER — Encounter (HOSPITAL_COMMUNITY): Payer: Self-pay | Admitting: Gastroenterology

## 2018-02-16 ENCOUNTER — Telehealth: Payer: Self-pay

## 2018-02-16 ENCOUNTER — Encounter: Payer: Self-pay | Admitting: Gastroenterology

## 2018-02-16 NOTE — Telephone Encounter (Signed)
Dr. Jimmy Footman sent orders stating patient can resume taking Pepcid twice daily. And to follow-up in his clinic in 3 months. I notified the patient and he verbalized understanding of orders. While talking to the patient he stated that his Carafate was $400 for a month supply so I faxed over a co-pay card to his pharmacy.

## 2018-02-22 ENCOUNTER — Telehealth: Payer: Self-pay | Admitting: Gastroenterology

## 2018-02-22 NOTE — Telephone Encounter (Signed)
Pt called stating that his pharmacy did not receive copay card that was faxed over. He is requesting to follow up with his pharmacy.

## 2018-02-23 DIAGNOSIS — Z794 Long term (current) use of insulin: Secondary | ICD-10-CM | POA: Diagnosis not present

## 2018-02-23 DIAGNOSIS — N183 Chronic kidney disease, stage 3 (moderate): Secondary | ICD-10-CM | POA: Diagnosis not present

## 2018-02-23 DIAGNOSIS — E1122 Type 2 diabetes mellitus with diabetic chronic kidney disease: Secondary | ICD-10-CM | POA: Diagnosis not present

## 2018-02-23 DIAGNOSIS — E782 Mixed hyperlipidemia: Secondary | ICD-10-CM | POA: Diagnosis not present

## 2018-02-24 DIAGNOSIS — N183 Chronic kidney disease, stage 3 (moderate): Secondary | ICD-10-CM | POA: Diagnosis not present

## 2018-02-24 DIAGNOSIS — Z794 Long term (current) use of insulin: Secondary | ICD-10-CM | POA: Diagnosis not present

## 2018-02-24 DIAGNOSIS — E782 Mixed hyperlipidemia: Secondary | ICD-10-CM | POA: Diagnosis not present

## 2018-02-24 DIAGNOSIS — E1122 Type 2 diabetes mellitus with diabetic chronic kidney disease: Secondary | ICD-10-CM | POA: Diagnosis not present

## 2018-02-27 DIAGNOSIS — Z23 Encounter for immunization: Secondary | ICD-10-CM | POA: Diagnosis not present

## 2018-03-01 NOTE — Telephone Encounter (Signed)
Pt states pharmacy has not received the fax. Please fax the copay card again to  [facility], [place]

## 2018-03-02 NOTE — Telephone Encounter (Signed)
Another Co-Pay card has been faxed to Pharmacy with receipt of fax.

## 2018-03-06 DIAGNOSIS — E113311 Type 2 diabetes mellitus with moderate nonproliferative diabetic retinopathy with macular edema, right eye: Secondary | ICD-10-CM | POA: Diagnosis not present

## 2018-03-06 DIAGNOSIS — H3342 Traction detachment of retina, left eye: Secondary | ICD-10-CM | POA: Diagnosis not present

## 2018-03-06 DIAGNOSIS — H2511 Age-related nuclear cataract, right eye: Secondary | ICD-10-CM | POA: Diagnosis not present

## 2018-03-06 DIAGNOSIS — H2702 Aphakia, left eye: Secondary | ICD-10-CM | POA: Diagnosis not present

## 2018-03-13 ENCOUNTER — Telehealth: Payer: Self-pay | Admitting: Gastroenterology

## 2018-03-13 DIAGNOSIS — E113311 Type 2 diabetes mellitus with moderate nonproliferative diabetic retinopathy with macular edema, right eye: Secondary | ICD-10-CM | POA: Diagnosis not present

## 2018-03-13 DIAGNOSIS — H3342 Traction detachment of retina, left eye: Secondary | ICD-10-CM | POA: Diagnosis not present

## 2018-03-13 DIAGNOSIS — H33059 Total retinal detachment, unspecified eye: Secondary | ICD-10-CM | POA: Diagnosis not present

## 2018-03-18 ENCOUNTER — Emergency Department (HOSPITAL_COMMUNITY): Payer: BLUE CROSS/BLUE SHIELD

## 2018-03-18 ENCOUNTER — Other Ambulatory Visit: Payer: Self-pay

## 2018-03-18 ENCOUNTER — Encounter (HOSPITAL_COMMUNITY): Payer: Self-pay

## 2018-03-18 ENCOUNTER — Emergency Department (HOSPITAL_COMMUNITY)
Admission: EM | Admit: 2018-03-18 | Discharge: 2018-03-19 | Disposition: A | Discharge status: Home / Self Care | Payer: BLUE CROSS/BLUE SHIELD | Attending: Emergency Medicine | Admitting: Emergency Medicine

## 2018-03-18 DIAGNOSIS — I129 Hypertensive chronic kidney disease with stage 1 through stage 4 chronic kidney disease, or unspecified chronic kidney disease: Secondary | ICD-10-CM | POA: Insufficient documentation

## 2018-03-18 DIAGNOSIS — W19XXXA Unspecified fall, initial encounter: Secondary | ICD-10-CM

## 2018-03-18 DIAGNOSIS — S6992XA Unspecified injury of left wrist, hand and finger(s), initial encounter: Secondary | ICD-10-CM | POA: Diagnosis not present

## 2018-03-18 DIAGNOSIS — Z7982 Long term (current) use of aspirin: Secondary | ICD-10-CM | POA: Insufficient documentation

## 2018-03-18 DIAGNOSIS — W010XXA Fall on same level from slipping, tripping and stumbling without subsequent striking against object, initial encounter: Secondary | ICD-10-CM | POA: Insufficient documentation

## 2018-03-18 DIAGNOSIS — E785 Hyperlipidemia, unspecified: Secondary | ICD-10-CM | POA: Insufficient documentation

## 2018-03-18 DIAGNOSIS — Z794 Long term (current) use of insulin: Secondary | ICD-10-CM | POA: Insufficient documentation

## 2018-03-18 DIAGNOSIS — Z79899 Other long term (current) drug therapy: Secondary | ICD-10-CM | POA: Insufficient documentation

## 2018-03-18 DIAGNOSIS — N184 Chronic kidney disease, stage 4 (severe): Secondary | ICD-10-CM | POA: Insufficient documentation

## 2018-03-18 DIAGNOSIS — Y998 Other external cause status: Secondary | ICD-10-CM | POA: Insufficient documentation

## 2018-03-18 DIAGNOSIS — Y929 Unspecified place or not applicable: Secondary | ICD-10-CM | POA: Diagnosis not present

## 2018-03-18 DIAGNOSIS — S52552A Other extraarticular fracture of lower end of left radius, initial encounter for closed fracture: Secondary | ICD-10-CM | POA: Diagnosis not present

## 2018-03-18 DIAGNOSIS — E1122 Type 2 diabetes mellitus with diabetic chronic kidney disease: Secondary | ICD-10-CM | POA: Insufficient documentation

## 2018-03-18 DIAGNOSIS — M25532 Pain in left wrist: Secondary | ICD-10-CM | POA: Diagnosis not present

## 2018-03-18 DIAGNOSIS — Y9301 Activity, walking, marching and hiking: Secondary | ICD-10-CM | POA: Diagnosis not present

## 2018-03-18 DIAGNOSIS — Z8546 Personal history of malignant neoplasm of prostate: Secondary | ICD-10-CM | POA: Insufficient documentation

## 2018-03-18 MED ORDER — ACETAMINOPHEN 500 MG PO TABS
1000.0000 mg | ORAL_TABLET | Freq: Once | ORAL | Status: AC
  Administered 2018-03-18: 1000 mg via ORAL
  Filled 2018-03-18: qty 2

## 2018-03-18 MED ORDER — OXYCODONE-ACETAMINOPHEN 5-325 MG PO TABS
1.0000 | ORAL_TABLET | Freq: Once | ORAL | Status: AC
  Administered 2018-03-18: 1 via ORAL
  Filled 2018-03-18: qty 1

## 2018-03-18 MED ORDER — ONDANSETRON 4 MG PO TBDP
4.0000 mg | ORAL_TABLET | Freq: Once | ORAL | Status: AC
  Administered 2018-03-18: 4 mg via ORAL
  Filled 2018-03-18: qty 1

## 2018-03-18 NOTE — ED Provider Notes (Addendum)
[facility] EMERGENCY DEPARTMENT Provider Note   CSN: [id number] Arrival date & time: 03/18/18  2211     History   Chief Complaint Chief Complaint  Patient presents with  . Wrist Injury    HPI Jason Zimmerman is a 62 y.o. male with history of left closed fracture of second metacarpal status post ORIF (Dr. Caralyn Guile) is here for evaluation of left hand and left wrist pain.  Pain onset immediately after a mechanical fall.  Patient states he was walking when he tripped and braced himself and fell onto his left hand on an outstretched hand.  He had mild, sudden pain initially but this has worsened.  He has taken Tylenol without relief.  Associated with bruising and swelling to the base of his thumb.  Pain radiates from the thumb to the radial aspect of the wrist.  Pain is worse with palpation and movement.  No alleviating factors.  He denies any paresthesias or loss of sensation distally.  Denies any other injury, head trauma, LOC after the injury.  He is right-hand dominant.  HPI  Past Medical History:  Diagnosis Date  . Acute endophthalmitis   . Anemia associated with chronic renal failure   . Chronic kidney disease, stage 4 (severe) ([place])   . Coronary disease   . Diabetic retinopathy ([place])   . Ganglion and cyst of synovium, tendon, and bursa   . Heart attack ([place]) 10/19/2010  . Hyperlipidemia   . Hypertension, renal   . Ingrowing nail   . Iron (Fe) deficiency anemia   . Other staphylococcus infection in conditions classified elsewhere and of unspecified site   . Prostate cancer ([place]) 2011  . Routine general medical examination at a health care facility   . RTA (renal tubular acidosis)   . Secondary hyperparathyroidism, renal ([place])   . Special screening for malignant neoplasm of prostate   . Type II or unspecified type diabetes mellitus without mention of complication, not stated as uncontrolled   . Unspecified osteomyelitis, site unspecified   . Vitamin D  deficiency     Patient Active Problem List   Diagnosis Date Noted  . Dysphagia   . Gastroesophageal reflux disease   . Benign esophageal stricture   . Gastritis and gastroduodenitis   . Rectal polyp   . Renal insufficiency 12/27/2011  . Anemia 12/27/2011  . Skin nodule 12/26/2011  . Encounter for colorectal cancer screening 12/26/2011  . CAD (coronary artery disease) 03/15/2011  . ADENOCARCINOMA, PROSTATE 02/25/2010  . INGROWN TOENAIL 07/31/2007  . METHICILLIN RESISTANT STAPHYLOCOCCUS AUREUS INFECTION 01/24/2007  . ENDOPHTHALMITIS, ACUTE 01/24/2007  . OSTEOMYELITIS 01/24/2007  . DIABETES MELLITUS, TYPE II 01/02/2007  . HYPERLIPIDEMIA 01/02/2007    Past Surgical History:  Procedure Laterality Date  . APPENDECTOMY    . BACK SURGERY    . BIOPSY  02/13/2018   Procedure: BIOPSY;  Surgeon: Lavena Bullion, DO;  Location: [facility];  Service: Gastroenterology;;  . CARDIAC VALVE SURGERY  2012   Stent placed  . COLONOSCOPY WITH PROPOFOL N/A 02/13/2018   Procedure: COLONOSCOPY WITH PROPOFOL;  Surgeon: Lavena Bullion, DO;  Location: [facility];  Service: Gastroenterology;  Laterality: N/A;  . ESOPHAGEAL DILATION  02/13/2018   Procedure: ESOPHAGEAL DILATION;  Surgeon: Lavena Bullion, DO;  Location: [facility];  Service: Gastroenterology;;  . ESOPHAGOGASTRODUODENOSCOPY (EGD) WITH PROPOFOL N/A 02/13/2018   Procedure: ESOPHAGOGASTRODUODENOSCOPY (EGD) WITH PROPOFOL;  Surgeon: Lavena Bullion, DO;  [facility];  Service: Gastroenterology;  Laterality: N/A;  .  NASAL SEPTUM SURGERY  1981  . PROSTATE SURGERY    . VITRECTOMY  12/21/2006        Home Medications    Prior to Admission medications   Medication Sig Start Date End Date Taking? Authorizing Provider  acetaminophen (TYLENOL) 650 MG CR tablet Take 650 mg by mouth every 6 (six) hours as needed for pain.     [provider]  aspirin EC 81 MG tablet Take 81 mg by mouth at bedtime.      [provider]  atorvastatin (LIPITOR) 80 MG tablet Take 80 mg by mouth daily.    [provider]  carvedilol (COREG) 3.125 MG tablet Take 3.125 mg by mouth every 12 (twelve) hours.      [provider]  glucose blood (FREESTYLE TEST STRIPS) test strip 1 each by Other route 2 (two) times daily as needed for other. PRN 11/11/10   Renato Shin, MD  Insulin Glargine (LANTUS SOLOSTAR) 100 UNIT/ML Solostar Pen Inject 30 Units into the skin at bedtime.  12/24/15   [provider]  nitroGLYCERIN (NITROSTAT) 0.4 MG SL tablet Place 0.4 mg under the tongue every 5 (five) minutes as needed for chest pain.     [provider]  pioglitazone (ACTOS) 30 MG tablet Take 1 tablet by mouth daily. *APPOINTMENT NEEDED FOR FURTHER REFILLS* 04/22/14   Renato Shin, MD  ranitidine (ZANTAC) 150 MG tablet Take 150 mg by mouth as needed for heartburn.    [provider]  sodium bicarbonate 650 MG tablet Take 1,300 mg by mouth 2 (two) times daily.    [provider]  sucralfate (CARAFATE) 1 GM/10ML suspension Take 10 mLs (1 g total) by mouth 4 (four) times daily. 02/13/18 02/13/19  Cirigliano, Vito V, DO  timolol (TIMOPTIC) 0.5 % ophthalmic solution Place 1 drop into the left eye daily. 01/11/18   [provider]    Family History Family History  Problem Relation Age of Onset  . Cancer Father        Prostate  . Heart disease Father   . Kidney cancer Maternal Grandmother     Social History Social History   Tobacco Use  . Smoking status: Never Smoker  . Smokeless tobacco: Never Used  Substance Use Topics  . Alcohol use: Not Currently  . Drug use: Never     Allergies   Patient has no known allergies.   Review of Systems Review of Systems  Musculoskeletal: Positive for arthralgias and joint swelling.  All other systems reviewed and are negative.    Physical Exam Updated Vital Signs BP (!) 144/88   Pulse 69   Temp 97.9 F (36.6  C) (Oral)   Resp 19   Ht 5\' 10"  (1.778 m)   Wt 83.9 kg   SpO2 100%   BMI 26.54 kg/m   Physical Exam  Constitutional: He is oriented to person, place, and time. He appears well-developed and well-nourished.  Non-toxic appearance.  HENT:  Head: Normocephalic.  Right Ear: External ear normal.  Left Ear: External ear normal.  Nose: Nose normal.  Eyes: Conjunctivae and EOM are normal.  Neck: Full passive range of motion without pain.  Cardiovascular: Normal rate.  Pulses:      Radial pulses are 1+ on the right side, and 1+ on the left side.  Brisk cap refill to left fingertips  Pulmonary/Chest: Effort normal. No tachypnea. No respiratory distress.  Musculoskeletal: Normal range of motion.       Right wrist: He  exhibits bony tenderness and swelling.  Left hand/wrist: Patient is guarding his wrist.  There is obvious focal moderate edema dorsally to the base of the thumb extending over the radial aspect of the wrist with overlying ecchymosis.  There is focal tenderness along the first metacarpal, scaphoid and distal radius.  Decreased wrist flexion and extension secondary to pain.  Decreased thumb R OM secondary to pain.  Slightly decreased strength at the thumb secondary to pain.  Patient unable to oppose thumb to other fingers due to pain.  Pain with supination.  No focal bony tenderness to other metacarpals, digits, distal ulna.    Neurological: He is alert and oriented to person, place, and time.  Sensation to light touch intact in median, ulnar, radial nerve distribution of the left hand.  Skin: Skin is warm and dry. Capillary refill takes less than 2 seconds.  Psychiatric: His behavior is normal. Thought content normal.     ED Treatments / Results  Labs (all labs ordered are listed, but only abnormal results are displayed) Labs Reviewed - No data to display  EKG None  Radiology Dg Wrist Complete Left  Result Date: 03/18/2018 CLINICAL DATA:  Fall on outstretched hand. Pain,  swelling to posterior left wrist EXAM: LEFT WRIST - COMPLETE 3+ VIEW COMPARISON:  01/29/2017 FINDINGS: Internal fixation across a old healed left 2nd metacarpal fracture. No acute fracture, subluxation or dislocation. Soft tissues are intact. IMPRESSION: No acute bony abnormality. Electronically Signed   By: Rolm Baptise M.D.   On: 03/18/2018 23:29   Ct Wrist Left Wo Contrast  Result Date: 03/19/2018 CLINICAL DATA:  Trip and fall injury earlier today. Worsening pain. No fracture on plain films. EXAM: CT OF THE LEFT WRIST WITHOUT CONTRAST TECHNIQUE: Multidetector CT imaging was performed according to the standard protocol. Multiplanar CT image reconstructions were also generated. COMPARISON:  Left wrist radiographs 03/18/2018 FINDINGS: Bones/Joint/Cartilage Postoperative changes with plate and screw fixation with old fracture deformity of the second metacarpal bone. Cortical irregularity and vague linear lucency along the dorsal aspect of the distal radius consistent with a small nondisplaced chip fracture. This does extend to the radiocarpal articular surface. No displaced fractures identified. No destructive or expansile bone lesions. Ligaments Suboptimally assessed by CT. Muscles and Tendons Limited visualization. No discrete abnormality identified. Soft tissues Mild soft tissue edema. No abscess or hematoma. Vascular calcifications. IMPRESSION: Postoperative changes with plate and screw fixation of the second metacarpal bone. Nondisplaced chip fracture along the dorsal aspect of the distal radius. Electronically Signed   By: Lucienne Capers M.D.   On: 03/19/2018 01:37   Dg Hand Complete Left  Result Date: 03/18/2018 CLINICAL DATA:  Fall on outstretched hand.  Posterior wrist pain EXAM: LEFT HAND - COMPLETE 3+ VIEW COMPARISON:  01/29/2017 FINDINGS: Internal fixation with plate and screw fixation across a old healed left 2nd metacarpal fracture. No acute fracture, subluxation or dislocation. Early  osteoarthritic changes in the IP joint. IMPRESSION: No acute bony abnormality. Electronically Signed   By: Rolm Baptise M.D.   On: 03/18/2018 23:30    Procedures Procedures (including critical care time)  Medications Ordered in ED Medications  oxyCODONE-acetaminophen (PERCOCET/ROXICET) 5-325 MG per tablet 1 tablet (1 tablet Oral Given 03/18/18 2237)  acetaminophen (TYLENOL) tablet 1,000 mg (1,000 mg Oral Given 03/18/18 2237)  ondansetron (ZOFRAN-ODT) disintegrating tablet 4 mg (4 mg Oral Given 03/18/18 2236)     Initial Impression / Assessment and Plan / ED Course  I have reviewed the triage vital signs and  the nursing notes.  Pertinent labs & imaging results that were available during my care of the patient were reviewed by me and considered in my medical decision making (see chart for details).  Clinical Course as of Mar 19 253  Mon Mar 19, 2018  0142  IMPRESSION: Postoperative changes with plate and screw fixation of the second metacarpal bone. Nondisplaced chip fracture along the dorsal aspect of the distal radius.  CT Wrist Left Wo Contrast [CG]  0202 IMPRESSION: Postoperative changes with plate and screw fixation of the second metacarpal bone. Nondisplaced chip fracture along the dorsal aspect of the distal radius.  CT Wrist Left Wo Contrast [CG]    Clinical Course User Index [CG] Kinnie Feil, PA-C    Concern for fx specifically scaphoid fx given history of [place] and exam. X-rays negative however given patient history, classic fall and exam findings high concern for scaphoid fx. Discussed CT of wrist. Occult scaphoid fx can be missed on x-ray as high as 50% of the time.  0210: CT shows nondisplaced chip fx of distal radius dorsally. Will place in sugar tong and provide thumb immobilization for comfort. DC with RICE, high dose NSAIDs and f/u with hand in 7 days for re-evaluation. Return precautions given. Pt and wife in agreement and comfortable with plan.  Final  Clinical Impressions(s) / ED Diagnoses   Final diagnoses:  Other closed extra-articular fracture of distal end of left radius, initial encounter  Fall, initial encounter    ED Discharge Orders    None       Kinnie Feil, PA-C 03/19/18 0254    Davonna Belling, MD 03/21/18 [phone]

## 2018-03-18 NOTE — ED Notes (Signed)
Pt returned from XRAY 

## 2018-03-18 NOTE — ED Triage Notes (Signed)
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 Pain 7/10 took 2 tylenols earlier today but pain has gotten worse, hx of metal plate in left hand

## 2018-03-18 NOTE — ED Notes (Signed)
Patient transported to X-ray 

## 2018-03-19 ENCOUNTER — Emergency Department (HOSPITAL_COMMUNITY): Payer: BLUE CROSS/BLUE SHIELD

## 2018-03-19 DIAGNOSIS — S6992XA Unspecified injury of left wrist, hand and finger(s), initial encounter: Secondary | ICD-10-CM | POA: Diagnosis not present

## 2018-03-19 DIAGNOSIS — M25532 Pain in left wrist: Secondary | ICD-10-CM | POA: Diagnosis not present

## 2018-03-19 NOTE — ED Notes (Signed)
Ortho at bedside for splint.

## 2018-03-19 NOTE — ED Notes (Signed)
Pt returned from CT °

## 2018-03-19 NOTE — ED Notes (Signed)
Patient transported to CT 

## 2018-03-19 NOTE — Discharge Instructions (Addendum)
You were seen in the ER for left wrist pain after fall.  X-rays of hand and wrist were normal. Hardware is intact. CT of wrist showed a nondisplaced avulsion fracture of the distal radius.  Guidelines recommend immobilization with a splint. A sugar tong splint was used today to provide maximum amount of support.    Wear splint, keep it dry. Keep extremity elevated. Alternate 600 mg ibuprofen and 1000 mg acetaminophen every 6 hours for the next 3-5 days.  Ice.   Follow up with Dr Ambrose Finland in 7 days for re-evaluation.  Return to ER if you have purple discoloration or coolness to finger tips, tingling, loss of sensation to fingertips or extremity

## 2018-03-19 NOTE — ED Notes (Signed)
ED Provider at bedside. 

## 2018-03-19 NOTE — ED Notes (Signed)
Sugar Tong splint applied to left arm by [employer].

## 2018-03-19 NOTE — ED Notes (Signed)
Patient Alert and oriented to baseline. Stable and ambulatory to baseline. Patient verbalized understanding of the discharge instructions.  Patient belongings were taken by the patient.   

## 2018-03-23 DIAGNOSIS — I129 Hypertensive chronic kidney disease with stage 1 through stage 4 chronic kidney disease, or unspecified chronic kidney disease: Secondary | ICD-10-CM | POA: Diagnosis not present

## 2018-03-23 DIAGNOSIS — S52552A Other extraarticular fracture of lower end of left radius, initial encounter for closed fracture: Secondary | ICD-10-CM | POA: Diagnosis not present

## 2018-03-23 DIAGNOSIS — S62341D Nondisplaced fracture of base of second metacarpal bone. left hand, subsequent encounter for fracture with routine healing: Secondary | ICD-10-CM | POA: Diagnosis not present

## 2018-03-23 DIAGNOSIS — N189 Chronic kidney disease, unspecified: Secondary | ICD-10-CM | POA: Diagnosis not present

## 2018-03-23 DIAGNOSIS — N184 Chronic kidney disease, stage 4 (severe): Secondary | ICD-10-CM | POA: Diagnosis not present

## 2018-04-01 DIAGNOSIS — R4020 Unspecified coma: Secondary | ICD-10-CM | POA: Diagnosis not present

## 2018-04-01 DIAGNOSIS — J209 Acute bronchitis, unspecified: Secondary | ICD-10-CM | POA: Diagnosis not present

## 2018-04-01 DIAGNOSIS — E860 Dehydration: Secondary | ICD-10-CM | POA: Diagnosis not present

## 2018-04-01 DIAGNOSIS — R42 Dizziness and giddiness: Secondary | ICD-10-CM | POA: Diagnosis not present

## 2018-04-01 DIAGNOSIS — R0902 Hypoxemia: Secondary | ICD-10-CM | POA: Diagnosis not present

## 2018-04-01 DIAGNOSIS — R55 Syncope and collapse: Secondary | ICD-10-CM | POA: Diagnosis not present

## 2018-04-01 DIAGNOSIS — R05 Cough: Secondary | ICD-10-CM | POA: Diagnosis not present

## 2018-04-02 DIAGNOSIS — R55 Syncope and collapse: Secondary | ICD-10-CM | POA: Diagnosis not present

## 2018-04-06 DIAGNOSIS — J209 Acute bronchitis, unspecified: Secondary | ICD-10-CM | POA: Diagnosis not present

## 2018-04-06 DIAGNOSIS — I10 Essential (primary) hypertension: Secondary | ICD-10-CM | POA: Diagnosis not present

## 2018-04-06 DIAGNOSIS — E1165 Type 2 diabetes mellitus with hyperglycemia: Secondary | ICD-10-CM | POA: Diagnosis not present

## 2018-04-06 DIAGNOSIS — E11319 Type 2 diabetes mellitus with unspecified diabetic retinopathy without macular edema: Secondary | ICD-10-CM | POA: Diagnosis not present

## 2018-04-13 DIAGNOSIS — S62341D Nondisplaced fracture of base of second metacarpal bone. left hand, subsequent encounter for fracture with routine healing: Secondary | ICD-10-CM | POA: Diagnosis not present

## 2018-04-13 DIAGNOSIS — S52552A Other extraarticular fracture of lower end of left radius, initial encounter for closed fracture: Secondary | ICD-10-CM | POA: Diagnosis not present

## 2018-05-14 NOTE — Telephone Encounter (Signed)
Done

## 2018-05-30 DIAGNOSIS — I25118 Atherosclerotic heart disease of native coronary artery with other forms of angina pectoris: Secondary | ICD-10-CM | POA: Diagnosis not present

## 2018-05-30 DIAGNOSIS — E1122 Type 2 diabetes mellitus with diabetic chronic kidney disease: Secondary | ICD-10-CM | POA: Diagnosis not present

## 2018-05-30 DIAGNOSIS — N183 Chronic kidney disease, stage 3 (moderate): Secondary | ICD-10-CM | POA: Diagnosis not present

## 2018-05-30 DIAGNOSIS — I252 Old myocardial infarction: Secondary | ICD-10-CM | POA: Diagnosis not present

## 2018-06-18 DIAGNOSIS — D631 Anemia in chronic kidney disease: Secondary | ICD-10-CM | POA: Diagnosis not present

## 2018-06-18 DIAGNOSIS — I129 Hypertensive chronic kidney disease with stage 1 through stage 4 chronic kidney disease, or unspecified chronic kidney disease: Secondary | ICD-10-CM | POA: Diagnosis not present

## 2018-06-18 DIAGNOSIS — N2581 Secondary hyperparathyroidism of renal origin: Secondary | ICD-10-CM | POA: Diagnosis not present

## 2018-06-18 DIAGNOSIS — N189 Chronic kidney disease, unspecified: Secondary | ICD-10-CM | POA: Diagnosis not present

## 2018-06-18 DIAGNOSIS — N184 Chronic kidney disease, stage 4 (severe): Secondary | ICD-10-CM | POA: Diagnosis not present

## 2018-09-07 DIAGNOSIS — N184 Chronic kidney disease, stage 4 (severe): Secondary | ICD-10-CM | POA: Diagnosis not present

## 2018-09-07 DIAGNOSIS — I129 Hypertensive chronic kidney disease with stage 1 through stage 4 chronic kidney disease, or unspecified chronic kidney disease: Secondary | ICD-10-CM | POA: Diagnosis not present

## 2018-09-07 DIAGNOSIS — N189 Chronic kidney disease, unspecified: Secondary | ICD-10-CM | POA: Diagnosis not present

## 2018-09-07 DIAGNOSIS — N2581 Secondary hyperparathyroidism of renal origin: Secondary | ICD-10-CM | POA: Diagnosis not present

## 2018-09-07 DIAGNOSIS — D631 Anemia in chronic kidney disease: Secondary | ICD-10-CM | POA: Diagnosis not present

## 2018-11-07 DIAGNOSIS — Z794 Long term (current) use of insulin: Secondary | ICD-10-CM | POA: Diagnosis not present

## 2018-11-07 DIAGNOSIS — E782 Mixed hyperlipidemia: Secondary | ICD-10-CM | POA: Diagnosis not present

## 2018-11-07 DIAGNOSIS — E1122 Type 2 diabetes mellitus with diabetic chronic kidney disease: Secondary | ICD-10-CM | POA: Diagnosis not present

## 2018-11-07 DIAGNOSIS — N183 Chronic kidney disease, stage 3 (moderate): Secondary | ICD-10-CM | POA: Diagnosis not present

## 2018-12-03 DIAGNOSIS — N2589 Other disorders resulting from impaired renal tubular function: Secondary | ICD-10-CM | POA: Diagnosis not present

## 2018-12-03 DIAGNOSIS — I129 Hypertensive chronic kidney disease with stage 1 through stage 4 chronic kidney disease, or unspecified chronic kidney disease: Secondary | ICD-10-CM | POA: Diagnosis not present

## 2018-12-03 DIAGNOSIS — N184 Chronic kidney disease, stage 4 (severe): Secondary | ICD-10-CM | POA: Diagnosis not present

## 2018-12-03 DIAGNOSIS — N2581 Secondary hyperparathyroidism of renal origin: Secondary | ICD-10-CM | POA: Diagnosis not present

## 2019-03-06 DIAGNOSIS — N184 Chronic kidney disease, stage 4 (severe): Secondary | ICD-10-CM | POA: Diagnosis not present

## 2019-03-06 DIAGNOSIS — E1122 Type 2 diabetes mellitus with diabetic chronic kidney disease: Secondary | ICD-10-CM | POA: Diagnosis not present

## 2019-03-06 DIAGNOSIS — N2581 Secondary hyperparathyroidism of renal origin: Secondary | ICD-10-CM | POA: Diagnosis not present

## 2019-03-06 DIAGNOSIS — N2589 Other disorders resulting from impaired renal tubular function: Secondary | ICD-10-CM | POA: Diagnosis not present

## 2019-03-06 DIAGNOSIS — I129 Hypertensive chronic kidney disease with stage 1 through stage 4 chronic kidney disease, or unspecified chronic kidney disease: Secondary | ICD-10-CM | POA: Diagnosis not present

## 2019-04-10 DIAGNOSIS — N184 Chronic kidney disease, stage 4 (severe): Secondary | ICD-10-CM | POA: Diagnosis not present

## 2019-06-03 DIAGNOSIS — I252 Old myocardial infarction: Secondary | ICD-10-CM | POA: Diagnosis not present

## 2019-06-03 DIAGNOSIS — I25118 Atherosclerotic heart disease of native coronary artery with other forms of angina pectoris: Secondary | ICD-10-CM | POA: Diagnosis not present

## 2019-06-03 DIAGNOSIS — E782 Mixed hyperlipidemia: Secondary | ICD-10-CM | POA: Diagnosis not present

## 2019-06-03 DIAGNOSIS — Z794 Long term (current) use of insulin: Secondary | ICD-10-CM | POA: Diagnosis not present

## 2019-06-03 DIAGNOSIS — N183 Chronic kidney disease, stage 3 unspecified: Secondary | ICD-10-CM | POA: Diagnosis not present

## 2019-06-03 DIAGNOSIS — E1122 Type 2 diabetes mellitus with diabetic chronic kidney disease: Secondary | ICD-10-CM | POA: Diagnosis not present

## 2019-06-17 DIAGNOSIS — E782 Mixed hyperlipidemia: Secondary | ICD-10-CM | POA: Diagnosis not present

## 2019-07-12 DIAGNOSIS — I129 Hypertensive chronic kidney disease with stage 1 through stage 4 chronic kidney disease, or unspecified chronic kidney disease: Secondary | ICD-10-CM | POA: Diagnosis not present

## 2019-07-12 DIAGNOSIS — N2589 Other disorders resulting from impaired renal tubular function: Secondary | ICD-10-CM | POA: Diagnosis not present

## 2019-07-12 DIAGNOSIS — E1122 Type 2 diabetes mellitus with diabetic chronic kidney disease: Secondary | ICD-10-CM | POA: Diagnosis not present

## 2019-07-12 DIAGNOSIS — N184 Chronic kidney disease, stage 4 (severe): Secondary | ICD-10-CM | POA: Diagnosis not present

## 2019-07-12 DIAGNOSIS — N2581 Secondary hyperparathyroidism of renal origin: Secondary | ICD-10-CM | POA: Diagnosis not present

## 2019-08-30 ENCOUNTER — Other Ambulatory Visit (INDEPENDENT_AMBULATORY_CARE_PROVIDER_SITE_OTHER): Payer: Self-pay | Admitting: Ophthalmology

## 2019-10-29 DIAGNOSIS — L578 Other skin changes due to chronic exposure to nonionizing radiation: Secondary | ICD-10-CM | POA: Diagnosis not present

## 2019-10-29 DIAGNOSIS — L820 Inflamed seborrheic keratosis: Secondary | ICD-10-CM | POA: Diagnosis not present

## 2019-10-29 DIAGNOSIS — L814 Other melanin hyperpigmentation: Secondary | ICD-10-CM | POA: Diagnosis not present

## 2019-11-20 DIAGNOSIS — Z6826 Body mass index (BMI) 26.0-26.9, adult: Secondary | ICD-10-CM | POA: Diagnosis not present

## 2019-11-20 DIAGNOSIS — R7309 Other abnormal glucose: Secondary | ICD-10-CM | POA: Diagnosis not present

## 2019-11-20 DIAGNOSIS — Z713 Dietary counseling and surveillance: Secondary | ICD-10-CM | POA: Diagnosis not present

## 2019-11-20 DIAGNOSIS — E119 Type 2 diabetes mellitus without complications: Secondary | ICD-10-CM | POA: Diagnosis not present

## 2019-11-28 DIAGNOSIS — N184 Chronic kidney disease, stage 4 (severe): Secondary | ICD-10-CM | POA: Diagnosis not present

## 2019-12-03 DIAGNOSIS — N2589 Other disorders resulting from impaired renal tubular function: Secondary | ICD-10-CM | POA: Diagnosis not present

## 2019-12-03 DIAGNOSIS — E1122 Type 2 diabetes mellitus with diabetic chronic kidney disease: Secondary | ICD-10-CM | POA: Diagnosis not present

## 2019-12-03 DIAGNOSIS — N184 Chronic kidney disease, stage 4 (severe): Secondary | ICD-10-CM | POA: Diagnosis not present

## 2019-12-03 DIAGNOSIS — I129 Hypertensive chronic kidney disease with stage 1 through stage 4 chronic kidney disease, or unspecified chronic kidney disease: Secondary | ICD-10-CM | POA: Diagnosis not present

## 2019-12-03 DIAGNOSIS — N2581 Secondary hyperparathyroidism of renal origin: Secondary | ICD-10-CM | POA: Diagnosis not present

## 2019-12-27 DIAGNOSIS — Z8546 Personal history of malignant neoplasm of prostate: Secondary | ICD-10-CM | POA: Diagnosis not present

## 2019-12-27 DIAGNOSIS — I10 Essential (primary) hypertension: Secondary | ICD-10-CM | POA: Diagnosis not present

## 2019-12-27 DIAGNOSIS — E7800 Pure hypercholesterolemia, unspecified: Secondary | ICD-10-CM | POA: Diagnosis not present

## 2019-12-27 DIAGNOSIS — E1121 Type 2 diabetes mellitus with diabetic nephropathy: Secondary | ICD-10-CM | POA: Diagnosis not present

## 2019-12-27 DIAGNOSIS — I2510 Atherosclerotic heart disease of native coronary artery without angina pectoris: Secondary | ICD-10-CM | POA: Diagnosis not present

## 2019-12-27 DIAGNOSIS — E11319 Type 2 diabetes mellitus with unspecified diabetic retinopathy without macular edema: Secondary | ICD-10-CM | POA: Diagnosis not present

## 2019-12-27 DIAGNOSIS — N2581 Secondary hyperparathyroidism of renal origin: Secondary | ICD-10-CM | POA: Diagnosis not present

## 2019-12-27 DIAGNOSIS — Z6826 Body mass index (BMI) 26.0-26.9, adult: Secondary | ICD-10-CM | POA: Diagnosis not present

## 2019-12-27 DIAGNOSIS — Z0000 Encounter for general adult medical examination without abnormal findings: Secondary | ICD-10-CM | POA: Diagnosis not present

## 2019-12-27 DIAGNOSIS — N184 Chronic kidney disease, stage 4 (severe): Secondary | ICD-10-CM | POA: Diagnosis not present

## 2019-12-27 DIAGNOSIS — Z1389 Encounter for screening for other disorder: Secondary | ICD-10-CM | POA: Diagnosis not present

## 2019-12-27 DIAGNOSIS — Z23 Encounter for immunization: Secondary | ICD-10-CM | POA: Diagnosis not present

## 2020-01-22 DIAGNOSIS — R509 Fever, unspecified: Secondary | ICD-10-CM | POA: Diagnosis not present

## 2020-02-04 ENCOUNTER — Encounter (INDEPENDENT_AMBULATORY_CARE_PROVIDER_SITE_OTHER): Payer: Medicare HMO | Admitting: Ophthalmology

## 2020-03-04 DIAGNOSIS — H31009 Unspecified chorioretinal scars, unspecified eye: Secondary | ICD-10-CM | POA: Insufficient documentation

## 2020-03-04 DIAGNOSIS — H3342 Traction detachment of retina, left eye: Secondary | ICD-10-CM | POA: Insufficient documentation

## 2020-03-04 DIAGNOSIS — H33059 Total retinal detachment, unspecified eye: Secondary | ICD-10-CM | POA: Insufficient documentation

## 2020-03-04 DIAGNOSIS — H2702 Aphakia, left eye: Secondary | ICD-10-CM | POA: Insufficient documentation

## 2020-03-04 DIAGNOSIS — E113311 Type 2 diabetes mellitus with moderate nonproliferative diabetic retinopathy with macular edema, right eye: Secondary | ICD-10-CM | POA: Insufficient documentation

## 2020-03-04 DIAGNOSIS — H2511 Age-related nuclear cataract, right eye: Secondary | ICD-10-CM | POA: Insufficient documentation

## 2020-03-04 DIAGNOSIS — H409 Unspecified glaucoma: Secondary | ICD-10-CM | POA: Insufficient documentation

## 2020-03-05 DIAGNOSIS — S8011XA Contusion of right lower leg, initial encounter: Secondary | ICD-10-CM | POA: Diagnosis not present

## 2020-03-05 DIAGNOSIS — S80811A Abrasion, right lower leg, initial encounter: Secondary | ICD-10-CM | POA: Diagnosis not present

## 2020-03-09 ENCOUNTER — Ambulatory Visit (INDEPENDENT_AMBULATORY_CARE_PROVIDER_SITE_OTHER): Payer: Medicare HMO | Admitting: Ophthalmology

## 2020-03-09 ENCOUNTER — Other Ambulatory Visit: Payer: Self-pay

## 2020-03-09 ENCOUNTER — Encounter (INDEPENDENT_AMBULATORY_CARE_PROVIDER_SITE_OTHER): Payer: Self-pay | Admitting: Ophthalmology

## 2020-03-09 DIAGNOSIS — Z794 Long term (current) use of insulin: Secondary | ICD-10-CM | POA: Diagnosis not present

## 2020-03-09 DIAGNOSIS — H2511 Age-related nuclear cataract, right eye: Secondary | ICD-10-CM | POA: Diagnosis not present

## 2020-03-09 DIAGNOSIS — H31009 Unspecified chorioretinal scars, unspecified eye: Secondary | ICD-10-CM | POA: Diagnosis not present

## 2020-03-09 DIAGNOSIS — H3342 Traction detachment of retina, left eye: Secondary | ICD-10-CM | POA: Diagnosis not present

## 2020-03-09 DIAGNOSIS — H33059 Total retinal detachment, unspecified eye: Secondary | ICD-10-CM | POA: Diagnosis not present

## 2020-03-09 DIAGNOSIS — H2702 Aphakia, left eye: Secondary | ICD-10-CM

## 2020-03-09 DIAGNOSIS — H44002 Unspecified purulent endophthalmitis, left eye: Secondary | ICD-10-CM

## 2020-03-09 DIAGNOSIS — E113311 Type 2 diabetes mellitus with moderate nonproliferative diabetic retinopathy with macular edema, right eye: Secondary | ICD-10-CM

## 2020-03-09 NOTE — Progress Notes (Signed)
03/09/2020     CHIEF COMPLAINT Patient presents for Retina Follow Up   HISTORY OF PRESENT ILLNESS: Jason Zimmerman is a 64 y.o. male who presents to the clinic today for:   HPI    Retina Follow Up    Patient presents with  Diabetic Retinopathy.  In both eyes.  This started 2 years ago.  Severity is mild.  Duration of 2 years.  Since onset it is stable.          Comments    2 Year Diabetic F/U OU  Pt denies noticeable changes to [place] OU since last visit. Pt denies ocular pain, flashes of light, or floaters OU.  A1c: 8 "something", 6-7 months ago LBS: 200 a couple days ago       Last edited by Rockie Neighbours, [place] on 03/09/2020  9:45 AM. (History)      Referring physician: Mauricia Area, MD [place],  [place] [id number]  HISTORICAL INFORMATION:   Selected notes from the MEDICAL RECORD NUMBER    Lab Results  Component Value Date   HGBA1C 9.0 (H) 07/09/2012     CURRENT MEDICATIONS: Current Outpatient Medications (Ophthalmic Drugs)  Medication Sig  . timolol (TIMOPTIC) 0.5 % ophthalmic solution Place 1 drop into the left eye daily.   No current facility-administered medications for this visit. (Ophthalmic Drugs)   Current Outpatient Medications (Other)  Medication Sig  . acetaminophen (TYLENOL) 650 MG CR tablet Take 650 mg by mouth every 6 (six) hours as needed for pain.   Marland Kitchen aspirin EC 81 MG tablet Take 81 mg by mouth at bedtime.   Marland Kitchen atorvastatin (LIPITOR) 80 MG tablet Take 80 mg by mouth daily.  . carvedilol (COREG) 3.125 MG tablet Take 3.125 mg by mouth every 12 (twelve) hours.    Marland Kitchen glucose blood (FREESTYLE TEST STRIPS) test strip 1 each by Other route 2 (two) times daily as needed for other. PRN  . Insulin Glargine (LANTUS SOLOSTAR) 100 UNIT/ML Solostar Pen Inject 30 Units into the skin at bedtime.   . nitroGLYCERIN (NITROSTAT) 0.4 MG SL tablet Place 0.4 mg under the tongue every 5 (five) minutes as needed for chest pain.   . pioglitazone (ACTOS) 30  MG tablet Take 1 tablet by mouth daily. *APPOINTMENT NEEDED FOR FURTHER REFILLS*  . ranitidine (ZANTAC) 150 MG tablet Take 150 mg by mouth as needed for heartburn.  . sodium bicarbonate 650 MG tablet Take 1,300 mg by mouth 2 (two) times daily.  . sucralfate (CARAFATE) 1 GM/10ML suspension Take 10 mLs (1 g total) by mouth 4 (four) times daily.   No current facility-administered medications for this visit. (Other)      REVIEW OF SYSTEMS:    ALLERGIES No Known Allergies  PAST MEDICAL HISTORY Past Medical History:  Diagnosis Date  . Acute endophthalmitis   . Anemia associated with chronic renal failure   . Chronic kidney disease, stage 4 (severe) ([place])   . Coronary disease   . Diabetic retinopathy ([place])   . Ganglion and cyst of synovium, tendon, and bursa   . Heart attack ([place]) 10/19/2010  . Hyperlipidemia   . Hypertension, renal   . Ingrowing nail   . Iron (Fe) deficiency anemia   . Other staphylococcus infection in conditions classified elsewhere and of unspecified site   . Prostate cancer ([place]) 2011  . Routine general medical examination at a health care facility   . RTA (renal tubular acidosis)   . Secondary hyperparathyroidism, renal ([place])   .  Special screening for malignant neoplasm of prostate   . Type II or unspecified type diabetes mellitus without mention of complication, not stated as uncontrolled   . Unspecified osteomyelitis, site unspecified   . Vitamin D deficiency    Past Surgical History:  Procedure Laterality Date  . APPENDECTOMY    . BACK SURGERY    . BIOPSY  02/13/2018   Procedure: BIOPSY;  Surgeon: Lavena Bullion, DO;  [facility];  Service: Gastroenterology;;  . CARDIAC VALVE SURGERY  2012   Stent placed  . COLONOSCOPY WITH PROPOFOL N/A 02/13/2018   Procedure: COLONOSCOPY WITH PROPOFOL;  Surgeon: Lavena Bullion, DO;  [facility];  Service: Gastroenterology;  Laterality: N/A;  . ESOPHAGEAL DILATION  02/13/2018    Procedure: ESOPHAGEAL DILATION;  Surgeon: Lavena Bullion, DO;  [facility];  Service: Gastroenterology;;  . ESOPHAGOGASTRODUODENOSCOPY (EGD) WITH PROPOFOL N/A 02/13/2018   Procedure: ESOPHAGOGASTRODUODENOSCOPY (EGD) WITH PROPOFOL;  Surgeon: Lavena Bullion, DO;  [facility];  Service: Gastroenterology;  Laterality: N/A;  . NASAL SEPTUM SURGERY  1981  . PROSTATE SURGERY    . VITRECTOMY  12/21/2006    FAMILY HISTORY Family History  Problem Relation Age of Onset  . Cancer Father        Prostate  . Heart disease Father   . Kidney cancer Maternal Grandmother     SOCIAL HISTORY Social History   Tobacco Use  . Smoking status: Never Smoker  . Smokeless tobacco: Never Used  Vaping Use  . Vaping Use: Never used  Substance Use Topics  . Alcohol use: Not Currently  . Drug use: Never         OPHTHALMIC EXAM: Base Eye Exam    Visual Acuity (ETDRS)      Right Left   Dist [place] 20/20 NLP       Tonometry (Tonopen, 9:46 AM)      Right Left   Pressure 21 42       Pupils      Dark Light Shape React APD   Right 3 2 Round Brisk None   Left 3 2 Irregular Brisk +1       Visual Fields (Counting fingers)      Left Right     Full   Restrictions Total superior temporal, inferior temporal, superior nasal, inferior nasal deficiencies        Extraocular Movement      Right Left    Full Full       Neuro/Psych    Oriented x3: Yes   Mood/Affect: Normal       Dilation    Both eyes: 1.0% Mydriacyl, 2.5% Phenylephrine @ 9:49 AM        Slit Lamp and Fundus Exam    External Exam      Right Left   External Normal Normal       Slit Lamp Exam      Right Left   Lids/Lashes Normal Normal   Conjunctiva/Sclera White and quiet White and quiet   Cornea Clear Clear   Anterior Chamber Deep and quiet Deep and quiet   Iris Round and reactive Round and reactive   Lens Centered posterior chamber intraocular lens Centered posterior chamber intraocular lens    Anterior Vitreous Normal Normal       Fundus Exam      Right Left   Posterior Vitreous Normal Clear silicone oil   Disc Normal 4+ Pallor, Thin rim   C/D Ratio 0.35 1.0  Macula Normal Chronic subfoveal scarring pigmentary, stable   Vessels Normal Normal   Periphery Normal Inferior old CRD, stable          IMAGING AND PROCEDURES  Imaging and Procedures for 03/09/20  OCT, Retina - OU - Both Eyes       Right Eye Quality was good. Scan locations included subfoveal. Central Foveal Thickness: 284. Progression has been stable. Findings include normal foveal contour.   Left Eye Quality was good. Scan locations included subfoveal. Progression has been stable. Findings include abnormal foveal contour.   Notes   Inferior retinal detachment left eye, stable, chronic pigmentary change in the macula no change overall OS                ASSESSMENT/PLAN:  ENDOPHTHALMITIS, ACUTE August 2008, patient had systemic MRSA, sepsis, endogenous endophthalmitis as the onset of his disorder      ICD-10-CM   1. Controlled type 2 diabetes mellitus with right eye affected by moderate nonproliferative retinopathy and macular edema, with long-term current use of insulin (HCC)  E11.3311 OCT, Retina - OU - Both Eyes   Z79.4   2. Traction detachment of left retina  H33.42 OCT, Retina - OU - Both Eyes  3. Age-related nuclear cataract of right eye  H25.11   4. Aphakia of left eye  H27.02   5. 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 Chorioretinal scar, unspecified laterality  H31.009   7. Acute endophthalmitis of left eye  H44.002     1.  Normal right eye  2.  OS, status post endogenous endophthalmitis, 13 years previous.  Status post treatment of infection successfully yet combined traction rhegmatogenous detachment, stabilized long-term with persistent silicone oil in place with no complications of the silicone oil at this time  3.  Acuity also limited left eye by  totally cupped optic nerve, normal intraocular pressures remain however  Ophthalmic Meds Ordered this visit:  No orders of the defined types were placed in this encounter.      Return in about 2 years (around 03/09/2022) for DILATE OU, COLOR FP.  There are no Patient Instructions on file for this visit.   Explained the diagnoses, plan, and follow up with the patient and they expressed understanding.  Patient expressed understanding of the importance of proper follow up care.   Clent Demark Loudon Krakow M.D. Diseases & Surgery of the Retina and Vitreous [facility]ina [facility] Diabetic [place] 03/09/20     Abbreviations: M myopia (nearsighted); A astigmatism; H hyperopia (farsighted); P presbyopia; Mrx spectacle prescription;  CTL contact lenses; OD right eye; OS left eye; OU both eyes  XT exotropia; ET esotropia; PEK punctate epithelial keratitis; PEE punctate epithelial erosions; DES dry eye syndrome; MGD meibomian gland dysfunction; ATs artificial tears; PFAT's preservative free artificial tears; [place] nuclear sclerotic cataract; PSC posterior subcapsular cataract; ERM epi-retinal membrane; PVD posterior vitreous detachment; RD retinal detachment; DM diabetes mellitus; DR diabetic retinopathy; NPDR non-proliferative diabetic retinopathy; PDR proliferative diabetic retinopathy; CSME clinically significant macular edema; DME diabetic macular edema; dbh dot blot hemorrhages; CWS cotton wool spot; POAG primary open angle glaucoma; C/D cup-to-disc ratio; HVF humphrey visual field; GVF goldmann visual field; OCT optical coherence tomography; IOP intraocular pressure; BRVO Branch retinal vein occlusion; CRVO central retinal vein occlusion; CRAO central retinal artery occlusion; BRAO branch retinal artery occlusion; RT retinal tear; SB scleral buckle; PPV pars plana vitrectomy; VH Vitreous hemorrhage; PRP panretinal laser photocoagulation; IVK intravitreal kenalog; VMT vitreomacular traction; MH Macular hole;  NVD  neovascularization of the disc; NVE neovascularization elsewhere; AREDS age related eye disease study; ARMD age related macular degeneration; POAG primary open angle glaucoma; EBMD epithelial/anterior basement membrane dystrophy; ACIOL anterior chamber intraocular lens; IOL intraocular lens; PCIOL posterior chamber intraocular lens; Phaco/IOL phacoemulsification with intraocular lens placement; [place] photorefractive keratectomy; LASIK laser assisted in situ keratomileusis; HTN hypertension; DM diabetes mellitus; COPD chronic obstructive pulmonary disease

## 2020-03-09 NOTE — Assessment & Plan Note (Signed)
August 2008, patient had systemic MRSA, sepsis, endogenous endophthalmitis as the onset of his disorder

## 2020-03-10 DIAGNOSIS — N184 Chronic kidney disease, stage 4 (severe): Secondary | ICD-10-CM | POA: Diagnosis not present

## 2020-03-10 DIAGNOSIS — E782 Mixed hyperlipidemia: Secondary | ICD-10-CM | POA: Diagnosis not present

## 2020-03-10 DIAGNOSIS — E11319 Type 2 diabetes mellitus with unspecified diabetic retinopathy without macular edema: Secondary | ICD-10-CM | POA: Diagnosis not present

## 2020-03-10 DIAGNOSIS — Z794 Long term (current) use of insulin: Secondary | ICD-10-CM | POA: Diagnosis not present

## 2020-03-10 DIAGNOSIS — E1122 Type 2 diabetes mellitus with diabetic chronic kidney disease: Secondary | ICD-10-CM | POA: Diagnosis not present

## 2020-03-10 DIAGNOSIS — I129 Hypertensive chronic kidney disease with stage 1 through stage 4 chronic kidney disease, or unspecified chronic kidney disease: Secondary | ICD-10-CM | POA: Diagnosis not present

## 2020-03-31 DIAGNOSIS — E1121 Type 2 diabetes mellitus with diabetic nephropathy: Secondary | ICD-10-CM | POA: Diagnosis not present

## 2020-03-31 DIAGNOSIS — N184 Chronic kidney disease, stage 4 (severe): Secondary | ICD-10-CM | POA: Diagnosis not present

## 2020-03-31 DIAGNOSIS — I10 Essential (primary) hypertension: Secondary | ICD-10-CM | POA: Diagnosis not present

## 2020-03-31 DIAGNOSIS — Z23 Encounter for immunization: Secondary | ICD-10-CM | POA: Diagnosis not present

## 2020-04-03 DIAGNOSIS — I129 Hypertensive chronic kidney disease with stage 1 through stage 4 chronic kidney disease, or unspecified chronic kidney disease: Secondary | ICD-10-CM | POA: Diagnosis not present

## 2020-04-03 DIAGNOSIS — N2589 Other disorders resulting from impaired renal tubular function: Secondary | ICD-10-CM | POA: Diagnosis not present

## 2020-04-03 DIAGNOSIS — N2581 Secondary hyperparathyroidism of renal origin: Secondary | ICD-10-CM | POA: Diagnosis not present

## 2020-04-03 DIAGNOSIS — N184 Chronic kidney disease, stage 4 (severe): Secondary | ICD-10-CM | POA: Diagnosis not present

## 2020-04-03 DIAGNOSIS — E1122 Type 2 diabetes mellitus with diabetic chronic kidney disease: Secondary | ICD-10-CM | POA: Diagnosis not present

## 2020-06-08 DIAGNOSIS — E785 Hyperlipidemia, unspecified: Secondary | ICD-10-CM | POA: Diagnosis not present

## 2020-06-08 DIAGNOSIS — I25118 Atherosclerotic heart disease of native coronary artery with other forms of angina pectoris: Secondary | ICD-10-CM | POA: Diagnosis not present

## 2020-06-08 DIAGNOSIS — I129 Hypertensive chronic kidney disease with stage 1 through stage 4 chronic kidney disease, or unspecified chronic kidney disease: Secondary | ICD-10-CM | POA: Diagnosis not present

## 2020-06-08 DIAGNOSIS — N184 Chronic kidney disease, stage 4 (severe): Secondary | ICD-10-CM | POA: Diagnosis not present

## 2020-06-19 DIAGNOSIS — E782 Mixed hyperlipidemia: Secondary | ICD-10-CM | POA: Diagnosis not present

## 2020-06-19 DIAGNOSIS — E1122 Type 2 diabetes mellitus with diabetic chronic kidney disease: Secondary | ICD-10-CM | POA: Diagnosis not present

## 2020-06-19 DIAGNOSIS — N184 Chronic kidney disease, stage 4 (severe): Secondary | ICD-10-CM | POA: Diagnosis not present

## 2020-06-19 DIAGNOSIS — I129 Hypertensive chronic kidney disease with stage 1 through stage 4 chronic kidney disease, or unspecified chronic kidney disease: Secondary | ICD-10-CM | POA: Diagnosis not present

## 2020-07-02 DIAGNOSIS — E1165 Type 2 diabetes mellitus with hyperglycemia: Secondary | ICD-10-CM | POA: Diagnosis not present

## 2020-08-07 DIAGNOSIS — I129 Hypertensive chronic kidney disease with stage 1 through stage 4 chronic kidney disease, or unspecified chronic kidney disease: Secondary | ICD-10-CM | POA: Diagnosis not present

## 2020-08-07 DIAGNOSIS — N184 Chronic kidney disease, stage 4 (severe): Secondary | ICD-10-CM | POA: Diagnosis not present

## 2020-08-07 DIAGNOSIS — E1122 Type 2 diabetes mellitus with diabetic chronic kidney disease: Secondary | ICD-10-CM | POA: Diagnosis not present

## 2020-08-07 DIAGNOSIS — N2581 Secondary hyperparathyroidism of renal origin: Secondary | ICD-10-CM | POA: Diagnosis not present

## 2020-09-09 DIAGNOSIS — Z01818 Encounter for other preprocedural examination: Secondary | ICD-10-CM | POA: Diagnosis not present

## 2020-09-09 DIAGNOSIS — E119 Type 2 diabetes mellitus without complications: Secondary | ICD-10-CM | POA: Diagnosis not present

## 2020-09-21 DIAGNOSIS — M7989 Other specified soft tissue disorders: Secondary | ICD-10-CM | POA: Diagnosis not present

## 2020-09-21 DIAGNOSIS — M79672 Pain in left foot: Secondary | ICD-10-CM | POA: Diagnosis not present

## 2020-09-21 DIAGNOSIS — S92515A Nondisplaced fracture of proximal phalanx of left lesser toe(s), initial encounter for closed fracture: Secondary | ICD-10-CM | POA: Diagnosis not present

## 2020-09-21 DIAGNOSIS — Z87828 Personal history of other (healed) physical injury and trauma: Secondary | ICD-10-CM | POA: Diagnosis not present

## 2020-09-21 DIAGNOSIS — M25473 Effusion, unspecified ankle: Secondary | ICD-10-CM | POA: Diagnosis not present

## 2020-10-01 ENCOUNTER — Ambulatory Visit: Payer: BLUE CROSS/BLUE SHIELD | Admitting: Podiatry

## 2020-10-01 ENCOUNTER — Ambulatory Visit (INDEPENDENT_AMBULATORY_CARE_PROVIDER_SITE_OTHER): Payer: Medicare Other

## 2020-10-01 ENCOUNTER — Other Ambulatory Visit: Payer: Self-pay

## 2020-10-01 DIAGNOSIS — M7750 Other enthesopathy of unspecified foot: Secondary | ICD-10-CM

## 2020-10-01 DIAGNOSIS — M778 Other enthesopathies, not elsewhere classified: Secondary | ICD-10-CM | POA: Diagnosis not present

## 2020-10-01 DIAGNOSIS — S92502A Displaced unspecified fracture of left lesser toe(s), initial encounter for closed fracture: Secondary | ICD-10-CM | POA: Diagnosis not present

## 2020-10-01 DIAGNOSIS — S93409S Sprain of unspecified ligament of unspecified ankle, sequela: Secondary | ICD-10-CM

## 2020-10-01 NOTE — Progress Notes (Signed)
  Subjective:  Patient ID: Jason Zimmerman, male    DOB: [birth year],  MRN: [id number]  Chief Complaint  Patient presents with  . Foot Pain    (NP)reestablished, swollen ankle, possible injury-left foot    65 y.o. male presents with the above complaint. History confirmed with patient.  Has had several sprains over the years on both ankles the left is usually worse.  He also has had pain around the fourth toe thinks he may have stubbed it left foot  Objective:  Physical Exam: warm, good capillary refill, no trophic changes or ulcerative lesions, normal DP and PT pulses and normal sensory exam.  Bilateral ankles he does have some ligamentous laxity without gross dislocation or instability.  He has pain on the fourth toe with edema  Radiographs: X-ray of left ankle and foot: Ankle mortise maintained minimal degenerative changes here, no gross incongruity or fracture noted in the ankle, there is a minimally displaced intra-articular fracture of the fourth PIPJ Assessment:   1. Closed fracture of phalanx of left fourth toe, initial encounter   2. Mild ankle sprain, unspecified laterality, sequela      Plan:  Patient was evaluated and treated and all questions answered.  For his ankle instability appears to be largely functional is not particular bothersome for him.  I discussed with him that if this worsens will consider physical therapy for this and possible bracing.  Has a fracture of the fifth toe which I reviewed the radiographs with him.  Discussed treatment of toe fractures including RICE precautions well as buddy splinting if needed.  He will return as needed if it does not improve in the next few months  Return if symptoms worsen or fail to improve.

## 2020-10-05 ENCOUNTER — Encounter: Payer: Self-pay | Admitting: Podiatry

## 2020-12-31 DIAGNOSIS — N2581 Secondary hyperparathyroidism of renal origin: Secondary | ICD-10-CM | POA: Diagnosis not present

## 2020-12-31 DIAGNOSIS — I129 Hypertensive chronic kidney disease with stage 1 through stage 4 chronic kidney disease, or unspecified chronic kidney disease: Secondary | ICD-10-CM | POA: Diagnosis not present

## 2020-12-31 DIAGNOSIS — E1122 Type 2 diabetes mellitus with diabetic chronic kidney disease: Secondary | ICD-10-CM | POA: Diagnosis not present

## 2020-12-31 DIAGNOSIS — N184 Chronic kidney disease, stage 4 (severe): Secondary | ICD-10-CM | POA: Diagnosis not present

## 2021-01-29 DIAGNOSIS — E11319 Type 2 diabetes mellitus with unspecified diabetic retinopathy without macular edema: Secondary | ICD-10-CM | POA: Diagnosis not present

## 2021-01-29 DIAGNOSIS — N184 Chronic kidney disease, stage 4 (severe): Secondary | ICD-10-CM | POA: Diagnosis not present

## 2021-02-07 DIAGNOSIS — S76111A Strain of right quadriceps muscle, fascia and tendon, initial encounter: Secondary | ICD-10-CM | POA: Diagnosis not present

## 2021-02-08 DIAGNOSIS — M25561 Pain in right knee: Secondary | ICD-10-CM | POA: Diagnosis not present

## 2021-02-08 DIAGNOSIS — S76191A Other specified injury of right quadriceps muscle, fascia and tendon, initial encounter: Secondary | ICD-10-CM | POA: Diagnosis not present

## 2021-02-10 DIAGNOSIS — S76191A Other specified injury of right quadriceps muscle, fascia and tendon, initial encounter: Secondary | ICD-10-CM | POA: Diagnosis not present

## 2021-02-12 NOTE — Pre-Procedure Instructions (Signed)
Surgical Instructions    Your procedure is scheduled on Tuesday 02/16/21.   Report to [facility] Main Entrance "A" at 3:30 P.M., then check in with the Admitting office.  Call this number if you have problems the morning of surgery:  [phone]   If you have any questions prior to your surgery date call [phone]: Open Monday-Friday 8am-4pm    Remember:  Do not eat after midnight the night before your surgery  You may drink clear liquids until 2:30 P.M. the day of your surgery.   Clear liquids allowed are: Water, Non-Citrus Juices (without pulp), Carbonated Beverages, Clear Tea, Black Coffee ONLY (NO MILK, CREAM OR POWDERED CREAMER of any kind), and Gatorade  Patient Instructions  The night before surgery:  No food after midnight. ONLY clear liquids after midnight The day of surgery (if you have diabetes): Drink ONE (1) 12 oz G2 given to you in your pre admission testing appointment by 2:30 P.M. the day of surgery. Drink in one sitting. Do not sip.  This drink was given to you during your hospital  pre-op appointment visit.  Nothing else to drink after completing the  12 oz bottle of G2.         If you have questions, please contact your surgeon's office.     Take these medicines the morning of surgery with A SIP OF WATER   atorvastatin (LIPITOR)  carvedilol (COREG)  ezetimibe (ZETIA)  timolol (TIMOPTIC)    Take these medicines if needed:   acetaminophen (TYLENOL)  nitroGLYCERIN (NITROSTAT)   As of today, STOP taking any Aspirin (unless otherwise instructed by your surgeon) Aleve, Naproxen, Ibuprofen, Motrin, Advil, Goody's, BC's, all herbal medications, fish oil, and all vitamins.   WHAT DO I DO ABOUT MY DIABETES MEDICATION?   Do not take oral diabetes medicines (pills) the morning of surgery. DO NOT TAKE pioglitazone (ACTOS) the day of surgery.   THE NIGHT BEFORE SURGERY, 22 units of insulin glargine (LANTUS).    The day of surgery, do not take other  diabetes injectables, including Byetta (exenatide), Bydureon (exenatide ER), Victoza (liraglutide), or Trulicity (dulaglutide).  HOW TO MANAGE YOUR DIABETES BEFORE AND AFTER SURGERY  Why is it important to control my blood sugar before and after surgery? Improving blood sugar levels before and after surgery helps healing and can limit problems. A way of improving blood sugar control is eating a healthy diet by:  Eating less sugar and carbohydrates  Increasing activity/exercise  Talking with your doctor about reaching your blood sugar goals High blood sugars (greater than 180 mg/dL) can raise your risk of infections and slow your recovery, so you will need to focus on controlling your diabetes during the weeks before surgery. Make sure that the doctor who takes care of your diabetes knows about your planned surgery including the date and location.  How do I manage my blood sugar before surgery? Check your blood sugar at least 4 times a day, starting 2 days before surgery, to make sure that the level is not too high or low.  Check your blood sugar the morning of your surgery when you wake up and every 2 hours until you get to the Short Stay unit.  If your blood sugar is less than 70 mg/dL, you will need to treat for low blood sugar: Do not take insulin. Treat a low blood sugar (less than 70 mg/dL) with  cup of clear juice (cranberry or apple), 4 glucose tablets, OR glucose gel. Recheck blood sugar  in 15 minutes after treatment (to make sure it is greater than 70 mg/dL). If your blood sugar is not greater than 70 mg/dL on recheck, call [phone] for further instructions. Report your blood sugar to the short stay nurse when you get to Short Stay.  If you are admitted to the hospital after surgery: Your blood sugar will be checked by the staff and you will probably be given insulin after surgery (instead of oral diabetes medicines) to make sure you have good blood sugar levels. The goal for  blood sugar control after surgery is 80-180 mg/dL.   After your COVID test   You are not required to quarantine however you are required to wear a well-fitting mask when you are out and around people not in your household.  If your mask becomes wet or soiled, replace with a new one.  Wash your hands often with soap and water for 20 seconds or clean your hands with an alcohol-based hand sanitizer that contains at least 60% alcohol.  Do not share personal items.  Notify your provider: if you are in close contact with someone who has COVID  or if you develop a fever of 100.4 or greater, sneezing, cough, sore throat, shortness of breath or body aches.             Do not wear jewelry or makeup Do not wear lotions, powders, perfumes/colognes, or deodorant. Do not shave 48 hours prior to surgery.  Men may shave face and neck. Do not bring valuables to the hospital. DO Not wear nail polish, gel polish, artificial nails, or any other type of covering on natural nails including finger and toenails. If patients have artificial nails, gel coating, etc. that need to be removed by a nail salon, please have this removed prior to surgery or surgery may need to be canceled/delayed if the surgeon/ anesthesia feels like the patient is unable to be adequately monitored.             [facility] is not responsible for any belongings or valuables.  Do NOT Smoke (Tobacco/Vaping)  24 hours prior to your procedure  If you use a CPAP at night, you may bring your mask for your overnight stay.   Contacts, glasses, hearing aids, dentures or partials may not be worn into surgery, please bring cases for these belongings   For patients admitted to the hospital, discharge time will be determined by your treatment team.   Patients discharged the day of surgery will not be allowed to drive home, and someone needs to stay with them for 24 hours.  NO VISITORS WILL BE ALLOWED IN PRE-OP WHERE PATIENTS ARE PREPPED FOR  SURGERY.  ONLY 1 SUPPORT PERSON MAY BE PRESENT IN THE WAITING ROOM WHILE YOU ARE IN SURGERY.  IF YOU ARE TO BE ADMITTED, ONCE YOU ARE IN YOUR ROOM YOU WILL BE ALLOWED TWO (2) VISITORS. 1 (ONE) VISITOR MAY STAY OVERNIGHT BUT MUST ARRIVE TO THE ROOM BY 8pm.  Minor children may have two parents present. Special consideration for safety and communication needs will be reviewed on a case by case basis.  Special instructions:    Oral Hygiene is also important to reduce your risk of infection.  Remember - BRUSH YOUR TEETH THE MORNING OF SURGERY WITH YOUR REGULAR TOOTHPASTE   [place]- Preparing For Surgery  Before surgery, you can play an important role. Because skin is not sterile, your skin needs to be as free of germs as possible. You can  reduce the number of germs on your skin by washing with CHG (chlorahexidine gluconate) Soap before surgery.  CHG is an antiseptic cleaner which kills germs and bonds with the skin to continue killing germs even after washing.     Please do not use if you have an allergy to CHG or antibacterial soaps. If your skin becomes reddened/irritated stop using the CHG.  Do not shave (including legs and underarms) for at least 48 hours prior to first CHG shower. It is OK to shave your face.  Please follow these instructions carefully.     Shower the NIGHT BEFORE SURGERY and the MORNING OF SURGERY with CHG Soap.   If you chose to wash your hair, wash your hair first as usual with your normal shampoo. After you shampoo, rinse your hair and body thoroughly to remove the shampoo.  Then [employer] and genitals (private parts) with your normal soap and rinse thoroughly to remove soap.  After that Use CHG Soap as you would any other liquid soap. You can apply CHG directly to the skin and wash gently with a scrungie or a clean washcloth.   Apply the CHG Soap to your body ONLY FROM THE NECK DOWN.  Do not use on open wounds or open sores. Avoid contact with your eyes, ears, mouth  and genitals (private parts). Wash Face and genitals (private parts)  with your normal soap.   Wash thoroughly, paying special attention to the area where your surgery will be performed.  Thoroughly rinse your body with warm water from the neck down.  DO NOT shower/wash with your normal soap after using and rinsing off the CHG Soap.  Pat yourself dry with a CLEAN TOWEL.  Wear CLEAN PAJAMAS to bed the night before surgery  Place CLEAN SHEETS on your bed the night before your surgery  DO NOT SLEEP WITH PETS.   Day of Surgery:  Take a shower with CHG soap. Wear Clean/Comfortable clothing the morning of surgery Do not apply any deodorants/lotions.   Remember to brush your teeth WITH YOUR REGULAR TOOTHPASTE.   Please read over the following fact sheets that you were given.

## 2021-02-15 ENCOUNTER — Encounter (HOSPITAL_COMMUNITY)
Admission: RE | Admit: 2021-02-15 | Discharge: 2021-02-15 | Disposition: A | Discharge status: Home / Self Care | Payer: Medicare Other | Source: Ambulatory Visit | Attending: Orthopedic Surgery | Admitting: Orthopedic Surgery

## 2021-02-15 ENCOUNTER — Other Ambulatory Visit: Payer: Self-pay

## 2021-02-15 ENCOUNTER — Encounter (HOSPITAL_COMMUNITY): Payer: Self-pay

## 2021-02-15 DIAGNOSIS — N2589 Other disorders resulting from impaired renal tubular function: Secondary | ICD-10-CM | POA: Insufficient documentation

## 2021-02-15 DIAGNOSIS — Z20822 Contact with and (suspected) exposure to covid-19: Secondary | ICD-10-CM | POA: Diagnosis not present

## 2021-02-15 DIAGNOSIS — I2510 Atherosclerotic heart disease of native coronary artery without angina pectoris: Secondary | ICD-10-CM | POA: Diagnosis not present

## 2021-02-15 DIAGNOSIS — N184 Chronic kidney disease, stage 4 (severe): Secondary | ICD-10-CM | POA: Diagnosis not present

## 2021-02-15 DIAGNOSIS — Z955 Presence of coronary angioplasty implant and graft: Secondary | ICD-10-CM | POA: Insufficient documentation

## 2021-02-15 DIAGNOSIS — E11319 Type 2 diabetes mellitus with unspecified diabetic retinopathy without macular edema: Secondary | ICD-10-CM | POA: Diagnosis not present

## 2021-02-15 DIAGNOSIS — Z79899 Other long term (current) drug therapy: Secondary | ICD-10-CM | POA: Insufficient documentation

## 2021-02-15 DIAGNOSIS — E785 Hyperlipidemia, unspecified: Secondary | ICD-10-CM | POA: Insufficient documentation

## 2021-02-15 DIAGNOSIS — S76111A Strain of right quadriceps muscle, fascia and tendon, initial encounter: Secondary | ICD-10-CM | POA: Insufficient documentation

## 2021-02-15 DIAGNOSIS — H5462 Unqualified visual loss, left eye, normal vision right eye: Secondary | ICD-10-CM | POA: Diagnosis not present

## 2021-02-15 DIAGNOSIS — E1121 Type 2 diabetes mellitus with diabetic nephropathy: Secondary | ICD-10-CM | POA: Insufficient documentation

## 2021-02-15 DIAGNOSIS — Z8546 Personal history of malignant neoplasm of prostate: Secondary | ICD-10-CM | POA: Diagnosis not present

## 2021-02-15 DIAGNOSIS — Z794 Long term (current) use of insulin: Secondary | ICD-10-CM | POA: Insufficient documentation

## 2021-02-15 DIAGNOSIS — Z01818 Encounter for other preprocedural examination: Secondary | ICD-10-CM | POA: Diagnosis not present

## 2021-02-15 DIAGNOSIS — E1165 Type 2 diabetes mellitus with hyperglycemia: Secondary | ICD-10-CM | POA: Insufficient documentation

## 2021-02-15 DIAGNOSIS — I129 Hypertensive chronic kidney disease with stage 1 through stage 4 chronic kidney disease, or unspecified chronic kidney disease: Secondary | ICD-10-CM | POA: Diagnosis not present

## 2021-02-15 DIAGNOSIS — Z7982 Long term (current) use of aspirin: Secondary | ICD-10-CM | POA: Diagnosis not present

## 2021-02-15 DIAGNOSIS — X500XXA Overexertion from strenuous movement or load, initial encounter: Secondary | ICD-10-CM | POA: Diagnosis not present

## 2021-02-15 LAB — CBC
HCT: 36.7 % — ABNORMAL LOW (ref 39.0–52.0)
Hemoglobin: 12.4 g/dL — ABNORMAL LOW (ref 13.0–17.0)
MCH: 34.3 pg — ABNORMAL HIGH (ref 26.0–34.0)
MCHC: 33.8 g/dL (ref 30.0–36.0)
MCV: 101.7 fL — ABNORMAL HIGH (ref 80.0–100.0)
Platelets: 110 10*3/uL — ABNORMAL LOW (ref 150–400)
RBC: 3.61 MIL/uL — ABNORMAL LOW (ref 4.22–5.81)
RDW: 12.4 % (ref 11.5–15.5)
WBC: 5.5 10*3/uL (ref 4.0–10.5)
nRBC: 0 % (ref 0.0–0.2)

## 2021-02-15 LAB — HEMOGLOBIN A1C
Hgb A1c MFr Bld: 13.1 % — ABNORMAL HIGH (ref 4.8–5.6)
Mean Plasma Glucose: 329.27 mg/dL

## 2021-02-15 LAB — BASIC METABOLIC PANEL
Anion gap: 10 (ref 5–15)
BUN: 45 mg/dL — ABNORMAL HIGH (ref 8–23)
CO2: 23 mmol/L (ref 22–32)
Calcium: 9 mg/dL (ref 8.9–10.3)
Chloride: 105 mmol/L (ref 98–111)
Creatinine, Ser: 4.25 mg/dL — ABNORMAL HIGH (ref 0.61–1.24)
GFR, Estimated: 15 mL/min — ABNORMAL LOW (ref >60)
Glucose, Bld: 243 mg/dL — ABNORMAL HIGH (ref 70–99)
Potassium: 4.3 mmol/L (ref 3.5–5.1)
Sodium: 138 mmol/L (ref 135–145)

## 2021-02-15 LAB — SURGICAL PCR SCREEN
MRSA, PCR: NEGATIVE
Staphylococcus aureus: NEGATIVE

## 2021-02-15 LAB — GLUCOSE, CAPILLARY: Glucose-Capillary: 245 mg/dL — ABNORMAL HIGH (ref 70–99)

## 2021-02-15 LAB — SARS CORONAVIRUS 2 (TAT 6-24 HRS): SARS Coronavirus 2: NEGATIVE

## 2021-02-15 NOTE — Progress Notes (Signed)
Notified Dr. Stann Mainland scheduler, Caryl Pina, about pt's abnormal lab values-A1C 13.1 and Creatinine of 4.25.

## 2021-02-15 NOTE — Progress Notes (Addendum)
PCP - Dr. Vassie Loll Eyk Cardiologist - Dr. Bishop Limbo  PPM/ICD - n/a  Chest x-ray - n/a EKG - 02/15/21 in PAT Stress Test - 08/18/20 ECHO - 08/18/20 Cardiac Cath - 10/19/2010  Sleep Study - pt denies CPAP - n/a  Fasting Blood Sugar - 185-200 per pt; 245 in PAT Checks Blood Sugar once a day  Blood Thinner Instructions: n/a Aspirin Instructions: per pt, was told to stop taking aspirin on 02/12/21  ERAS Protcol - yes, clears until 2:30pm PRE-SURGERY Ensure or G2- G2  COVID TEST- 02/15/21 in PAT  Anesthesia review: yes, cardiac hx; abnormal lab values  Patient denies shortness of breath, fever, cough and chest pain at PAT appointment   All instructions explained to the patient, with a verbal understanding of the material. Patient agrees to go over the instructions while at home for a better understanding. Patient also instructed to self quarantine after being tested for COVID-19. The opportunity to ask questions was provided.

## 2021-02-15 NOTE — Anesthesia Preprocedure Evaluation (Addendum)
Anesthesia Evaluation  Patient identified by MRN, date of birth, ID band Patient awake    Reviewed: Allergy & Precautions, NPO status , Patient's Chart, lab work & pertinent test results, reviewed documented beta blocker date and time   Airway Mallampati: II  TM Distance: >3 FB Neck ROM: Full    Dental  (+) Teeth Intact, Dental Advisory Given   Pulmonary neg pulmonary ROS,    Pulmonary exam normal breath sounds clear to auscultation       Cardiovascular hypertension, Pt. on home beta blockers and Pt. on medications + CAD, + Past MI and + Cardiac Stents  Normal cardiovascular exam Rhythm:Regular Rate:Normal     Neuro/Psych negative neurological ROS  negative psych ROS   GI/Hepatic Neg liver ROS, GERD  ,  Endo/Other  diabetes, Type 2, Oral Hypoglycemic Agents, Insulin Dependent  Renal/GU Renal InsufficiencyRenal disease     Musculoskeletal Right knee quad tendon tear   Abdominal   Peds  Hematology  (+) Blood dyscrasia (Thrombocytopenia), anemia ,   Anesthesia Other Findings Day of surgery medications reviewed with the patient.  Reproductive/Obstetrics                            Anesthesia Physical Anesthesia Plan  ASA: 3  Anesthesia Plan: General   Post-op Pain Management:  Regional for Post-op pain   Induction: Intravenous  PONV Risk Score and Plan: 2 and Midazolam, Dexamethasone and Ondansetron  Airway Management Planned: LMA  Additional Equipment:   Intra-op Plan:   Post-operative Plan: Extubation in OR  Informed Consent: I have reviewed the patients History and Physical, chart, labs and discussed the procedure including the risks, benefits and alternatives for the proposed anesthesia with the patient or authorized representative who has indicated his/her understanding and acceptance.     Dental advisory given  Plan Discussed with: CRNA  Anesthesia Plan Comments: (PAT  note written 02/15/2021 by Myra Gianotti, PA-C. )       Anesthesia Quick Evaluation

## 2021-02-15 NOTE — Progress Notes (Signed)
Requested labs and last office note from [facility] to be faxed to department per request of Ebony Hail, [place]

## 2021-02-15 NOTE — Progress Notes (Signed)
Anesthesia Chart Review:  Case: [id number] Date/Time: 02/16/21 1715   Procedure: RIGHT KNEE OPEN REPAIR QUADRICEP TENDON (Right)   Anesthesia type: Choice   Pre-op diagnosis: Right knee quad tendon tear   Location: [facility] OR ROOM 05 / [place] OR   Surgeons: Nicholes Stairs, MD       DISCUSSION: Patient is a 65 year old male scheduled for the above procedure on 02/16/2021. PAT RN visit was on 02/15/2021.  Notes indicate that he developed right knee pain after lifting a heavy piece of equipment on 02/01/2021.  Exam findings concerning for quadriceps tendon rupture and was placed in a knee immobilizer and referred to orthopedics.  History includes never smoker, DM2 (uncontrolled; with retinopathy, nephropathy), CKD (stage IV), renal tubular acidosis, HLD, HTN, CAD (anterior STEMI 10/19/2010, s/p LAD stent), anemia, prostate cancer (s/p radical prostatectomy 2011), back surgery. 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   Last cardiology visit in February 2022 with one year follow-up recommended. He had a non-ischemic stress echo in April 2022 as part of renal transplant work-up.  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   Last nephrology visit with Dr. Joelyn Oms was on 12/31/2020. Labs then showed BUN 36, Creatinine 3.39, eGFR 19. He notes historically GRF was < 20 in 03/2019 and 05/2019, although eGRF 22 in 10/2019. As of May 2022, he renal transplant status at [facility] was listed as 7, meaning "they are gaining waiting time but will not be called in for transplant" (due to "stable" renal function). In the meantime he is to work on getting DM better controlled and continue close monitoring of renal function and provide updates as needed.  PAT labs showed Creatinine 4.25, BUN 45, eGFR 15. H/H 12.4/36.7. A1c 13.1%, up from 10.1% on 09/09/20. He reported home fasting CBGs ~ [phone]. Caryl Pina at Dr. Dennie Maizes  office was notified of A1c and Creatinine result.   Known CKD stage IV followed by Dr. Joelyn Oms with recent follow-up. He also had recent transplant evaluation but is status 7. DM control has worsened which was called to surgeon's office. He will get a CBG on arrival. Anesthesia team to evaluate on the day of surgery. Discussed available information with anesthesiologist Oren Bracket, MD.  Preoperative COVID-19 test negative on 02/15/21.    VS: BP 129/70   Pulse 62   Temp 36.7 C   Resp 17   Ht $R'5\' 10"'lY$  (1.778 m)   Wt 82.9 kg   SpO2 100%   BMI 26.21 kg/m    PROVIDERS: Townsend Roger, MD is PCP  - Pearson Grippe, MD is nephrologist  - Bishop Limbo, MD is cardiologist ([facility]). Last visit 06/08/20 with Talbert Cage, NP with one year follow-up recommended. He did have cardiac testing in 07/2020 as part of renal transplant evaluation. - Amalia Greenhouse, MD is endocrinologist ([facility])   LABS: Preoperative labs noted. See DISCUSSION. (all labs ordered are listed, but only abnormal results are displayed)  Labs Reviewed  GLUCOSE, CAPILLARY - Abnormal; Notable for the following components:      Result Value   Glucose-Capillary 245 (*)    All other components within normal limits  HEMOGLOBIN A1C - Abnormal; Notable for the following components:   Hgb A1c MFr Bld 13.1 (*)    All other components within normal limits  BASIC METABOLIC PANEL - Abnormal; Notable for the following components:   Glucose,  Bld 243 (*)    BUN 45 (*)    Creatinine, Ser 4.25 (*)    GFR, Estimated 15 (*)    All other components within normal limits  CBC - Abnormal; Notable for the following components:   RBC 3.61 (*)    Hemoglobin 12.4 (*)    HCT 36.7 (*)    MCV 101.7 (*)    MCH 34.3 (*)    Platelets 110 (*)    All other components within normal limits  SURGICAL PCR SCREEN  SARS CORONAVIRUS 2 (TAT 6-24 HRS)    EKG: 02/15/21: NSR   CV: Stress echo 08/18/20 ([facility]): STRESS ECHO   Normal left ventricular function and global wall motion with stress.  Negative exercise echocardiography for inducible ischemia at target  heart rate.   Echo 08/18/20 ([facility]): SUMMARY  The left ventricular size is normal.  There is normal left ventricular wall thickness.  Left ventricular systolic function is normal.  LV ejection fraction = 55-60%.  No segmental wall motion abnormalities seen in the left ventricle  The right ventricle is normal in size and function.  The left atrium is mildly dilated.  The right atrium is mildly dilated.  There is no significant valvular disease  The IVC is normal in size .  There is no pericardial effusion.    Carotid & Iliac Duplex 08/18/20: Results cannot be viewed in [facility] but were done as part of renal transplant evaluation.    Past Medical History:  Diagnosis Date   Acute endophthalmitis    Anemia associated with chronic renal failure    Chronic kidney disease, stage 4 (severe) (HCC)    Coronary disease    Diabetic retinopathy ([place])    Ganglion and cyst of synovium, tendon, and bursa    Heart attack ([place]) 10/19/2010   Hyperlipidemia    Hypertension, renal    Ingrowing nail    Iron (Fe) deficiency anemia    Other staphylococcus infection in conditions classified elsewhere and of unspecified site    Prostate cancer [facility]) 2011   Routine general medical examination at a health care facility    RTA (renal tubular acidosis)    Secondary hyperparathyroidism, renal ([place])    Special screening for malignant neoplasm of prostate    Type II or unspecified type diabetes mellitus without mention of complication, not stated as uncontrolled    Unspecified osteomyelitis, site unspecified    Vitamin D deficiency     Past Surgical History:  Procedure Laterality Date   APPENDECTOMY     BACK SURGERY     BIOPSY  02/13/2018   Procedure: BIOPSY;  Surgeon: Lavena Bullion, DO;  Location: [facility] ENDOSCOPY;  Service: Gastroenterology;;    COLONOSCOPY WITH PROPOFOL N/A 02/13/2018   Procedure: COLONOSCOPY WITH PROPOFOL;  Surgeon: Lavena Bullion, DO;  Location: [facility] ENDOS[facility];  Service: Gastroenterology;  Laterality: N/A;   CORONARY ANGIOPLASTY     by notes, LAD stent placed 10/2010 in setting of MI   ESOPHAGEAL DILATION  02/13/2018   Procedure: ESOPHAGEAL DILATION;  Surgeon: Lavena Bullion, DO;  Location: [facility];  Service: Gastroenterology;;   ESOPHAGOGASTRODUODENOSCOPY (EGD) WITH PROPOFOL N/A 02/13/2018   Procedure: ESOPHAGOGASTRODUODENOSCOPY (EGD) WITH PROPOFOL;  Surgeon: Lavena Bullion, DO;  Location: [facility];  Service: Gastroenterology;  Laterality: N/A;   NASAL SEPTUM SURGERY  1981   PROSTATE SURGERY     VITRECTOMY  12/21/2006    MEDICATIONS:  acetaminophen (TYLENOL) 500 MG tablet   aspirin EC 81  MG tablet   atorvastatin (LIPITOR) 80 MG tablet   carvedilol (COREG) 3.125 MG tablet   ezetimibe (ZETIA) 10 MG tablet   glucose blood (FREESTYLE TEST STRIPS) test strip   insulin glargine (LANTUS) 100 UNIT/ML Solostar Pen   nitroGLYCERIN (NITROSTAT) 0.4 MG SL tablet   pioglitazone (ACTOS) 30 MG tablet   pioglitazone (ACTOS) 45 MG tablet   sodium bicarbonate 650 MG tablet   sucralfate (CARAFATE) 1 GM/10ML suspension   timolol (TIMOPTIC) 0.5 % ophthalmic solution   No current facility-administered medications for this encounter.    Myra Gianotti, PA-C Surgical Short Stay/Anesthesiology [facility] Phone [phone] [facility] Phone [phone] 02/15/2021 4:40 PM

## 2021-02-16 ENCOUNTER — Encounter (HOSPITAL_COMMUNITY): Payer: Self-pay | Admitting: Orthopedic Surgery

## 2021-02-16 ENCOUNTER — Ambulatory Visit (HOSPITAL_COMMUNITY)
Admission: RE | Admit: 2021-02-16 | Discharge: 2021-02-16 | Disposition: A | Discharge status: Home / Self Care | Payer: Medicare Other | Attending: Orthopedic Surgery | Admitting: Orthopedic Surgery

## 2021-02-16 ENCOUNTER — Ambulatory Visit (HOSPITAL_COMMUNITY): Payer: Medicare Other | Admitting: Anesthesiology

## 2021-02-16 ENCOUNTER — Ambulatory Visit (HOSPITAL_COMMUNITY): Payer: Medicare Other | Admitting: Vascular Surgery

## 2021-02-16 ENCOUNTER — Encounter (HOSPITAL_COMMUNITY)
Admission: RE | Disposition: A | Discharge status: Home / Self Care | Payer: Self-pay | Source: Home / Self Care | Attending: Orthopedic Surgery

## 2021-02-16 DIAGNOSIS — E1122 Type 2 diabetes mellitus with diabetic chronic kidney disease: Secondary | ICD-10-CM | POA: Diagnosis not present

## 2021-02-16 DIAGNOSIS — K219 Gastro-esophageal reflux disease without esophagitis: Secondary | ICD-10-CM | POA: Diagnosis not present

## 2021-02-16 DIAGNOSIS — E785 Hyperlipidemia, unspecified: Secondary | ICD-10-CM | POA: Insufficient documentation

## 2021-02-16 DIAGNOSIS — Z8546 Personal history of malignant neoplasm of prostate: Secondary | ICD-10-CM | POA: Insufficient documentation

## 2021-02-16 DIAGNOSIS — S76111A Strain of right quadriceps muscle, fascia and tendon, initial encounter: Secondary | ICD-10-CM | POA: Diagnosis not present

## 2021-02-16 DIAGNOSIS — Z794 Long term (current) use of insulin: Secondary | ICD-10-CM | POA: Insufficient documentation

## 2021-02-16 DIAGNOSIS — Z79899 Other long term (current) drug therapy: Secondary | ICD-10-CM | POA: Diagnosis not present

## 2021-02-16 DIAGNOSIS — Z7984 Long term (current) use of oral hypoglycemic drugs: Secondary | ICD-10-CM | POA: Diagnosis not present

## 2021-02-16 DIAGNOSIS — X58XXXA Exposure to other specified factors, initial encounter: Secondary | ICD-10-CM | POA: Diagnosis not present

## 2021-02-16 DIAGNOSIS — N2581 Secondary hyperparathyroidism of renal origin: Secondary | ICD-10-CM | POA: Insufficient documentation

## 2021-02-16 DIAGNOSIS — I252 Old myocardial infarction: Secondary | ICD-10-CM | POA: Insufficient documentation

## 2021-02-16 DIAGNOSIS — Z7982 Long term (current) use of aspirin: Secondary | ICD-10-CM | POA: Insufficient documentation

## 2021-02-16 DIAGNOSIS — I129 Hypertensive chronic kidney disease with stage 1 through stage 4 chronic kidney disease, or unspecified chronic kidney disease: Secondary | ICD-10-CM | POA: Insufficient documentation

## 2021-02-16 DIAGNOSIS — G8918 Other acute postprocedural pain: Secondary | ICD-10-CM | POA: Diagnosis not present

## 2021-02-16 DIAGNOSIS — I2510 Atherosclerotic heart disease of native coronary artery without angina pectoris: Secondary | ICD-10-CM | POA: Diagnosis not present

## 2021-02-16 DIAGNOSIS — N184 Chronic kidney disease, stage 4 (severe): Secondary | ICD-10-CM | POA: Diagnosis not present

## 2021-02-16 HISTORY — PX: QUADRICEPS TENDON REPAIR: SHX756

## 2021-02-16 LAB — GLUCOSE, CAPILLARY
Glucose-Capillary: 183 mg/dL — ABNORMAL HIGH (ref 70–99)
Glucose-Capillary: 143 mg/dL — ABNORMAL HIGH (ref 70–99)

## 2021-02-16 SURGERY — REPAIR, TENDON, QUADRICEPS
Anesthesia: General | Site: Knee | Laterality: Right

## 2021-02-16 MED ORDER — OXYCODONE HCL 5 MG PO TABS: 5.0000 mg | ORAL_TABLET | Freq: Once | ORAL | Status: DC | PRN

## 2021-02-16 MED ORDER — 0.9 % SODIUM CHLORIDE (POUR BTL) OPTIME
TOPICAL | Status: DC | PRN
  Administered 2021-02-16: 400 mL

## 2021-02-16 MED ORDER — DEXAMETHASONE SODIUM PHOSPHATE 10 MG/ML IJ SOLN
INTRAMUSCULAR | Status: DC | PRN
  Administered 2021-02-16: 8 mg via INTRAVENOUS

## 2021-02-16 MED ORDER — FENTANYL CITRATE (PF) 250 MCG/5ML IJ SOLN
INTRAMUSCULAR | Status: DC | PRN
  Administered 2021-02-16: 50 ug via INTRAVENOUS

## 2021-02-16 MED ORDER — HYDROMORPHONE HCL 1 MG/ML IJ SOLN: 0.2500 mg | INTRAMUSCULAR | Status: DC | PRN

## 2021-02-16 MED ORDER — PROPOFOL 10 MG/ML IV BOLUS
INTRAVENOUS | Status: AC
  Filled 2021-02-16: qty 20

## 2021-02-16 MED ORDER — ORAL CARE MOUTH RINSE: 15.0000 mL | Freq: Once | OROMUCOSAL | Status: AC

## 2021-02-16 MED ORDER — FENTANYL CITRATE (PF) 100 MCG/2ML IJ SOLN
INTRAMUSCULAR | Status: AC
  Administered 2021-02-16: 50 ug via INTRAVENOUS
  Filled 2021-02-16: qty 2

## 2021-02-16 MED ORDER — MIDAZOLAM HCL 2 MG/2ML IJ SOLN: 1.0000 mg | Freq: Once | INTRAMUSCULAR | Status: AC

## 2021-02-16 MED ORDER — ONDANSETRON HCL 4 MG/2ML IJ SOLN
INTRAMUSCULAR | Status: AC
  Filled 2021-02-16: qty 2

## 2021-02-16 MED ORDER — ONDANSETRON 4 MG PO TBDP: 4.0000 mg | ORAL_TABLET | Freq: Three times a day (TID) | ORAL | 0 refills | Status: DC | PRN

## 2021-02-16 MED ORDER — SODIUM CHLORIDE 0.9 % IV SOLN: INTRAVENOUS | Status: DC

## 2021-02-16 MED ORDER — ACETAMINOPHEN 500 MG PO TABS
1000.0000 mg | ORAL_TABLET | Freq: Once | ORAL | Status: AC
  Administered 2021-02-16: 1000 mg via ORAL
  Filled 2021-02-16: qty 2

## 2021-02-16 MED ORDER — LIDOCAINE 2% (20 MG/ML) 5 ML SYRINGE
INTRAMUSCULAR | Status: DC | PRN
  Administered 2021-02-16: 40 mg via INTRAVENOUS

## 2021-02-16 MED ORDER — OXYCODONE HCL 5 MG/5ML PO SOLN: 5.0000 mg | Freq: Once | ORAL | Status: DC | PRN

## 2021-02-16 MED ORDER — LIDOCAINE 2% (20 MG/ML) 5 ML SYRINGE
INTRAMUSCULAR | Status: AC
  Filled 2021-02-16: qty 5

## 2021-02-16 MED ORDER — PROPOFOL 10 MG/ML IV BOLUS
INTRAVENOUS | Status: DC | PRN
  Administered 2021-02-16: 200 mg via INTRAVENOUS

## 2021-02-16 MED ORDER — FENTANYL CITRATE (PF) 100 MCG/2ML IJ SOLN: 25.0000 ug | INTRAMUSCULAR | Status: DC | PRN

## 2021-02-16 MED ORDER — MIDAZOLAM HCL 2 MG/2ML IJ SOLN
INTRAMUSCULAR | Status: AC
  Administered 2021-02-16: 1 mg via INTRAVENOUS
  Filled 2021-02-16: qty 2

## 2021-02-16 MED ORDER — BUPIVACAINE-EPINEPHRINE (PF) 0.5% -1:200000 IJ SOLN
INTRAMUSCULAR | Status: DC | PRN
  Administered 2021-02-16: 30 mL via PERINEURAL

## 2021-02-16 MED ORDER — AMISULPRIDE (ANTIEMETIC) 5 MG/2ML IV SOLN: 10.0000 mg | Freq: Once | INTRAVENOUS | Status: DC | PRN

## 2021-02-16 MED ORDER — FENTANYL CITRATE (PF) 250 MCG/5ML IJ SOLN
INTRAMUSCULAR | Status: AC
  Filled 2021-02-16: qty 5

## 2021-02-16 MED ORDER — MEPERIDINE HCL 25 MG/ML IJ SOLN: 6.2500 mg | INTRAMUSCULAR | Status: DC | PRN

## 2021-02-16 MED ORDER — CEFAZOLIN SODIUM-DEXTROSE 2-4 GM/100ML-% IV SOLN
2.0000 g | INTRAVENOUS | Status: AC
  Administered 2021-02-16: 2 g via INTRAVENOUS
  Filled 2021-02-16: qty 100

## 2021-02-16 MED ORDER — ONDANSETRON HCL 4 MG/2ML IJ SOLN: 4.0000 mg | Freq: Once | INTRAMUSCULAR | Status: DC | PRN

## 2021-02-16 MED ORDER — LACTATED RINGERS IV SOLN: INTRAVENOUS | Status: DC

## 2021-02-16 MED ORDER — DEXAMETHASONE SODIUM PHOSPHATE 10 MG/ML IJ SOLN
INTRAMUSCULAR | Status: AC
  Filled 2021-02-16: qty 1

## 2021-02-16 MED ORDER — PROMETHAZINE HCL 25 MG/ML IJ SOLN: 6.2500 mg | INTRAMUSCULAR | Status: DC | PRN

## 2021-02-16 MED ORDER — CHLORHEXIDINE GLUCONATE 0.12 % MT SOLN
15.0000 mL | Freq: Once | OROMUCOSAL | Status: AC
  Administered 2021-02-16: 15 mL via OROMUCOSAL
  Filled 2021-02-16: qty 15

## 2021-02-16 MED ORDER — EPHEDRINE SULFATE-NACL 50-0.9 MG/10ML-% IV SOSY
PREFILLED_SYRINGE | INTRAVENOUS | Status: DC | PRN
  Administered 2021-02-16: 10 mg via INTRAVENOUS

## 2021-02-16 MED ORDER — OXYCODONE HCL 5 MG PO TABS: 5.0000 mg | ORAL_TABLET | ORAL | 0 refills | Status: AC | PRN

## 2021-02-16 MED ORDER — ONDANSETRON HCL 4 MG/2ML IJ SOLN
INTRAMUSCULAR | Status: DC | PRN
  Administered 2021-02-16: 4 mg via INTRAVENOUS

## 2021-02-16 MED ORDER — EPHEDRINE 5 MG/ML INJ
INTRAVENOUS | Status: AC
  Filled 2021-02-16: qty 5

## 2021-02-16 MED ORDER — FENTANYL CITRATE (PF) 100 MCG/2ML IJ SOLN: 50.0000 ug | Freq: Once | INTRAMUSCULAR | Status: AC

## 2021-02-16 SURGICAL SUPPLY — 59 items
ADH SKN CLS APL DERMABOND .7 (GAUZE/BANDAGES/DRESSINGS)
ALCOHOL 70% 16 OZ (MISCELLANEOUS) ×1 IMPLANT
ANCH SUT SWLK 19.1X4.75 (Anchor) ×2 IMPLANT
ANCHOR SUT BIO SW 4.75X19.1 (Anchor) ×2 IMPLANT
BAG COUNTER SPONGE SURGICOUNT (BAG) ×2 IMPLANT
BAG DECANTER FOR FLEXI CONT (MISCELLANEOUS) IMPLANT
BAG SPNG CNTER NS LX DISP (BAG) ×1
BLADE SAW SGTL 13.0X1.19X90.0M (BLADE) ×1 IMPLANT
BNDG ELASTIC 6X5.8 VLCR STR LF (GAUZE/BANDAGES/DRESSINGS) ×2 IMPLANT
BOWL SMART MIX CTS (DISPOSABLE) ×1 IMPLANT
CLOTH BEACON ORANGE TIMEOUT ST (SAFETY) ×2 IMPLANT
COVER SURGICAL LIGHT HANDLE (MISCELLANEOUS) ×2 IMPLANT
CUFF TOURN SGL QUICK 34 (TOURNIQUET CUFF) ×2
CUFF TRNQT CYL 34X4.125X (TOURNIQUET CUFF) ×1 IMPLANT
DECANTER SPIKE VIAL GLASS SM (MISCELLANEOUS) ×1 IMPLANT
DERMABOND ADVANCED (GAUZE/BANDAGES/DRESSINGS)
DERMABOND ADVANCED .7 DNX12 (GAUZE/BANDAGES/DRESSINGS) ×1 IMPLANT
DRAPE EXTREMITY T 121X128X90 (DISPOSABLE) ×1 IMPLANT
DRAPE ORTHO SPLIT 77X108 STRL (DRAPES)
DRAPE SURG ORHT 6 SPLT 77X108 (DRAPES) IMPLANT
DRAPE U-SHAPE 47X51 STRL (DRAPES) ×2 IMPLANT
DRESSING AQUACEL AG SP 3.5X10 (GAUZE/BANDAGES/DRESSINGS) ×1 IMPLANT
DRSG AQUACEL AG ADV 3.5X10 (GAUZE/BANDAGES/DRESSINGS) ×1 IMPLANT
DRSG AQUACEL AG SP 3.5X10 (GAUZE/BANDAGES/DRESSINGS) ×2
DURAPREP 26ML APPLICATOR (WOUND CARE) ×3 IMPLANT
GLOVE SRG 8 PF TXTR STRL LF DI (GLOVE) ×2 IMPLANT
GLOVE SURG ENC MOIS LTX SZ7.5 (GLOVE) ×4 IMPLANT
GLOVE SURG UNDER POLY LF SZ8 (GLOVE) ×4
GOWN STRL REUS W/ TWL LRG LVL3 (GOWN DISPOSABLE) ×1 IMPLANT
GOWN STRL REUS W/ TWL XL LVL3 (GOWN DISPOSABLE) ×2 IMPLANT
GOWN STRL REUS W/TWL LRG LVL3 (GOWN DISPOSABLE) ×4
GOWN STRL REUS W/TWL XL LVL3 (GOWN DISPOSABLE) ×4
HANDPIECE INTERPULSE COAX TIP (DISPOSABLE)
IMMOBILIZER KNEE 22  40 CIR (ORTHOPEDIC SUPPLIES) ×2
IMMOBILIZER KNEE 22 40 CIR (ORTHOPEDIC SUPPLIES) IMPLANT
KIT BIO-TENODESIS 3X8 DISP (MISCELLANEOUS) ×4
KIT INSRT BABSR STRL DISP BTN (MISCELLANEOUS) IMPLANT
MANIFOLD NEPTUNE II (INSTRUMENTS) ×2 IMPLANT
NDL TAPERED W/ NITINOL LOOP (MISCELLANEOUS) IMPLANT
NEEDLE TAPERED W/ NITINOL LOOP (MISCELLANEOUS) ×2 IMPLANT
PACK TOTAL JOINT (CUSTOM PROCEDURE TRAY) ×2 IMPLANT
SET HNDPC FAN SPRY TIP SCT (DISPOSABLE) ×1 IMPLANT
SET PAD KNEE POSITIONER (MISCELLANEOUS) ×1 IMPLANT
STRIP CLOSURE SKIN 1/2X4 (GAUZE/BANDAGES/DRESSINGS) ×1 IMPLANT
SUT MNCRL AB 4-0 PS2 18 (SUTURE) ×1 IMPLANT
SUT MNCRL+ AB 3-0 CT1 36 (SUTURE) IMPLANT
SUT MONOCRYL AB 3-0 CT1 36IN (SUTURE) ×2
SUT TIGER TAPE 7 IN WHITE (SUTURE) IMPLANT
SUT VIC AB 0 CT1 27 (SUTURE) ×2
SUT VIC AB 0 CT1 27XBRD ANBCTR (SUTURE) IMPLANT
SUT VIC AB 1 CT1 27 (SUTURE) ×2
SUT VIC AB 1 CT1 27XBRD ANTBC (SUTURE) ×1 IMPLANT
SUT VIC AB 2-0 CT1 27 (SUTURE) ×4
SUT VIC AB 2-0 CT1 TAPERPNT 27 (SUTURE) ×3 IMPLANT
SUT VLOC 180 0 24IN GS25 (SUTURE) ×1 IMPLANT
SYR 50ML LL SCALE MARK (SYRINGE) ×1 IMPLANT
TRAY FOLEY MTR SLVR 16FR STAT (SET/KITS/TRAYS/PACK) ×1 IMPLANT
WATER STERILE IRR 1000ML POUR (IV SOLUTION) ×1 IMPLANT
WRAP KNEE MAXI GEL POST OP (GAUZE/BANDAGES/DRESSINGS) ×2 IMPLANT

## 2021-02-16 NOTE — Progress Notes (Signed)
Orthopedic Tech Progress Note Patient Details:  Jason Zimmerman 02/27/1956 [id number] Called bledsoe brace into [facility] Patient ID: Jason Zimmerman, male   DOB: 05-02-1956, 65 y.o.   MRN: [id number]  Jason Zimmerman 02/16/2021, 5:51 PM

## 2021-02-16 NOTE — Brief Op Note (Signed)
02/16/2021  6:19 PM  PATIENT:  Jason Zimmerman  65 y.o. male  PRE-OPERATIVE DIAGNOSIS:  Right knee quadricep tendon tear  POST-OPERATIVE DIAGNOSIS:  Right knee quadricep tendon tear  PROCEDURE:  Procedure(s): RIGHT KNEE OPEN REPAIR QUADRICEP TENDON (Right)  SURGEON:  Surgeon(s) and Role:    Nicholes Stairs, MD - Primary  PHYSICIAN ASSISTANT: Jonelle Sidle, PA-C   ANESTHESIA:   regional and general  EBL:  5 mL   BLOOD ADMINISTERED:none  DRAINS: none   LOCAL MEDICATIONS USED:  NONE  SPECIMEN:  No Specimen  DISPOSITION OF SPECIMEN:  N/A  COUNTS:  YES  TOURNIQUET:  * Missing tourniquet times found for documented tourniquets in log: [id number] *  DICTATION: .Note written in EPIC  PLAN OF CARE: Discharge to home after PACU  PATIENT DISPOSITION:  PACU - hemodynamically stable.   Delay start of Pharmacological VTE agent (>24hrs) due to surgical blood loss or risk of bleeding: not applicable

## 2021-02-16 NOTE — Anesthesia Procedure Notes (Signed)
Procedure Name: LMA Insertion Date/Time: 02/16/2021 5:37 PM Performed by: Barrington Ellison, CRNA Pre-anesthesia Checklist: Patient identified, Emergency Drugs available, Suction available and Patient being monitored Patient Re-evaluated:Patient Re-evaluated prior to induction Oxygen Delivery Method: Circle System Utilized Preoxygenation: Pre-oxygenation with 100% oxygen Induction Type: IV induction Ventilation: Mask ventilation without difficulty LMA: LMA inserted LMA Size: 5.0 Number of attempts: 1 Placement Confirmation: positive ETCO2 Tube secured with: Tape Dental Injury: Teeth and Oropharynx as per pre-operative assessment

## 2021-02-16 NOTE — Transfer of Care (Signed)
Immediate Anesthesia Transfer of Care Note  Patient: Jason Zimmerman  Procedure(s) Performed: RIGHT KNEE OPEN REPAIR QUADRICEP TENDON (Right: Knee)  Patient Location: PACU  Anesthesia Type:General and Regional  Level of Consciousness: drowsy and patient cooperative  Airway & Oxygen Therapy: Patient Spontanous Breathing and Patient connected to nasal cannula oxygen  Post-op Assessment: Report given to RN  Post vital signs: Reviewed and stable  Last Vitals:  Vitals Value Taken Time  BP 133/87 02/16/21 1846  Temp    Pulse 57 02/16/21 1846  Resp 12 02/16/21 1846  SpO2 99 % 02/16/21 1846  Vitals shown include unvalidated device data.  Last Pain:  Vitals:   02/16/21 1657  PainSc: 0-No pain      Patients Stated Pain Goal: 0 (80/88/11 0315)  Complications: No notable events documented.

## 2021-02-16 NOTE — Op Note (Addendum)
Date of Surgery: 02/16/2021  INDICATIONS: Jason Zimmerman is a 65 y.o.-year-old male with a right high grade partial quzad tendon tear.  He sustained a flexion type injury and was noted on ultrasound to have greater than 75% thickness quadriceps tendon tear.  He was indicated for surgery due to inability to extend as well as the imminent nature of his injury with converting to a complete rupture.;  The Patient did consent to the procedure after discussion of the risks and benefits.  PREOPERATIVE DIAGNOSIS: Right high-grade quadriceps tendon tear  POSTOPERATIVE DIAGNOSIS: Same.  PROCEDURE: Right open quadriceps tendon repair  SURGEON: Geralynn Rile, M.D.  ASSIST: Jonelle Sidle, PA-C  Assistant attestation:  PA Mcclung present for the entire procedure and participated in all critical portions..  ANESTHESIA:  general, adductor canal block  IV FLUIDS AND URINE: See anesthesia.  ESTIMATED BLOOD LOSS: 5 mL.  IMPLANTS: Arthrex 4.75 mm bio composite swivel lock anchor x2 Arthrex fiber tape suture x1  DRAINS: None  COMPLICATIONS: None.  PREOPERATIVE INDICATIONS: Jason Zimmerman with a diagnosis of right quadriceps rupture who elected for surgical management in order to restore the function of the extensor mechanism.     The risks benefits and alternatives were discussed with the patient preoperatively including but not limited to the risks of infection, bleeding, nerve injury, cardiopulmonary complications, the need for revision surgery, hardware prominence, hardware failure, the need for hardware removal, nonunion, malunion, posttraumatic arthritis, stiffness, loss of strength and function, among others, and the patient was willing to proceed.     OPERATIVE PROCEDURE: The patient was brought to the operating room and placed in the supine position. General anesthesia was administered. IV antibiotics were given. The lower extremity was prepped and draped in usual sterile fashion.  Given  patient's large body habitus a tourniquet was not able to be used.  Time out was performed.    Anterior incision was made over the patella and quadriceps tendon.  Dissection was carried down to the layer of the prepatellar retinaculum.  Proximal dissection was carried forth to the level of the tear.  The tendon was noted to be centrally high-grade partially torn.  There was some anterior tendon intact.  Middle palpation we felt a significant defect.  We sharply completed the tear off of the superior pole of the patella.  On palpation we did note a little bit of retraction of the intrasubstance.  There was no retinacular involvement.    Once the adhesions were released I then utilized to Arthrex fiber tape sutures to run a Krakw running locking suture figuration on the medial half up and down to the tendon stump.  I then repeated this on the lateral half running a Krakw style suture up and down.  This left knee with 2 suture limbs.   Next we prepared the superior pole of the patella.  We debrided the stump from the superior pole.  All devitalized tissue was removed.  We next used rondure your to biologically prepare the superior pole bone.   Next we moved to secure the suture limbs into the superior pole of the patella.  We prepared for the placement of 2 parallel 4.75 mm Arthrex swivel lock anchors.  Drill pins were placed.  We then overdrilled with the appropriate cannulated drill bit to a depth of 20 mm.  We next used the hand tap to prepare the drill sockets.    The lateral  suture limb was loaded into the lateral swivel lock and the  medial suture was loaded into the medial swivel lock.  These were secured into the bone and had excellent purchase.  Next to the free limbs of the suture tapes as well as the free limbs of the suture anchor #2 FiberWire were then ran back into the tendon in a horizontal mattress configuration and tied down to completely secure the tendon to bone interface.  This was  repeated on both the medial and lateral side.   We then moved to close the retinacular tissue.  This was performed with a #2 FiberWire and a running locking fashion.  Following complete closure of the extensor mechanism and the retinaculum I was able to passively flex the knee to about 30 degrees with no gapping noted at the tendon bone interface.   The wounds were irrigated copiously.  The deep fat layer was closed with a running 0 Vicryl.  Deep dermal tissue was closed with interrupted 2-0 Vicryl.  Skin was reapproximated and closed with staples.  The wounds were also injected.  Standard sterile bandages were applied.  Patient was placed in a Bledsoe style hinged knee brace locked in full extension. The patient was awakened and returned to the PACU in stable and satisfactory condition. There were no complications.      Disposition:   Patient was transferred to PACU in stable condition.  He will be weightbearing as tolerated to the right lower extremity with the leg locked in full extension.  He will be placed in observation given his complex medical morbidities to assure that he is stable overnight.  We will likely discharge him home tomorrow.  He will follow-up in my office in 2 to 3 weeks for wound check and staple removal.

## 2021-02-16 NOTE — Discharge Instructions (Signed)
-   Maintain postoperative bandage until your follow-up appointment.  You may shower with this beginning on postoperative day #1.  Please do not submerge underwater.  -Maintain your leg in your brace with full extension until your follow-up appointment in 2 weeks.  You may bear weight on the right leg with the brace on and using crutches.  Do not bend the knee until instructed to do so by your therapist.  -Apply ice to the right knee for 20 to 30 minutes around-the-clock as able out of each hour.  -For mild to moderate pain take Tylenol and Advil around-the-clock.  For breakthrough pain use oxycodone as necessary.  -For the prevention of blood clots take an 81 mg aspirin once per day for 6 weeks after surgery.  -You should keep your postoperative dressings in place for 2 days.  You may begin showering on postoperative day 1 or 2.  Once you are out of the shower pat the leg dry and rewrap with an Ace bandage.  -Return to see Dr. Stann Mainland in the office in 2 weeks

## 2021-02-16 NOTE — H&P (Signed)
ORTHOPAEDIC H and P  REQUESTING PHYSICIAN: Nicholes Stairs, MD  PCP:  Nona Dell, Corene Cornea, MD  Chief Complaint: Right knee pain  HPI: Jason Zimmerman is a 65 y.o. male who complains of right knee pain and weakness with inability to completely extend the knee as well as buckling of the knee following an injury a couple weeks ago.  He was noted to have high-grade tearing of the quadriceps tendon with need for surgical repair.  He is here today for quadriceps tendon repair on the right knee.  No new complaints at this time.  Past Medical History:  Diagnosis Date   Acute endophthalmitis    Anemia associated with chronic renal failure    Chronic kidney disease, stage 4 (severe) (HCC)    Coronary disease    Diabetic retinopathy ([place])    Ganglion and cyst of synovium, tendon, and bursa    Heart attack ([place]) 10/19/2010   Hyperlipidemia    Hypertension, renal    Ingrowing nail    Iron (Fe) deficiency anemia    Other staphylococcus infection in conditions classified elsewhere and of unspecified site    Prostate cancer [facility]) 2011   Routine general medical examination at a health care facility    RTA (renal tubular acidosis)    Secondary hyperparathyroidism, renal ([place])    Special screening for malignant neoplasm of prostate    Type II or unspecified type diabetes mellitus without mention of complication, not stated as uncontrolled    Unspecified osteomyelitis, site unspecified    Vitamin D deficiency    Past Surgical History:  Procedure Laterality Date   APPENDECTOMY     BACK SURGERY     BIOPSY  02/13/2018   Procedure: BIOPSY;  Surgeon: Lavena Bullion, DO;  Location: [facility];  Service: Gastroenterology;;   COLONOSCOPY WITH PROPOFOL N/A 02/13/2018   Procedure: COLONOSCOPY WITH PROPOFOL;  Surgeon: Lavena Bullion, DO;  Location: [facility];  Service: Gastroenterology;  Laterality: N/A;   CORONARY ANGIOPLASTY     by notes, LAD stent placed 10/2010 in setting of MI    ESOPHAGEAL DILATION  02/13/2018   Procedure: ESOPHAGEAL DILATION;  Surgeon: Lavena Bullion, DO;  Location: [facility];  Service: Gastroenterology;;   ESOPHAGOGASTRODUODENOSCOPY (EGD) WITH PROPOFOL N/A 02/13/2018   Procedure: ESOPHAGOGASTRODUODENOSCOPY (EGD) WITH PROPOFOL;  Surgeon: Lavena Bullion, DO;  Location: [facility];  Service: Gastroenterology;  Laterality: N/A;   NASAL SEPTUM SURGERY  1981   PROSTATE SURGERY     VITRECTOMY  12/21/2006   Social History   Socioeconomic History   Marital status: Married    Spouse name: Not on file   Number of children: Not on file   Years of education: Not on file   Highest education level: Not on file  Occupational History   Not on file  Tobacco Use   Smoking status: Never   Smokeless tobacco: Never  Vaping Use   Vaping Use: Never used  Substance and Sexual Activity   Alcohol use: Not Currently   Drug use: Never   Sexual activity: Not on file  Other Topics Concern   Not on file  Social History Narrative   Not on file   Social Determinants of Health   Financial Resource Strain: Not on file  Food Insecurity: Not on file  Transportation Needs: Not on file  Physical Activity: Not on file  Stress: Not on file  Social Connections: Not on file   Family History  Problem Relation Age of Onset  Cancer Father        Prostate   Heart disease Father    Kidney cancer Maternal Grandmother    No Known Allergies Prior to Admission medications   Medication Sig Start Date End Date Taking? Authorizing Provider  acetaminophen (TYLENOL) 500 MG tablet Take 1,000 mg by mouth every 8 (eight) hours as needed for moderate pain.   Yes [provider]  aspirin EC 81 MG tablet Take 81 mg by mouth daily.   Yes [provider]  atorvastatin (LIPITOR) 80 MG tablet Take 80 mg by mouth daily.   Yes [provider]  carvedilol (COREG) 3.125 MG tablet Take 3.125 mg by mouth every 12 (twelve) hours.     Yes [provider]  ezetimibe (ZETIA) 10 MG tablet Take 10 mg by mouth daily.   Yes [provider]  insulin glargine (LANTUS) 100 UNIT/ML Solostar Pen Inject 45 Units into the skin at bedtime. 12/24/15  Yes [provider]  nitroGLYCERIN (NITROSTAT) 0.4 MG SL tablet Place 0.4 mg under the tongue every 5 (five) minutes as needed for chest pain.    Yes [provider]  pioglitazone (ACTOS) 30 MG tablet Take 1 tablet by mouth daily. *APPOINTMENT NEEDED FOR FURTHER REFILLS* 04/22/14  Yes Renato Shin, MD  pioglitazone (ACTOS) 45 MG tablet Take 45 mg by mouth daily.   Yes [provider]  sodium bicarbonate 650 MG tablet Take 1,300 mg by mouth 2 (two) times daily.   Yes [provider]  timolol (TIMOPTIC) 0.5 % ophthalmic solution Place 1 drop into the left eye 2 (two) times daily. 01/11/18  Yes [provider]  glucose blood (FREESTYLE TEST STRIPS) test strip 1 each by Other route 2 (two) times daily as needed for other. PRN 11/11/10   Renato Shin, MD  sucralfate (CARAFATE) 1 GM/10ML suspension Take 10 mLs (1 g total) by mouth 4 (four) times daily. 02/13/18 02/13/19  Cirigliano, Vito V, DO   No results found.  Positive ROS: All other systems have been reviewed and were otherwise negative with the exception of those mentioned in the HPI and as above.  Physical Exam: General: Alert, no acute distress Cardiovascular: No pedal edema Respiratory: No cyanosis, no use of accessory musculature GI: No organomegaly, abdomen is soft and non-tender Skin: No lesions in the area of chief complaint Neurologic: Sensation intact distally Psychiatric: Patient is competent for consent with normal mood and affect Lymphatic: No axillary or cervical lymphadenopathy  MUSCULOSKELETAL: Right leg is warm and well-perfused with no open wounds or skin lesions.  Neurovascular intact.  Assessment: Right leg quadriceps tendon rupture  Plan: -Plan to proceed today with  open quadriceps tendon repair.  We discussed the risk of bleeding, infection, damage to surrounding nerves and vessels, stiffness, failure of repair, need for further surgery, extensor lag, DVT, as well as risk of anesthesia.  He has provided informed consent.  -We will plan for discharge home postoperatively from PACU.    Nicholes Stairs, MD Cell [phone]    02/16/2021 5:18 PM

## 2021-02-16 NOTE — Anesthesia Postprocedure Evaluation (Signed)
Anesthesia Post Note  Patient: Avyn Coate  Procedure(s) Performed: RIGHT KNEE OPEN REPAIR QUADRICEP TENDON (Right: Knee)     Patient location during evaluation: PACU Anesthesia Type: General Level of consciousness: awake and alert Pain management: pain level controlled Vital Signs Assessment: post-procedure vital signs reviewed and stable Respiratory status: spontaneous breathing, nonlabored ventilation, respiratory function stable and patient connected to nasal cannula oxygen Cardiovascular status: blood pressure returned to baseline and stable Postop Assessment: no apparent nausea or vomiting Anesthetic complications: no   No notable events documented.  Last Vitals:  Vitals:   02/16/21 1900 02/16/21 1915  BP: (!) 158/73 (!) 151/89  Pulse: (!) 56 (!) 59  Resp: 15 16  Temp:  37.2 C  SpO2: 100% 99%    Last Pain:  Vitals:   02/16/21 1915  PainSc: 0-No pain                 Tiajuana Amass

## 2021-02-16 NOTE — Anesthesia Procedure Notes (Signed)
Anesthesia Regional Block: Adductor canal block   Pre-Anesthetic Checklist: , timeout performed,  Correct Patient, Correct Site, Correct Laterality,  Correct Procedure, Correct Position, site marked,  Risks and benefits discussed,  Surgical consent,  Pre-op evaluation,  At surgeon's request and post-op pain management  Laterality: Right  Prep: chloraprep       Needles:  Injection technique: Single-shot  Needle Type: Echogenic Needle     Needle Length: 9cm  Needle Gauge: 21     Additional Needles:   Procedures:,,,, ultrasound used (permanent image in chart),,    Narrative:  Start time: 02/16/2021 4:38 PM End time: 02/16/2021 4:45 PM Injection made incrementally with aspirations every 5 mL.  Performed by: Personally  Anesthesiologist: Catalina Gravel, MD  Additional Notes: No pain on injection. No increased resistance to injection. Injection made in 5cc increments.  Good needle visualization.  Patient tolerated procedure well.

## 2021-02-16 NOTE — Progress Notes (Signed)
Patient stable in phase 2 at 1915, no further vitals necessary waiting on ortho to bring bledsoe brace.

## 2021-02-17 ENCOUNTER — Encounter (HOSPITAL_COMMUNITY): Payer: Self-pay | Admitting: Orthopedic Surgery

## 2021-02-17 DIAGNOSIS — Z4789 Encounter for other orthopedic aftercare: Secondary | ICD-10-CM | POA: Diagnosis not present

## 2021-02-19 DIAGNOSIS — Z4789 Encounter for other orthopedic aftercare: Secondary | ICD-10-CM | POA: Diagnosis not present

## 2021-02-19 DIAGNOSIS — M25561 Pain in right knee: Secondary | ICD-10-CM | POA: Diagnosis not present

## 2021-02-19 DIAGNOSIS — M25562 Pain in left knee: Secondary | ICD-10-CM | POA: Diagnosis not present

## 2021-02-21 DIAGNOSIS — M25561 Pain in right knee: Secondary | ICD-10-CM | POA: Diagnosis not present

## 2021-03-04 DIAGNOSIS — M25561 Pain in right knee: Secondary | ICD-10-CM | POA: Diagnosis not present

## 2021-03-04 DIAGNOSIS — Z4789 Encounter for other orthopedic aftercare: Secondary | ICD-10-CM | POA: Diagnosis not present

## 2021-03-04 DIAGNOSIS — M6281 Muscle weakness (generalized): Secondary | ICD-10-CM | POA: Diagnosis not present

## 2021-03-17 DIAGNOSIS — Z4789 Encounter for other orthopedic aftercare: Secondary | ICD-10-CM | POA: Diagnosis not present

## 2021-03-17 DIAGNOSIS — M6281 Muscle weakness (generalized): Secondary | ICD-10-CM | POA: Diagnosis not present

## 2021-03-17 DIAGNOSIS — M25561 Pain in right knee: Secondary | ICD-10-CM | POA: Diagnosis not present

## 2021-03-18 DIAGNOSIS — M25562 Pain in left knee: Secondary | ICD-10-CM | POA: Diagnosis not present

## 2021-03-19 DIAGNOSIS — Z79899 Other long term (current) drug therapy: Secondary | ICD-10-CM | POA: Diagnosis not present

## 2021-03-19 DIAGNOSIS — R600 Localized edema: Secondary | ICD-10-CM | POA: Diagnosis not present

## 2021-03-19 DIAGNOSIS — K5900 Constipation, unspecified: Secondary | ICD-10-CM | POA: Diagnosis not present

## 2021-03-19 DIAGNOSIS — R739 Hyperglycemia, unspecified: Secondary | ICD-10-CM | POA: Diagnosis not present

## 2021-03-19 DIAGNOSIS — K4020 Bilateral inguinal hernia, without obstruction or gangrene, not specified as recurrent: Secondary | ICD-10-CM | POA: Diagnosis not present

## 2021-03-19 DIAGNOSIS — R531 Weakness: Secondary | ICD-10-CM | POA: Diagnosis not present

## 2021-03-19 DIAGNOSIS — K5641 Fecal impaction: Secondary | ICD-10-CM | POA: Diagnosis not present

## 2021-03-19 DIAGNOSIS — I700 Atherosclerosis of aorta: Secondary | ICD-10-CM | POA: Diagnosis not present

## 2021-04-02 DIAGNOSIS — J101 Influenza due to other identified influenza virus with other respiratory manifestations: Secondary | ICD-10-CM | POA: Diagnosis not present

## 2021-05-14 DIAGNOSIS — N184 Chronic kidney disease, stage 4 (severe): Secondary | ICD-10-CM | POA: Diagnosis not present

## 2021-05-18 DIAGNOSIS — E1122 Type 2 diabetes mellitus with diabetic chronic kidney disease: Secondary | ICD-10-CM | POA: Diagnosis not present

## 2021-05-18 DIAGNOSIS — N2581 Secondary hyperparathyroidism of renal origin: Secondary | ICD-10-CM | POA: Diagnosis not present

## 2021-05-18 DIAGNOSIS — N184 Chronic kidney disease, stage 4 (severe): Secondary | ICD-10-CM | POA: Diagnosis not present

## 2021-05-18 DIAGNOSIS — I129 Hypertensive chronic kidney disease with stage 1 through stage 4 chronic kidney disease, or unspecified chronic kidney disease: Secondary | ICD-10-CM | POA: Diagnosis not present

## 2021-06-30 DIAGNOSIS — M545 Low back pain, unspecified: Secondary | ICD-10-CM | POA: Diagnosis not present

## 2021-07-06 DIAGNOSIS — N184 Chronic kidney disease, stage 4 (severe): Secondary | ICD-10-CM | POA: Diagnosis not present

## 2021-07-06 DIAGNOSIS — E1165 Type 2 diabetes mellitus with hyperglycemia: Secondary | ICD-10-CM | POA: Diagnosis not present

## 2021-07-06 DIAGNOSIS — E11319 Type 2 diabetes mellitus with unspecified diabetic retinopathy without macular edema: Secondary | ICD-10-CM | POA: Diagnosis not present

## 2021-07-06 DIAGNOSIS — Z794 Long term (current) use of insulin: Secondary | ICD-10-CM | POA: Diagnosis not present

## 2021-07-06 DIAGNOSIS — E1122 Type 2 diabetes mellitus with diabetic chronic kidney disease: Secondary | ICD-10-CM | POA: Diagnosis not present

## 2021-07-14 DIAGNOSIS — E1122 Type 2 diabetes mellitus with diabetic chronic kidney disease: Secondary | ICD-10-CM | POA: Diagnosis not present

## 2021-08-14 DIAGNOSIS — E1122 Type 2 diabetes mellitus with diabetic chronic kidney disease: Secondary | ICD-10-CM | POA: Diagnosis not present

## 2021-09-13 DIAGNOSIS — E1122 Type 2 diabetes mellitus with diabetic chronic kidney disease: Secondary | ICD-10-CM | POA: Diagnosis not present

## 2021-09-22 DIAGNOSIS — I129 Hypertensive chronic kidney disease with stage 1 through stage 4 chronic kidney disease, or unspecified chronic kidney disease: Secondary | ICD-10-CM | POA: Diagnosis not present

## 2021-09-22 DIAGNOSIS — E1122 Type 2 diabetes mellitus with diabetic chronic kidney disease: Secondary | ICD-10-CM | POA: Diagnosis not present

## 2021-09-22 DIAGNOSIS — N184 Chronic kidney disease, stage 4 (severe): Secondary | ICD-10-CM | POA: Diagnosis not present

## 2021-09-22 DIAGNOSIS — N2581 Secondary hyperparathyroidism of renal origin: Secondary | ICD-10-CM | POA: Diagnosis not present

## 2021-09-29 DIAGNOSIS — N184 Chronic kidney disease, stage 4 (severe): Secondary | ICD-10-CM | POA: Diagnosis not present

## 2021-09-29 DIAGNOSIS — N189 Chronic kidney disease, unspecified: Secondary | ICD-10-CM | POA: Diagnosis not present

## 2021-10-06 DIAGNOSIS — N184 Chronic kidney disease, stage 4 (severe): Secondary | ICD-10-CM | POA: Diagnosis not present

## 2021-10-06 DIAGNOSIS — D631 Anemia in chronic kidney disease: Secondary | ICD-10-CM | POA: Diagnosis not present

## 2021-10-06 DIAGNOSIS — E1122 Type 2 diabetes mellitus with diabetic chronic kidney disease: Secondary | ICD-10-CM | POA: Diagnosis not present

## 2021-10-06 DIAGNOSIS — I129 Hypertensive chronic kidney disease with stage 1 through stage 4 chronic kidney disease, or unspecified chronic kidney disease: Secondary | ICD-10-CM | POA: Diagnosis not present

## 2021-10-12 DIAGNOSIS — N189 Chronic kidney disease, unspecified: Secondary | ICD-10-CM | POA: Diagnosis not present

## 2021-10-14 DIAGNOSIS — E1122 Type 2 diabetes mellitus with diabetic chronic kidney disease: Secondary | ICD-10-CM | POA: Diagnosis not present

## 2021-10-25 ENCOUNTER — Ambulatory Visit (INDEPENDENT_AMBULATORY_CARE_PROVIDER_SITE_OTHER): Payer: Medicare Other | Admitting: Ophthalmology

## 2021-10-25 ENCOUNTER — Encounter (INDEPENDENT_AMBULATORY_CARE_PROVIDER_SITE_OTHER): Payer: Self-pay | Admitting: Ophthalmology

## 2021-10-25 DIAGNOSIS — H211X2 Other vascular disorders of iris and ciliary body, left eye: Secondary | ICD-10-CM | POA: Insufficient documentation

## 2021-10-25 DIAGNOSIS — H44002 Unspecified purulent endophthalmitis, left eye: Secondary | ICD-10-CM

## 2021-10-25 DIAGNOSIS — H2702 Aphakia, left eye: Secondary | ICD-10-CM

## 2021-10-25 DIAGNOSIS — H2511 Age-related nuclear cataract, right eye: Secondary | ICD-10-CM | POA: Diagnosis not present

## 2021-10-25 DIAGNOSIS — E113311 Type 2 diabetes mellitus with moderate nonproliferative diabetic retinopathy with macular edema, right eye: Secondary | ICD-10-CM | POA: Diagnosis not present

## 2021-10-25 DIAGNOSIS — Z794 Long term (current) use of insulin: Secondary | ICD-10-CM | POA: Diagnosis not present

## 2021-10-25 MED ORDER — TIMOLOL MALEATE 0.5 % OP SOLN: 1.0000 [drp] | Freq: Two times a day (BID) | OPHTHALMIC | 2 refills | Status: AC

## 2021-10-25 NOTE — Assessment & Plan Note (Signed)
Stable as a consequence of treatment of endophthalmitis no retinal detachment at that time 2008

## 2021-10-25 NOTE — Assessment & Plan Note (Signed)
Stable over time.  1 quadrant posterior active.  No peripheral quadrants active.  We will continue to monitor

## 2021-10-25 NOTE — Assessment & Plan Note (Signed)
Minor lens opacity at this time continue to observe

## 2021-10-26 ENCOUNTER — Encounter: Payer: Self-pay | Admitting: Gastroenterology

## 2021-10-26 ENCOUNTER — Telehealth: Payer: Self-pay

## 2021-11-11 DIAGNOSIS — N184 Chronic kidney disease, stage 4 (severe): Secondary | ICD-10-CM | POA: Diagnosis not present

## 2021-11-13 DIAGNOSIS — E1122 Type 2 diabetes mellitus with diabetic chronic kidney disease: Secondary | ICD-10-CM | POA: Diagnosis not present

## 2021-11-15 DIAGNOSIS — N189 Chronic kidney disease, unspecified: Secondary | ICD-10-CM | POA: Diagnosis not present

## 2021-11-15 DIAGNOSIS — D631 Anemia in chronic kidney disease: Secondary | ICD-10-CM | POA: Diagnosis not present

## 2021-11-17 DIAGNOSIS — D631 Anemia in chronic kidney disease: Secondary | ICD-10-CM | POA: Diagnosis not present

## 2021-11-17 DIAGNOSIS — N184 Chronic kidney disease, stage 4 (severe): Secondary | ICD-10-CM | POA: Diagnosis not present

## 2021-11-22 DIAGNOSIS — N189 Chronic kidney disease, unspecified: Secondary | ICD-10-CM | POA: Diagnosis not present

## 2021-11-22 DIAGNOSIS — D631 Anemia in chronic kidney disease: Secondary | ICD-10-CM | POA: Diagnosis not present

## 2021-11-24 DIAGNOSIS — I252 Old myocardial infarction: Secondary | ICD-10-CM | POA: Diagnosis not present

## 2021-11-24 DIAGNOSIS — I129 Hypertensive chronic kidney disease with stage 1 through stage 4 chronic kidney disease, or unspecified chronic kidney disease: Secondary | ICD-10-CM | POA: Diagnosis not present

## 2021-11-24 DIAGNOSIS — E1122 Type 2 diabetes mellitus with diabetic chronic kidney disease: Secondary | ICD-10-CM | POA: Diagnosis not present

## 2021-11-24 DIAGNOSIS — I25118 Atherosclerotic heart disease of native coronary artery with other forms of angina pectoris: Secondary | ICD-10-CM | POA: Diagnosis not present

## 2021-11-26 DIAGNOSIS — D631 Anemia in chronic kidney disease: Secondary | ICD-10-CM | POA: Diagnosis not present

## 2021-11-26 DIAGNOSIS — I120 Hypertensive chronic kidney disease with stage 5 chronic kidney disease or end stage renal disease: Secondary | ICD-10-CM | POA: Diagnosis not present

## 2021-11-26 DIAGNOSIS — N185 Chronic kidney disease, stage 5: Secondary | ICD-10-CM | POA: Diagnosis not present

## 2021-11-26 DIAGNOSIS — E1122 Type 2 diabetes mellitus with diabetic chronic kidney disease: Secondary | ICD-10-CM | POA: Diagnosis not present

## 2021-12-08 ENCOUNTER — Ambulatory Visit: Payer: Medicare Other | Admitting: Gastroenterology

## 2021-12-14 DIAGNOSIS — E1122 Type 2 diabetes mellitus with diabetic chronic kidney disease: Secondary | ICD-10-CM | POA: Diagnosis not present

## 2021-12-20 DIAGNOSIS — D631 Anemia in chronic kidney disease: Secondary | ICD-10-CM | POA: Diagnosis not present

## 2021-12-20 DIAGNOSIS — N184 Chronic kidney disease, stage 4 (severe): Secondary | ICD-10-CM | POA: Diagnosis not present

## 2021-12-28 DIAGNOSIS — D485 Neoplasm of uncertain behavior of skin: Secondary | ICD-10-CM | POA: Diagnosis not present

## 2021-12-30 DIAGNOSIS — N185 Chronic kidney disease, stage 5: Secondary | ICD-10-CM | POA: Diagnosis not present

## 2022-01-03 DIAGNOSIS — S81812A Laceration without foreign body, left lower leg, initial encounter: Secondary | ICD-10-CM | POA: Diagnosis not present

## 2022-01-04 DIAGNOSIS — D631 Anemia in chronic kidney disease: Secondary | ICD-10-CM | POA: Diagnosis not present

## 2022-01-04 DIAGNOSIS — I120 Hypertensive chronic kidney disease with stage 5 chronic kidney disease or end stage renal disease: Secondary | ICD-10-CM | POA: Diagnosis not present

## 2022-01-04 DIAGNOSIS — N185 Chronic kidney disease, stage 5: Secondary | ICD-10-CM | POA: Diagnosis not present

## 2022-01-04 DIAGNOSIS — E1122 Type 2 diabetes mellitus with diabetic chronic kidney disease: Secondary | ICD-10-CM | POA: Diagnosis not present

## 2022-01-14 DIAGNOSIS — E1122 Type 2 diabetes mellitus with diabetic chronic kidney disease: Secondary | ICD-10-CM | POA: Diagnosis not present

## 2022-01-19 ENCOUNTER — Encounter: Payer: Self-pay | Admitting: Gastroenterology

## 2022-01-19 ENCOUNTER — Ambulatory Visit: Payer: Medicare Other | Admitting: Gastroenterology

## 2022-01-19 VITALS — BP 118/78 | HR 60 | Ht 70.0 in | Wt 195.6 lb

## 2022-01-19 DIAGNOSIS — N185 Chronic kidney disease, stage 5: Secondary | ICD-10-CM | POA: Diagnosis not present

## 2022-01-19 DIAGNOSIS — K222 Esophageal obstruction: Secondary | ICD-10-CM | POA: Diagnosis not present

## 2022-01-19 DIAGNOSIS — Z794 Long term (current) use of insulin: Secondary | ICD-10-CM

## 2022-01-19 DIAGNOSIS — R1310 Dysphagia, unspecified: Secondary | ICD-10-CM

## 2022-01-19 DIAGNOSIS — E113311 Type 2 diabetes mellitus with moderate nonproliferative diabetic retinopathy with macular edema, right eye: Secondary | ICD-10-CM

## 2022-01-19 DIAGNOSIS — K2210 Ulcer of esophagus without bleeding: Secondary | ICD-10-CM

## 2022-01-19 NOTE — Progress Notes (Signed)
 Chief Complaint:    Dysphagia  GI History: 65-year-old male with a history of CKD 5 (follows with Dr. Sanford), anemia of chronic disease on ESA therapy, prostate CA, CAD w/ hx of STEMI with cardiac arrest 2012 requiring DES, diabetes (A1c 9.8%), HLD, secondary hyperparathyroidism  -01/02/2018: Initial appointment in GI clinic for evaluation of dysphagia, GERD, and CRC screening.  Reports longstanding history of GERD, previously treated with PPI but that was stopped this CKD with subsequent worsening reflux symptoms.  Index symptoms of heartburn, regurgitation, nocturnal reflux. - 02/13/2018: EGD: Moderate stenosis at 39 cm dilated with 15 mm TTS balloon, LA Grade B esophagitis, Hill grade 3 valve, mild gastritis, duodenitis.  Recommended restarting PPI, start sucralfate - 02/13/2018: Colonoscopy: 2 mm rectal hyperplastic polyp, otherwise normal.  Normal TI.  Repeat 10 years  HPI:     Patient is a 65 y.o. male presenting to the Gastroenterology Clinic for evaluation of solid food dysphagia.   Reports having resolution of dysphagia with EGD with dilation in 2019. Sxs started to recur in the last 6 months. Worse with spicy foods.   Otherwise, no reflux sxs. Not taking any PPI or H2RA due to CKD. No melena, hematochezia, abdominal pain. Still maintains active lifestyle, coaching, working full time.   Reviewed most recent labs from 07/07/2021: - H/H 12.2/36.1 with MCV/RDW 100/13.6.  PLT 74 - BUN/creatinine 43/3.88 - AST/ALT 56/52, ALP 246, T. bili 0.7  Follows with Dr. Sanford, eGFR 15 and potentially getting set for dialysis in the future.   Review of systems:     No chest pain, no SOB, no fevers, no urinary sx   Past Medical History:  Diagnosis Date   Acute endophthalmitis    Anemia associated with chronic renal failure    Chronic kidney disease, stage 4 (severe) (HCC)    Coronary disease    Diabetic retinopathy (HCC)    Ganglion and cyst of synovium, tendon, and bursa    Heart attack  (HCC) 10/19/2010   Hyperlipidemia    Hypertension, renal    Ingrowing nail    Iron (Fe) deficiency anemia    Other staphylococcus infection in conditions classified elsewhere and of unspecified site    Prostate cancer (HCC) 2011   Routine general medical examination at a health care facility    RTA (renal tubular acidosis)    Secondary hyperparathyroidism, renal (HCC)    Special screening for malignant neoplasm of prostate    Type II or unspecified type diabetes mellitus without mention of complication, not stated as uncontrolled    Unspecified osteomyelitis, site unspecified    Vitamin D deficiency     Patient's surgical history, family medical history, social history, medications and allergies were all reviewed in Epic    Current Outpatient Medications  Medication Sig Dispense Refill   acetaminophen (TYLENOL) 500 MG tablet Take 1,000 mg by mouth every 8 (eight) hours as needed for moderate pain.     aspirin EC 81 MG tablet Take 81 mg by mouth daily.     atorvastatin (LIPITOR) 80 MG tablet Take 80 mg by mouth daily.     carvedilol (COREG) 3.125 MG tablet Take 3.125 mg by mouth every 12 (twelve) hours.       ezetimibe (ZETIA) 10 MG tablet Take 10 mg by mouth daily.     glucose blood (FREESTYLE TEST STRIPS) test strip 1 each by Other route 2 (two) times daily as needed for other. PRN 102 each 5   insulin glargine (LANTUS) 100 UNIT/ML   Solostar Pen Inject 45 Units into the skin at bedtime.     pioglitazone (ACTOS) 30 MG tablet Take 1 tablet by mouth daily. *APPOINTMENT NEEDED FOR FURTHER REFILLS* 30 tablet 0   pioglitazone (ACTOS) 45 MG tablet Take 45 mg by mouth daily.     sodium bicarbonate 650 MG tablet Take 1,300 mg by mouth 2 (two) times daily.     sucralfate (CARAFATE) 1 GM/10ML suspension Take 10 mLs (1 g total) by mouth 4 (four) times daily. 420 mL 1   timolol (TIMOPTIC) 0.5 % ophthalmic solution Place 1 drop into the left eye 2 (two) times daily. 10 mL 2   nitroGLYCERIN  (NITROSTAT) 0.4 MG SL tablet Place 0.4 mg under the tongue every 5 (five) minutes as needed for chest pain.  (Patient not taking: Reported on 01/19/2022)     ondansetron (ZOFRAN ODT) 4 MG disintegrating tablet Take 1 tablet (4 mg total) by mouth every 8 (eight) hours as needed. (Patient not taking: Reported on 01/19/2022) 20 tablet 0   oxyCODONE (ROXICODONE) 5 MG immediate release tablet Take 1 tablet (5 mg total) by mouth every 4 (four) hours as needed for severe pain. (Patient not taking: Reported on 01/19/2022) 25 tablet 0   No current facility-administered medications for this visit.    Physical Exam:     BP 118/78   Pulse 60   Ht 5' 10" (1.778 m)   Wt 195 lb 9.6 oz (88.7 kg)   SpO2 97%   BMI 28.07 kg/m   GENERAL:  Pleasant male in NAD PSYCH: : Cooperative, normal affect EENT:  conjunctiva pink, mucous membranes moist, neck supple without masses CARDIAC:  RRR, no murmur heard, no peripheral edema PULM: Normal respiratory effort, lungs CTA bilaterally, no wheezing ABDOMEN:  Nondistended, soft, nontender. No obvious masses, no hepatomegaly,  normal bowel sounds SKIN:  turgor, no lesions seen Musculoskeletal:  Normal muscle tone, normal strength NEURO: Alert and oriented x 3, no focal neurologic deficits   IMPRESSION and PLAN:    1) Dysphagia 2) History of esophageal stricture 3) Erosive esophagitis - EGD with esophageal dilation and/or biopsies as appropriate - Recommended cutting food into small pieces, chewing thoroughly, and drinking plenty fluids with meals - He otherwise denies any reflux symptoms.  However, previous EGD demonstrated erosive esophagitis.  If ongoing esophagitis, would recommend restarting acid suppression therapy for what is otherwise silent reflux - Will discuss with Anesthesia service about getting procedure done at [facility] vs [facility]  4) CAD 5) Diabetes 6) CKD 5 - We will discuss with Anesthesia service about getting procedure done at [facility] vs [facility] to resume ASA 81 mg in perioperative period   The indications, risks, and benefits of EGD were explained to the patient in detail. Risks include but are not limited to bleeding, perforation, adverse reaction to medications, and cardiopulmonary compromise. Sequelae include but are not limited to the possibility of surgery, hospitalization, and mortality. The patient verbalized understanding and wished to proceed. All questions answered, referred to scheduler. Further recommendations pending results of the exam.             Lavena Bullion ,DO, FACG 01/19/2022, 11:46 AM

## 2022-01-19 NOTE — Patient Instructions (Addendum)
If you are age 68 or older, your body mass index should be between 23-30. Your Body mass index is 28.07 kg/m. If this is out of the aforementioned range listed, please consider follow up with your Primary Care Provider.  If you are age 61 or younger, your body mass index should be between 19-25. Your Body mass index is 28.07 kg/m. If this is out of the aformentioned range listed, please consider follow up with your Primary Care Provider.   __________________________________________________________  The [place] [facility] providers would like to encourage you to use [facility] to communicate with providers for non-urgent requests or questions.  Due to long hold times on the telephone, sending your provider a message by [facility] may be a faster and more efficient way to get a response.  Please allow 48 business hours for a response.  Please remember that this is for non-urgent requests.   Due to recent changes in healthcare laws, you may see the results of your imaging and laboratory studies on MyChart before your provider has had a chance to review them.  We understand that in some cases there may be results that are confusing or concerning to you. Not all laboratory results come back in the same time frame and the provider may be waiting for multiple results in order to interpret others.  Please give [place] 48 hours in order for your provider to thoroughly review all the results before contacting the office for clarification of your results.   You have been scheduled for an endoscopy and colonoscopy. Please follow the written instructions given to you at your visit today. Please pick up your prep supplies at the pharmacy within the next 1-3 days. If you use inhalers (even only as needed), please bring them with you on the day of your procedure.      Thank you for choosing me and [facility].  Vito Cirigliano, D.O.

## 2022-01-25 ENCOUNTER — Other Ambulatory Visit: Payer: Self-pay | Admitting: *Deleted

## 2022-01-25 DIAGNOSIS — N289 Disorder of kidney and ureter, unspecified: Secondary | ICD-10-CM

## 2022-02-03 ENCOUNTER — Ambulatory Visit (HOSPITAL_COMMUNITY): Payer: Medicare Other

## 2022-02-03 ENCOUNTER — Encounter: Payer: Medicare Other | Admitting: Vascular Surgery

## 2022-02-03 ENCOUNTER — Ambulatory Visit (HOSPITAL_COMMUNITY): Payer: Medicare Other | Attending: Vascular Surgery

## 2022-02-13 DIAGNOSIS — E1122 Type 2 diabetes mellitus with diabetic chronic kidney disease: Secondary | ICD-10-CM | POA: Diagnosis not present

## 2022-02-14 ENCOUNTER — Ambulatory Visit (HOSPITAL_COMMUNITY)
Admission: RE | Admit: 2022-02-14 | Discharge: 2022-02-14 | Disposition: A | Discharge status: Home / Self Care | Payer: Medicare Other | Source: Ambulatory Visit | Attending: Vascular Surgery | Admitting: Vascular Surgery

## 2022-02-14 ENCOUNTER — Ambulatory Visit (INDEPENDENT_AMBULATORY_CARE_PROVIDER_SITE_OTHER)
Admission: RE | Admit: 2022-02-14 | Discharge: 2022-02-14 | Disposition: A | Discharge status: Home / Self Care | Payer: Medicare Other | Source: Ambulatory Visit | Attending: Vascular Surgery | Admitting: Vascular Surgery

## 2022-02-14 DIAGNOSIS — N289 Disorder of kidney and ureter, unspecified: Secondary | ICD-10-CM

## 2022-02-17 DIAGNOSIS — S81812D Laceration without foreign body, left lower leg, subsequent encounter: Secondary | ICD-10-CM | POA: Diagnosis not present

## 2022-02-17 DIAGNOSIS — Z4802 Encounter for removal of sutures: Secondary | ICD-10-CM | POA: Diagnosis not present

## 2022-02-23 ENCOUNTER — Encounter: Payer: Self-pay | Admitting: Vascular Surgery

## 2022-02-23 ENCOUNTER — Ambulatory Visit: Payer: Medicare Other | Admitting: Vascular Surgery

## 2022-02-23 VITALS — BP 157/83 | HR 60 | Temp 97.9°F | Resp 20 | Ht 70.0 in | Wt 193.0 lb

## 2022-02-23 DIAGNOSIS — N185 Chronic kidney disease, stage 5: Secondary | ICD-10-CM | POA: Diagnosis not present

## 2022-02-23 NOTE — Progress Notes (Signed)
Patient ID: Jason Zimmerman, male   DOB: 05-15-1955, 66 y.o.   MRN: [id number]  Reason for Consult: New Patient (Initial Visit)   Referred by Rexene Agent, MD  Subjective:     HPI:  Jason Zimmerman is a 66 y.o. male history of coronary artery disease coded several years ago underwent coronary artery stent placement.  No previous history of vascular disease.  Does have hyperlipidemia hypertension.  Currently does take an aspirin and a statin.  He has never been on dialysis before.  He is right-hand dominant.  Continues to coach travel softball teams.  Past Medical History:  Diagnosis Date   Acute endophthalmitis    Anemia associated with chronic renal failure    Chronic kidney disease, stage 4 (severe) (HCC)    Coronary disease    Diabetic retinopathy (HCC)    Ganglion and cyst of synovium, tendon, and bursa    Heart attack ([place]) 10/19/2010   Hyperlipidemia    Hypertension, renal    Ingrowing nail    Iron (Fe) deficiency anemia    Other staphylococcus infection in conditions classified elsewhere and of unspecified site    Prostate cancer [facility]) 2011   Routine general medical examination at a health care facility    RTA (renal tubular acidosis)    Secondary hyperparathyroidism, renal ([place])    Special screening for malignant neoplasm of prostate    Type II or unspecified type diabetes mellitus without mention of complication, not stated as uncontrolled    Unspecified osteomyelitis, site unspecified    Vitamin D deficiency    Family History  Problem Relation Age of Onset   Cancer Father        Prostate   Heart disease Father    Cirrhosis Maternal Grandmother    Colon cancer Neg Hx    Esophageal cancer Neg Hx    Past Surgical History:  Procedure Laterality Date   APPENDECTOMY     BACK SURGERY     BIOPSY  02/13/2018   Procedure: BIOPSY;  Surgeon: Lavena Bullion, DO;  Location: [facility];  Service: Gastroenterology;;   COLONOSCOPY WITH PROPOFOL N/A 02/13/2018    Procedure: COLONOSCOPY WITH PROPOFOL;  Surgeon: Lavena Bullion, DO;  Location: [facility];  Service: Gastroenterology;  Laterality: N/A;   CORONARY ANGIOPLASTY     by notes, LAD stent placed 10/2010 in setting of MI   ESOPHAGEAL DILATION  02/13/2018   Procedure: ESOPHAGEAL DILATION;  Surgeon: Lavena Bullion, DO;  Location: [facility];  Service: Gastroenterology;;   ESOPHAGOGASTRODUODENOSCOPY (EGD) WITH PROPOFOL N/A 02/13/2018   Procedure: ESOPHAGOGASTRODUODENOSCOPY (EGD) WITH PROPOFOL;  Surgeon: Lavena Bullion, DO;  [facility]: [facility];  Service: Gastroenterology;  Laterality: N/A;   NASAL SEPTUM SURGERY  1981   PROSTATE SURGERY     QUADRICEPS TENDON REPAIR Right 02/16/2021   Procedure: RIGHT KNEE OPEN REPAIR QUADRICEP TENDON;  Surgeon: Nicholes Stairs, MD;  Location: [place];  Service: Orthopedics;  Laterality: Right;   VITRECTOMY  12/21/2006    Short Social History:  Social History   Tobacco Use   Smoking status: Never   Smokeless tobacco: Never  Substance Use Topics   Alcohol use: Not Currently    No Known Allergies  Current Outpatient Medications  Medication Sig Dispense Refill   acetaminophen (TYLENOL) 500 MG tablet Take 1,000 mg by mouth Zimmerman 8 (eight) hours as needed for moderate pain.     aspirin EC 81 MG tablet Take 81 mg by mouth daily.  atorvastatin (LIPITOR) 80 MG tablet Take 80 mg by mouth daily.     carvedilol (COREG) 3.125 MG tablet Take 3.125 mg by mouth Zimmerman 12 (twelve) hours.       ezetimibe (ZETIA) 10 MG tablet Take 10 mg by mouth daily.     glucose blood (FREESTYLE TEST STRIPS) test strip 1 each by Other route 2 (two) times daily as needed for other. PRN 102 each 5   insulin glargine (LANTUS) 100 UNIT/ML Solostar Pen Inject 45 Units into the skin at bedtime.     pioglitazone (ACTOS) 30 MG tablet Take 1 tablet by mouth daily. *APPOINTMENT NEEDED FOR FURTHER REFILLS* 30 tablet 0   pioglitazone (ACTOS) 45 MG tablet Take 45 mg by mouth  daily.     sodium bicarbonate 650 MG tablet Take 1,300 mg by mouth 2 (two) times daily.     timolol (TIMOPTIC) 0.5 % ophthalmic solution Place 1 drop into the left eye 2 (two) times daily. 10 mL 2   nitroGLYCERIN (NITROSTAT) 0.4 MG SL tablet Place 0.4 mg under the tongue Zimmerman 5 (five) minutes as needed for chest pain.  (Patient not taking: Reported on 01/19/2022)     ondansetron (ZOFRAN ODT) 4 MG disintegrating tablet Take 1 tablet (4 mg total) by mouth Zimmerman 8 (eight) hours as needed. (Patient not taking: Reported on 01/19/2022) 20 tablet 0   sucralfate (CARAFATE) 1 GM/10ML suspension Take 10 mLs (1 g total) by mouth 4 (four) times daily. 420 mL 1   No current facility-administered medications for this visit.    Review of Systems  Constitutional:  Constitutional negative. HENT: HENT negative.  Eyes: Eyes negative.  Respiratory: Respiratory negative.  Cardiovascular: Cardiovascular negative.  GI: Gastrointestinal negative.  Musculoskeletal: Musculoskeletal negative.  Skin: Skin negative.  Neurological: Neurological negative. Hematologic: Hematologic/lymphatic negative.  Psychiatric: Psychiatric negative.        Objective:  Objective   Vitals:   02/23/22 1102  BP: (!) 157/83  Pulse: 60  Resp: 20  Temp: 97.9 F (36.6 C)  SpO2: (!) 59%  Weight: 193 lb (87.5 kg)  Height: '5\' 10"'$  (1.778 m)   Body mass index is 27.69 kg/m.  Physical Exam HENT:     Head: Normocephalic.     Nose: Nose normal.  Eyes:     Pupils: Pupils are equal, round, and reactive to light.  Cardiovascular:     Pulses:          Radial pulses are 2+ on the right side and 2+ on the left side.  Pulmonary:     Effort: Pulmonary effort is normal.  Abdominal:     General: Abdomen is flat.     Palpations: Abdomen is soft.  Musculoskeletal:     Right lower leg: No edema.     Left lower leg: No edema.  Skin:    General: Skin is warm and dry.     Capillary Refill: Capillary refill takes less than 2 seconds.   Neurological:     General: No focal deficit present.     Mental Status: He is alert.     Data: Right Cephalic   Diameter (cm)Depth (cm)Findings  +-----------------+-------------+----------+--------+  Shoulder             0.26                         +-----------------+-------------+----------+--------+  Prox upper arm       0.28                         +-----------------+-------------+----------+--------+  Mid upper arm        0.28                         +-----------------+-------------+----------+--------+  Dist upper arm       0.29                         +-----------------+-------------+----------+--------+  Antecubital fossa    0.31                         +-----------------+-------------+----------+--------+  Prox forearm         0.26                         +-----------------+-------------+----------+--------+  Mid forearm          0.25                         +-----------------+-------------+----------+--------+  Dist forearm         0.21                         +-----------------+-------------+----------+--------+   +-----------------+-------------+----------+--------+  Right Basilic    Diameter (cm)Depth (cm)Findings  +-----------------+-------------+----------+--------+  Prox upper arm       0.32                         +-----------------+-------------+----------+--------+  Mid upper arm        0.33                         +-----------------+-------------+----------+--------+  Dist upper arm       0.34                         +-----------------+-------------+----------+--------+  Antecubital fossa    0.32                         +-----------------+-------------+----------+--------+   +-----------------+-------------+----------+--------+  Left Cephalic    Diameter (cm)Depth (cm)Findings  +-----------------+-------------+----------+--------+  Shoulder             0.28                          +-----------------+-------------+----------+--------+  Prox upper arm       0.20                         +-----------------+-------------+----------+--------+  Mid upper arm        0.25                         +-----------------+-------------+----------+--------+  Dist upper arm       0.23                         +-----------------+-------------+----------+--------+  Antecubital fossa    0.24                         +-----------------+-------------+----------+--------+  Prox forearm         0.18                         +-----------------+-------------+----------+--------+  Mid forearm          0.13                         +-----------------+-------------+----------+--------+  Dist forearm         0.12                         +-----------------+-------------+----------+--------+   +-----------------+-------------+----------+--------+  Left Basilic     Diameter (cm)Depth (cm)Findings  +-----------------+-------------+----------+--------+  Prox upper arm       0.52                         +-----------------+-------------+----------+--------+  Mid upper arm        0.44                         +-----------------+-------------+----------+--------+  Dist upper arm       0.44                         +-----------------+-------------+----------+--------+  Antecubital fossa    0.43                         +-----------------+-------------+----------+--------+   Summary: Right: Patent and compressible cephalic and basilic veins.  Left: Patent and compressible cephalic and basilic veins.   Right Pre-Dialysis Findings:  +-----------------------+----------+--------------------+---------+--------  +  Location               PSV (cm/s)Intralum. Diam. (cm)Waveform  Comments  +-----------------------+----------+--------------------+---------+--------  +  Brachial Antecub. fossa81        0.58                triphasic             +-----------------------+----------+--------------------+---------+--------  +  Radial Art at Wrist    71        0.24                triphasic            +-----------------------+----------+--------------------+---------+--------  +  Ulnar Art at Wrist     56        0.25                triphasic            +-----------------------+----------+--------------------+---------+--------  +     Left Pre-Dialysis Findings:  +-----------------------+----------+--------------------+---------+--------  +  Location               PSV (cm/s)Intralum. Diam. (cm)Waveform  Comments  +-----------------------+----------+--------------------+---------+--------  +  Brachial Antecub. fossa90        0.45                triphasic            +-----------------------+----------+--------------------+---------+--------  +  Radial Art at Wrist    79        0.25                triphasic            +-----------------------+----------+--------------------+---------+--------  +  Ulnar Art at Wrist     64        0.24                triphasic            +-----------------------+----------+--------------------+---------+--------  +  Summary:     Right: No obstruction visualized in the right upper extremity.  Left: No obstruction visualized in the left upper extremity.       Assessment/Plan:    66 year old male with CKD 5 now sent for dialysis access evaluation.  He is right-hand dominant without any previous chest breasts or upper extremity surgeries.  Plan will be for left arm AV fistula versus graft in the future.  I discussed the risk benefits and alternatives as well as the likely need for multiple procedures particularly with the left basilic vein fistula creation.  We also discussed the risk of primary and on failure and steal and need for catheter if dialysis is needed prior to fistula maturation.  Patient and his wife demonstrate good  understanding.     Waynetta Sandy MD [facility]

## 2022-03-08 ENCOUNTER — Encounter: Payer: Self-pay | Admitting: Certified Registered Nurse Anesthetist

## 2022-03-10 DIAGNOSIS — I700 Atherosclerosis of aorta: Secondary | ICD-10-CM | POA: Diagnosis not present

## 2022-03-10 DIAGNOSIS — E1122 Type 2 diabetes mellitus with diabetic chronic kidney disease: Secondary | ICD-10-CM | POA: Diagnosis not present

## 2022-03-10 DIAGNOSIS — N185 Chronic kidney disease, stage 5: Secondary | ICD-10-CM | POA: Diagnosis not present

## 2022-03-10 DIAGNOSIS — I120 Hypertensive chronic kidney disease with stage 5 chronic kidney disease or end stage renal disease: Secondary | ICD-10-CM | POA: Diagnosis not present

## 2022-03-11 ENCOUNTER — Other Ambulatory Visit: Payer: Self-pay

## 2022-03-11 DIAGNOSIS — N185 Chronic kidney disease, stage 5: Secondary | ICD-10-CM | POA: Diagnosis not present

## 2022-03-11 DIAGNOSIS — I120 Hypertensive chronic kidney disease with stage 5 chronic kidney disease or end stage renal disease: Secondary | ICD-10-CM | POA: Diagnosis not present

## 2022-03-11 DIAGNOSIS — N189 Chronic kidney disease, unspecified: Secondary | ICD-10-CM | POA: Diagnosis not present

## 2022-03-11 DIAGNOSIS — E1122 Type 2 diabetes mellitus with diabetic chronic kidney disease: Secondary | ICD-10-CM | POA: Diagnosis not present

## 2022-03-11 DIAGNOSIS — D631 Anemia in chronic kidney disease: Secondary | ICD-10-CM | POA: Diagnosis not present

## 2022-03-14 ENCOUNTER — Other Ambulatory Visit (INDEPENDENT_AMBULATORY_CARE_PROVIDER_SITE_OTHER): Payer: Medicare Other

## 2022-03-14 ENCOUNTER — Ambulatory Visit (AMBULATORY_SURGERY_CENTER): Payer: Medicare Other | Admitting: Gastroenterology

## 2022-03-14 ENCOUNTER — Other Ambulatory Visit: Payer: Self-pay

## 2022-03-14 ENCOUNTER — Encounter (INDEPENDENT_AMBULATORY_CARE_PROVIDER_SITE_OTHER): Payer: Medicare HMO | Admitting: Ophthalmology

## 2022-03-14 ENCOUNTER — Encounter: Payer: Self-pay | Admitting: Gastroenterology

## 2022-03-14 VITALS — BP 98/56 | HR 84 | Temp 97.8°F | Resp 14 | Ht 70.0 in | Wt 195.0 lb

## 2022-03-14 DIAGNOSIS — K3189 Other diseases of stomach and duodenum: Secondary | ICD-10-CM | POA: Diagnosis not present

## 2022-03-14 DIAGNOSIS — R1310 Dysphagia, unspecified: Secondary | ICD-10-CM

## 2022-03-14 DIAGNOSIS — K2970 Gastritis, unspecified, without bleeding: Secondary | ICD-10-CM | POA: Diagnosis not present

## 2022-03-14 DIAGNOSIS — I8510 Secondary esophageal varices without bleeding: Secondary | ICD-10-CM

## 2022-03-14 DIAGNOSIS — K222 Esophageal obstruction: Secondary | ICD-10-CM

## 2022-03-14 DIAGNOSIS — K219 Gastro-esophageal reflux disease without esophagitis: Secondary | ICD-10-CM

## 2022-03-14 DIAGNOSIS — K2990 Gastroduodenitis, unspecified, without bleeding: Secondary | ICD-10-CM

## 2022-03-14 DIAGNOSIS — I8500 Esophageal varices without bleeding: Secondary | ICD-10-CM | POA: Diagnosis not present

## 2022-03-14 HISTORY — PX: UPPER GI ENDOSCOPY: SHX6162

## 2022-03-14 HISTORY — DX: Gastro-esophageal reflux disease without esophagitis: K21.9

## 2022-03-14 LAB — CBC
HCT: 23.9 % — CL (ref 39.0–52.0)
Hemoglobin: 7.8 g/dL — CL (ref 13.0–17.0)
MCHC: 32.7 g/dL (ref 30.0–36.0)
MCV: 100.9 fl — ABNORMAL HIGH (ref 78.0–100.0)
Platelets: 74 10*3/uL — ABNORMAL LOW (ref 150.0–400.0)
RBC: 2.37 Mil/uL — ABNORMAL LOW (ref 4.22–5.81)
RDW: 14.5 % (ref 11.5–15.5)
WBC: 2.7 10*3/uL — ABNORMAL LOW (ref 4.0–10.5)

## 2022-03-14 LAB — PROTIME-INR
INR: 1.1 ratio — ABNORMAL HIGH (ref 0.8–1.0)
Prothrombin Time: 11.7 s (ref 9.6–13.1)

## 2022-03-14 LAB — COMPREHENSIVE METABOLIC PANEL
ALT: 21 U/L (ref 0–53)
AST: 20 U/L (ref 0–37)
Albumin: 3.2 g/dL — ABNORMAL LOW (ref 3.5–5.2)
Alkaline Phosphatase: 137 U/L — ABNORMAL HIGH (ref 39–117)
BUN: 45 mg/dL — ABNORMAL HIGH (ref 6–23)
CO2: 23 mEq/L (ref 19–32)
Calcium: 8.3 mg/dL — ABNORMAL LOW (ref 8.4–10.5)
Chloride: 108 mEq/L (ref 96–112)
Creatinine, Ser: 4.6 mg/dL (ref 0.40–1.50)
GFR: 12.43 mL/min — CL (ref >60.00)
Glucose, Bld: 257 mg/dL — ABNORMAL HIGH (ref 70–99)
Potassium: 4.8 mEq/L (ref 3.5–5.1)
Sodium: 137 mEq/L (ref 135–145)
Total Bilirubin: 0.5 mg/dL (ref 0.2–1.2)
Total Protein: 6.2 g/dL (ref 6.0–8.3)

## 2022-03-14 LAB — IBC + FERRITIN
Ferritin: 10.5 ng/mL — ABNORMAL LOW (ref 22.0–322.0)
Iron: 43 ug/dL (ref 42–165)
Saturation Ratios: 11.5 % — ABNORMAL LOW (ref 20.0–50.0)
TIBC: 373.8 ug/dL (ref 250.0–450.0)
Transferrin: 267 mg/dL (ref 212.0–360.0)

## 2022-03-14 MED ORDER — SODIUM CHLORIDE 0.9 % IV SOLN: 500.0000 mL | Freq: Once | INTRAVENOUS | Status: DC

## 2022-03-14 NOTE — Progress Notes (Signed)
1326 Robinul 0.1 mg IV given due large amount of secretions upon assessment.  MD made aware, vss

## 2022-03-14 NOTE — Progress Notes (Signed)
GASTROENTEROLOGY PROCEDURE H&P NOTE   Primary Care Physician: Townsend Roger, MD    Reason for Procedure:  Dysphagia, esophageal stenosis, GERD with erosive esophagitis  Plan:    EGD with dilation and/or biopsies  Patient is appropriate for endoscopic procedure(s) in the ambulatory ([place]) setting.  The nature of the procedure, as well as the risks, benefits, and alternatives were carefully and thoroughly reviewed with the patient. Ample time for discussion and questions allowed. The patient understood, was satisfied, and agreed to proceed.     HPI: Jason Zimmerman is a 66 y.o. male who presents for EGD for evaluation of dysphagia and prior history of esophageal stenosis.  Past Medical History:  Diagnosis Date   Acute endophthalmitis    Anemia associated with chronic renal failure    Chronic kidney disease, stage 4 (severe) (HCC)    Coronary disease    Diabetic retinopathy ([place])    Ganglion and cyst of synovium, tendon, and bursa    Heart attack ([place]) 10/19/2010   Hyperlipidemia    Hypertension, renal    Ingrowing nail    Iron (Fe) deficiency anemia    Other staphylococcus infection in conditions classified elsewhere and of unspecified site    Prostate cancer [facility]) 2011   Routine general medical examination at a health care facility    RTA (renal tubular acidosis)    Secondary hyperparathyroidism, renal ([place])    Special screening for malignant neoplasm of prostate    Type II or unspecified type diabetes mellitus without mention of complication, not stated as uncontrolled    Unspecified osteomyelitis, site unspecified    Vitamin D deficiency     Past Surgical History:  Procedure Laterality Date   APPENDECTOMY     BACK SURGERY     BIOPSY  02/13/2018   Procedure: BIOPSY;  Surgeon: Lavena Bullion, DO;  [facility];  Service: Gastroenterology;;   COLONOSCOPY WITH PROPOFOL N/A 02/13/2018   Procedure: COLONOSCOPY WITH PROPOFOL;  Surgeon: Lavena Bullion, DO;  [facility];  Service: Gastroenterology;  Laterality: N/A;   CORONARY ANGIOPLASTY     by notes, LAD stent placed 10/2010 in setting of MI   ESOPHAGEAL DILATION  02/13/2018   Procedure: ESOPHAGEAL DILATION;  Surgeon: Lavena Bullion, DO;  [facility];  Service: Gastroenterology;;   ESOPHAGOGASTRODUODENOSCOPY (EGD) WITH PROPOFOL N/A 02/13/2018   Procedure: ESOPHAGOGASTRODUODENOSCOPY (EGD) WITH PROPOFOL;  Surgeon: Lavena Bullion, DO;  [facility];  Service: Gastroenterology;  Laterality: N/A;   NASAL SEPTUM SURGERY  1981   PROSTATE SURGERY     QUADRICEPS TENDON REPAIR Right 02/16/2021   Procedure: RIGHT KNEE OPEN REPAIR QUADRICEP TENDON;  Surgeon: Nicholes Stairs, MD;  Location: [place];  Service: Orthopedics;  Laterality: Right;   VITRECTOMY  12/21/2006    Prior to Admission medications   Medication Sig Start Date End Date Taking? Authorizing Provider  aspirin EC 81 MG tablet Take 81 mg by mouth daily.   Yes [provider]  atorvastatin (LIPITOR) 80 MG tablet Take 80 mg by mouth daily.   Yes [provider]  carvedilol (COREG) 3.125 MG tablet Take 3.125 mg by mouth every 12 (twelve) hours.     Yes [provider]  ezetimibe (ZETIA) 10 MG tablet Take 10 mg by mouth daily.   Yes [provider]  glucose blood (FREESTYLE TEST STRIPS) test strip 1 each by Other route 2 (two) times daily as needed for other. PRN 11/11/10  Yes Renato Shin, MD  insulin glargine (LANTUS) 100 UNIT/ML Solostar Pen Inject 45 Units into the skin at bedtime. 12/24/15  Yes [provider]  pioglitazone (ACTOS) 30 MG tablet Take 1 tablet by mouth daily. *APPOINTMENT NEEDED FOR FURTHER REFILLS* 04/22/14  Yes Renato Shin, MD  pioglitazone (ACTOS) 45 MG tablet Take 45 mg by mouth daily.   Yes [provider]  sodium bicarbonate 650 MG tablet Take 1,300 mg by mouth 2 (two) times daily.   Yes [provider]   timolol (TIMOPTIC) 0.5 % ophthalmic solution Place 1 drop into the left eye 2 (two) times daily. 10/25/21  Yes Rankin, Clent Demark, MD  acetaminophen (TYLENOL) 500 MG tablet Take 1,000 mg by mouth every 8 (eight) hours as needed for moderate pain.    [provider]  nitroGLYCERIN (NITROSTAT) 0.4 MG SL tablet Place 0.4 mg under the tongue every 5 (five) minutes as needed for chest pain.  Patient not taking: Reported on 01/19/2022    [provider]  ondansetron (ZOFRAN ODT) 4 MG disintegrating tablet Take 1 tablet (4 mg total) by mouth every 8 (eight) hours as needed. Patient not taking: Reported on 01/19/2022 02/16/21   Nicholes Stairs, MD  sucralfate (CARAFATE) 1 GM/10ML suspension Take 10 mLs (1 g total) by mouth 4 (four) times daily. 02/13/18 01/19/22  Enmanuel Zufall, Dominic Pea, DO    Current Outpatient Medications  Medication Sig Dispense Refill   aspirin EC 81 MG tablet Take 81 mg by mouth daily.     atorvastatin (LIPITOR) 80 MG tablet Take 80 mg by mouth daily.     carvedilol (COREG) 3.125 MG tablet Take 3.125 mg by mouth every 12 (twelve) hours.       ezetimibe (ZETIA) 10 MG tablet Take 10 mg by mouth daily.     glucose blood (FREESTYLE TEST STRIPS) test strip 1 each by Other route 2 (two) times daily as needed for other. PRN 102 each 5   insulin glargine (LANTUS) 100 UNIT/ML Solostar Pen Inject 45 Units into the skin at bedtime.     pioglitazone (ACTOS) 30 MG tablet Take 1 tablet by mouth daily. *APPOINTMENT NEEDED FOR FURTHER REFILLS* 30 tablet 0   pioglitazone (ACTOS) 45 MG tablet Take 45 mg by mouth daily.     sodium bicarbonate 650 MG tablet Take 1,300 mg by mouth 2 (two) times daily.     timolol (TIMOPTIC) 0.5 % ophthalmic solution Place 1 drop into the left eye 2 (two) times daily. 10 mL 2   acetaminophen (TYLENOL) 500 MG tablet Take 1,000 mg by mouth every 8 (eight) hours as needed for moderate pain.     nitroGLYCERIN (NITROSTAT) 0.4 MG SL tablet Place 0.4 mg under the  tongue every 5 (five) minutes as needed for chest pain.  (Patient not taking: Reported on 01/19/2022)     ondansetron (ZOFRAN ODT) 4 MG disintegrating tablet Take 1 tablet (4 mg total) by mouth every 8 (eight) hours as needed. (Patient not taking: Reported on 01/19/2022) 20 tablet 0   sucralfate (CARAFATE) 1 GM/10ML suspension Take 10 mLs (1 g total) by mouth 4 (four) times daily. 420 mL 1   Current Facility-Administered Medications  Medication Dose Route Frequency Provider Last Rate Last Admin   0.9 %  sodium chloride infusion  500 mL Intravenous Once Cheris Tweten V, DO        Allergies as of 03/14/2022   (No Known Allergies)    Family History  Problem Relation Age of Onset   Cancer Father  Prostate   Heart disease Father    Cirrhosis Maternal Grandmother    Colon cancer Neg Hx    Esophageal cancer Neg Hx     Social History   Socioeconomic History   Marital status: Married    Spouse name: Not on file   Number of children: 0   Years of education: Not on file   Highest education level: Not on file  Occupational History   Not on file  Tobacco Use   Smoking status: Never   Smokeless tobacco: Never  Vaping Use   Vaping Use: Never used  Substance and Sexual Activity   Alcohol use: Not Currently   Drug use: Never   Sexual activity: Not on file  Other Topics Concern   Not on file  Social History Narrative   Not on file   Social Determinants of Health   Financial Resource Strain: Not on file  Food Insecurity: Not on file  Transportation Needs: Not on file  Physical Activity: Not on file  Stress: Not on file  Social Connections: Not on file  Intimate Partner Violence: Not on file    Physical Exam: Vital signs in last 24 hours: '@BP'$  (!) 124/56   Pulse (!) 58   Temp 97.8 F (36.6 C) (Temporal)   Ht '5\' 10"'$  (1.778 m)   Wt 195 lb (88.5 kg)   SpO2 100%   BMI 27.98 kg/m  GEN: NAD EYE: Sclerae anicteric ENT: MMM CV: Non-tachycardic Pulm: CTA b/l GI:  Soft, NT/ND NEURO:  Alert & Oriented x [place], DO [facility]   03/14/2022 1:22 PM

## 2022-03-14 NOTE — Op Note (Signed)
[facility] Patient Name: Jason Zimmerman Procedure Date: 03/14/2022 1:26 PM MRN: [id number] Endoscopist: Gerrit Heck , MD, [id number] Age: 66 Referring MD:  Date of Birth: 09-23-1955 Gender: Male Account #: [id number] Procedure:                Upper GI endoscopy Indications:              Dysphagia, Esophageal reflux                           EGD in 2019 with moderate stenosis at 39 cm dilated                            with 15 mm TTS balloon with good clinical response.                            He has since had return of intermittent solid food                            dysphagia. EGD at that time also with LA Grade B                            esophagitis, Hill grade 3 valve. Reflux currently                            well controlled. Medicines:                Monitored Anesthesia Care Procedure:                Pre-Anesthesia Assessment:                           - Prior to the procedure, a History and Physical                            was performed, and patient medications and                            allergies were reviewed. The patient's tolerance of                            previous anesthesia was also reviewed. The risks                            and benefits of the procedure and the sedation                            options and risks were discussed with the patient.                            All questions were answered, and informed consent                            was obtained. Prior Anticoagulants: The patient has  taken no anticoagulant or antiplatelet agents. ASA                            Grade Assessment: III - A patient with severe                            systemic disease. After reviewing the risks and                            benefits, the patient was deemed in satisfactory                            condition to undergo the procedure.                           After obtaining informed consent, the  endoscope was                            passed under direct vision. Throughout the                            procedure, the patient's blood pressure, pulse, and                            oxygen saturations were monitored continuously. The                            GIF [id number] #[id number] was introduced through the                            mouth, and advanced to the second part of duodenum.                            The upper GI endoscopy was accomplished without                            difficulty. The patient tolerated the procedure                            well. Scope In: Scope Out: Findings:                 Grade I varices were found in the lower third of                            the esophagus. They were small in size and                            flattened with insufflation.                           One benign-appearing, intrinsic mild stenosis was                            found 39 cm from  the incisors. This stenosis                            measured less than one cm (in length). The stenosis                            was easily traversed. Due to the presence of                            esophageal varices at the same location, I elected                            to hold off on esophageal balloon dilation.                           The Z-line was regular and was found 40 cm from the                            incisors.                           Diffuse moderate inflammation characterized by                            congestion (edema) and erythema was found in the                            entire examined stomach. Biopsies were taken with a                            cold forceps for histology. Estimated blood loss                            was minimal.                           Localized moderately erythematous mucosa without                            active bleeding was found in the duodenal bulb.                            Biopsies were taken with a cold  forceps for                            histology. Estimated blood loss was minimal.                           The second portion of the duodenum was normal. Complications:            No immediate complications. Estimated Blood Loss:     Estimated blood loss was minimal. Impression:               - Grade I esophageal varices.                           -  Benign-appearing esophageal stenosis. Esophageal                            dilation was not performed due to concomittant                            varices at the same location.                           - Z-line regular, 40 cm from the incisors.                           - Gastritis. Biopsied.                           - Erythematous duodenopathy. Biopsied.                           - Normal second portion of the duodenum. Recommendation:           - Patient has a contact number available for                            emergencies. The signs and symptoms of potential                            delayed complications were discussed with the                            patient. Return to normal activities tomorrow.                            Written discharge instructions were provided to the                            patient.                           - Resume previous diet.                           - Continue present medications.                           - Await pathology results.                           - Perform a RUQ ultrasound at appointment to be                            scheduled.                           - Check CBC, CMP, PT/INR.                           - Return to GI clinic at appointment to be  scheduled. Gerrit Heck, MD 03/14/2022 1:51:04 PM

## 2022-03-14 NOTE — Patient Instructions (Addendum)
Go to the lab on your way out for a CBC, CMP, and PT/INR.  Someone from the GI clinic will call you to schedule an ultrasound and a clinic follow up.  Handout on Gastritis given.  YOU HAD AN ENDOSCOPIC PROCEDURE TODAY AT [facility]:   Refer to the procedure report that was given to you for any specific questions about what was found during the examination.  If the procedure report does not answer your questions, please call your gastroenterologist to clarify.  If you requested that your care partner not be given the details of your procedure findings, then the procedure report has been included in a sealed envelope for you to review at your convenience later.  YOU SHOULD EXPECT: Some feelings of bloating in the abdomen. Passage of more gas than usual.  Walking can help get rid of the air that was put into your GI tract during the procedure and reduce the bloating. If you had a lower endoscopy (such as a colonoscopy or flexible sigmoidoscopy) you may notice spotting of blood in your stool or on the toilet paper. If you underwent a bowel prep for your procedure, you may not have a normal bowel movement for a few days.  Please Note:  You might notice some irritation and congestion in your nose or some drainage.  This is from the oxygen used during your procedure.  There is no need for concern and it should clear up in a day or so.  SYMPTOMS TO REPORT IMMEDIATELY:   Following upper endoscopy (EGD)  Vomiting of blood or coffee ground material  New chest pain or pain under the shoulder blades  Painful or persistently difficult swallowing  New shortness of breath  Fever of 100F or higher  Black, tarry-looking stools  For urgent or emergent issues, a gastroenterologist can be reached at any hour by calling [phone]. Do not use MyChart messaging for urgent concerns.    DIET:  We do recommend a small meal at first, but then you may proceed to your regular diet.  Drink plenty  of fluids but you should avoid alcoholic beverages for 24 hours.  ACTIVITY:  You should plan to take it easy for the rest of today and you should NOT DRIVE or use heavy machinery until tomorrow (because of the sedation medicines used during the test).    FOLLOW UP: Our staff will call the number listed on your records the next business day following your procedure.  We will call around 7:15- 8:00 am to check on you and address any questions or concerns that you may have regarding the information given to you following your procedure. If we do not reach you, we will leave a message.     If any biopsies were taken you will be contacted by phone or by letter within the next 1-3 weeks.  Please call us at [phone] if you have not heard about the biopsies in 3 weeks.    SIGNATURES/CONFIDENTIALITY: You and/or your care partner have signed paperwork which will be entered into your electronic medical record.  These signatures attest to the fact that that the information above on your After Visit Summary has been reviewed and is understood.  Full responsibility of the confidentiality of this discharge information lies with you and/or your care-partner.

## 2022-03-14 NOTE — Progress Notes (Signed)
VS completed by DT.  Pt's states no medical or surgical changes since previsit or office visit.  

## 2022-03-14 NOTE — Progress Notes (Signed)
Called to room to assist during endoscopic procedure.  Patient ID and intended procedure confirmed with present staff. Received instructions for my participation in the procedure from the performing physician.  

## 2022-03-14 NOTE — Progress Notes (Signed)
Report given to PACU, vss 

## 2022-03-15 ENCOUNTER — Other Ambulatory Visit: Payer: Self-pay

## 2022-03-15 ENCOUNTER — Telehealth: Payer: Self-pay

## 2022-03-15 DIAGNOSIS — D500 Iron deficiency anemia secondary to blood loss (chronic): Secondary | ICD-10-CM

## 2022-03-15 NOTE — Telephone Encounter (Signed)
  Follow up Call-     03/14/2022   12:55 PM  Call back number  Post procedure Call Back phone  # [phone]  Permission to leave phone message Yes     Patient questions:  Do you have a fever, pain , or abdominal swelling? No. Pain Score  0 *  Have you tolerated food without any problems? Yes.    Have you been able to return to your normal activities? Yes.    Do you have any questions about your discharge instructions: Diet   No. Medications  No. Follow up visit  No.  Do you have questions or concerns about your Care? No.  Actions: * If pain score is 4 or above: No action needed, pain <4.

## 2022-03-16 DIAGNOSIS — E1122 Type 2 diabetes mellitus with diabetic chronic kidney disease: Secondary | ICD-10-CM | POA: Diagnosis not present

## 2022-03-17 ENCOUNTER — Other Ambulatory Visit: Payer: Self-pay

## 2022-03-17 DIAGNOSIS — D500 Iron deficiency anemia secondary to blood loss (chronic): Secondary | ICD-10-CM

## 2022-03-18 ENCOUNTER — Other Ambulatory Visit: Payer: Self-pay

## 2022-03-18 ENCOUNTER — Other Ambulatory Visit (HOSPITAL_COMMUNITY): Payer: Self-pay | Admitting: *Deleted

## 2022-03-18 ENCOUNTER — Encounter: Payer: Self-pay | Admitting: Gastroenterology

## 2022-03-21 ENCOUNTER — Encounter (HOSPITAL_COMMUNITY)
Admission: RE | Admit: 2022-03-21 | Discharge: 2022-03-21 | Disposition: A | Discharge status: Home / Self Care | Payer: Medicare Other | Source: Ambulatory Visit | Attending: Nephrology | Admitting: Nephrology

## 2022-03-21 ENCOUNTER — Other Ambulatory Visit: Payer: Self-pay

## 2022-03-21 ENCOUNTER — Telehealth: Payer: Self-pay

## 2022-03-21 VITALS — BP 141/67 | HR 63 | Temp 97.6°F | Resp 17 | Wt 194.0 lb

## 2022-03-21 DIAGNOSIS — N185 Chronic kidney disease, stage 5: Secondary | ICD-10-CM | POA: Diagnosis not present

## 2022-03-21 DIAGNOSIS — D631 Anemia in chronic kidney disease: Secondary | ICD-10-CM | POA: Diagnosis not present

## 2022-03-21 DIAGNOSIS — I8510 Secondary esophageal varices without bleeding: Secondary | ICD-10-CM

## 2022-03-21 LAB — POCT HEMOGLOBIN-HEMACUE: Hemoglobin: 7.4 g/dL — ABNORMAL LOW (ref 13.0–17.0)

## 2022-03-21 MED ORDER — EPOETIN ALFA-EPBX 10000 UNIT/ML IJ SOLN
INTRAMUSCULAR | Status: AC
  Filled 2022-03-21: qty 2

## 2022-03-21 MED ORDER — EPOETIN ALFA-EPBX 10000 UNIT/ML IJ SOLN
20000.0000 [IU] | INTRAMUSCULAR | Status: DC
  Administered 2022-03-21: 20000 [IU] via SUBCUTANEOUS

## 2022-03-21 MED ORDER — SODIUM CHLORIDE 0.9 % IV SOLN
510.0000 mg | INTRAVENOUS | Status: DC
  Administered 2022-03-21: 510 mg via INTRAVENOUS
  Filled 2022-03-21: qty 510

## 2022-03-21 NOTE — Progress Notes (Signed)
Notified aMBER AT [facility] of HGB 7.4, denied any SoB or Bleeding. Per [employer] we were okay to give as ordered.

## 2022-03-21 NOTE — Addendum Note (Signed)
Addended by: Berniece Salines A on: 03/21/2022 10:08 AM   Modules accepted: Orders

## 2022-03-21 NOTE — Telephone Encounter (Signed)
Pt scheduled for follow up appointment  on 05/13/22 and order placed for RUQ [place] as requested on 03/14/22 EGD report. Message sent to radiology scheduling for scheduling purposes. Pt verbalized understanding and had no other concerns at end of call.

## 2022-03-28 ENCOUNTER — Encounter (HOSPITAL_COMMUNITY): Payer: Medicare Other

## 2022-03-28 ENCOUNTER — Encounter (HOSPITAL_COMMUNITY): Payer: Self-pay | Admitting: Vascular Surgery

## 2022-03-28 NOTE — Progress Notes (Signed)
PCP - Dr Vassie Loll Eyk Cardiologist - Bishop Limbo, DO Gastro - Dr Bryan Lemma  Chest x-ray - 07/07/21 CE EKG - DOS Stress Test - 09/14/21 ECHO - 10/22/10 Cardiac Cath - 10/19/10  ICD Pacemaker/Loop - n/a  Sleep Study -  n/a CPAP - none  Libre 2 sensor on left upper arm  Do not take Actos diabetes medicines on the morning of surgery.  THE NIGHT BEFORE SURGERY, take 17 units of Lantus Insulin.  If your blood sugar is less than 70 mg/dL, you will need to treat for low blood sugar: Treat a low blood sugar (less than 70 mg/dL) with  cup of clear juice (cranberry or apple), 4 glucose tablets, OR glucose gel. Recheck blood sugar in 15 minutes after treatment (to make sure it is greater than 70 mg/dL). If your blood sugar is not greater than 70 mg/dL on recheck, call [phone] for further instructions.  Anesthesia review: Yes  STOP now taking any Aspirin (unless otherwise instructed by your surgeon), Aleve, Naproxen, Ibuprofen, Motrin, Advil, Goody's, BC's, all herbal medications, fish oil, and all vitamins.   Coronavirus Screening Does the patient have any of the following symptoms:  Cough yes/no: No Fever (>100.48F)  yes/no: No Runny nose yes/no: No Sore throat yes/no: No Difficulty breathing/shortness of breath  yes/no: No  Have you traveled in the last 14 days and where? yes/no: No  Wife Melody verbalized understanding of instructions that were given via phone.

## 2022-03-28 NOTE — Anesthesia Preprocedure Evaluation (Signed)
Anesthesia Evaluation  Patient identified by MRN, date of birth, ID band Patient awake    Reviewed: Allergy & Precautions, NPO status , Patient's Chart, lab work & pertinent test results  Airway Mallampati: II  TM Distance: >3 FB Neck ROM: Full    Dental no notable dental hx.    Pulmonary neg pulmonary ROS   Pulmonary exam normal        Cardiovascular hypertension, Pt. on medications and Pt. on home beta blockers + CAD, + Past MI and + Cardiac Stents   Rhythm:Regular Rate:Normal     Neuro/Psych negative neurological ROS  negative psych ROS   GI/Hepatic Neg liver ROS,GERD  ,,  Endo/Other  diabetes, Poorly Controlled, Type 2, Insulin Dependent    Renal/GU CRFRenal disease  negative genitourinary   Musculoskeletal negative musculoskeletal ROS (+)    Abdominal Normal abdominal exam  (+)   Peds  Hematology  (+) Blood dyscrasia, anemia   Anesthesia Other Findings   Reproductive/Obstetrics                             Anesthesia Physical Anesthesia Plan  ASA: 3  Anesthesia Plan: MAC and Regional   Post-op Pain Management: Regional block*   Induction: Intravenous  PONV Risk Score and Plan: 1 and Ondansetron, Dexamethasone, Propofol infusion, Midazolam and Treatment may vary due to age or medical condition  Airway Management Planned: Simple Face Mask, Natural Airway and Nasal Cannula  Additional Equipment: None  Intra-op Plan:   Post-operative Plan:   Informed Consent: I have reviewed the patients History and Physical, chart, labs and discussed the procedure including the risks, benefits and alternatives for the proposed anesthesia with the patient or authorized representative who has indicated his/her understanding and acceptance.     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   Last seen by cardiologist Dr. Quillian Quince at [facility] 11/24/2021.  Per note, "At the present time, he is doing fine with no chest pains or other symptoms of angina. He exercises regularly and without any cardiac limitations. 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 His jogging has reduced somewhat because of his right knee replacement. As part of his kidney transplant work-up, he had a stress test. He was unable to bring his heart rate up enough with the exercise test given his recent knee replacement and underwent a Lexiscan nuclear stress test which was normal. There is no need for any cardiac testing or other changes at this time. He will continue medical therapy to reduce his future cardiovascular disease risk with aspirin, carvedilol, and atorvastatin."  Follows with nephrologist Dr. Joelyn Oms at [facility] for history of CKD 5 and anemia.  Per notes, baseline hemoglobin around 10.  However, preop labs for recent upper endoscopy for dysphagia 03/14/2022 showed hemoglobin 7.8.  Platelets also noted to be mildly low at 74k.  Recheck of hemoglobin on 03/21/2022 showed stable anemia with hemoglobin 7.4.  Of note, upper endoscopy showed grade 1 varices in the lower third of the esophagus.  Patient will need day of surgery labs and evaluation.  EKG 02/15/2021: NSR. Rate 61.  Nuclear stress 09/14/2021 (Care Everywhere): 1.) LIMITATIONS: None.  2.) MYOCARDIAL PERFUSION: Normal.  3.) LEFT VENTRICULAR EJECTION FRACTION: Normal.  4.) REGIONAL WALL MOTION: Normal.   TTE 08/25/2021 (Care Everywhere): SUMMARY  The left ventricular size is  normal.  There is mild concentric left ventricular hypertrophy.  Left ventricular systolic function is normal.  LV ejection fraction = 60-65%.  Left ventricular filling pattern is prolonged relaxation.  No segmental wall motion abnormalities seen in the left ventricle  The left atrium is borderline dilated.  No significant valvular stenosis or regurgitation.  The IVC is normal in size with an inspiratory collapse of greater then  50%, suggesting normal right atrial pressure.  There is no pericardial effusion.  Probably no significant change compared to the study dated 08/18/2020.    )        Anesthesia Quick Evaluation

## 2022-03-28 NOTE — Progress Notes (Signed)
Anesthesia Chart Review: Same-day work-up  Pertinent history includes insulin-dependent DM2 (uncontrolled (A1c 9.8 on 08/25/2021); with retinopathy, nephropathy), CKD (stage V), HLD, HTN, CAD (anterior STEMI 10/19/2010, s/p LAD stent), anemia, prostate cancer (s/p radical prostatectomy 2011). 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    Last seen by cardiologist Dr. Quillian Quince at [facility] 11/24/2021.  Per note, "At the present time, he is doing fine with no chest pains or other symptoms of angina. He exercises regularly and without any cardiac limitations. 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 His jogging has reduced somewhat because of his right knee replacement. As part of his kidney transplant work-up, he had a stress test. He was unable to bring his heart rate up enough with the exercise test given his recent knee replacement and underwent a Lexiscan nuclear stress test which was normal. There is no need for any cardiac testing or other changes at this time. He will continue medical therapy to reduce his future cardiovascular disease risk with aspirin, carvedilol, and atorvastatin."  Follows with nephrologist Dr. Joelyn Oms at [facility] for history of CKD 5 and anemia.  Per notes, baseline hemoglobin around 10.  However, preop labs for recent upper endoscopy for dysphagia 03/14/2022 showed hemoglobin 7.8.  Platelets also noted to be mildly low at 74k.  Recheck of hemoglobin on 03/21/2022 showed stable anemia with hemoglobin 7.4.  Of note, upper endoscopy showed grade 1 varices in the lower third of the esophagus.  Patient will need day of surgery labs and evaluation.  EKG 02/15/2021: NSR. Rate 61.  Nuclear stress 09/14/2021 (Care Everywhere): 1.) LIMITATIONS: None.  2.) MYOCARDIAL PERFUSION: Normal.  3.) LEFT VENTRICULAR EJECTION FRACTION: Normal.  4.) REGIONAL WALL MOTION: Normal.   TTE  08/25/2021 (Care Everywhere): SUMMARY  The left ventricular size is normal.  There is mild concentric left ventricular hypertrophy.  Left ventricular systolic function is normal.  LV ejection fraction = 60-65%.  Left ventricular filling pattern is prolonged relaxation.  No segmental wall motion abnormalities seen in the left ventricle  The left atrium is borderline dilated.  No significant valvular stenosis or regurgitation.  The IVC is normal in size with an inspiratory collapse of greater then  50%, suggesting normal right atrial pressure.  There is no pericardial effusion.  Probably no significant change compared to the study dated 08/18/2020.      Wynonia Musty [facility] Short Stay Center/Anesthesiology Phone [phone] 03/28/2022 11:36 AM

## 2022-03-29 ENCOUNTER — Ambulatory Visit (HOSPITAL_BASED_OUTPATIENT_CLINIC_OR_DEPARTMENT_OTHER): Payer: Medicare Other | Admitting: Physician Assistant

## 2022-03-29 ENCOUNTER — Other Ambulatory Visit: Payer: Self-pay

## 2022-03-29 ENCOUNTER — Encounter (HOSPITAL_COMMUNITY)
Admission: RE | Disposition: A | Discharge status: Home / Self Care | Payer: Self-pay | Source: Home / Self Care | Attending: Vascular Surgery

## 2022-03-29 ENCOUNTER — Encounter (HOSPITAL_COMMUNITY): Payer: Self-pay | Admitting: Vascular Surgery

## 2022-03-29 ENCOUNTER — Ambulatory Visit (HOSPITAL_COMMUNITY)
Admission: RE | Admit: 2022-03-29 | Discharge: 2022-03-29 | Disposition: A | Discharge status: Home / Self Care | Payer: Medicare Other | Attending: Vascular Surgery | Admitting: Vascular Surgery

## 2022-03-29 ENCOUNTER — Ambulatory Visit (HOSPITAL_COMMUNITY): Payer: Medicare Other | Admitting: Physician Assistant

## 2022-03-29 DIAGNOSIS — I2510 Atherosclerotic heart disease of native coronary artery without angina pectoris: Secondary | ICD-10-CM

## 2022-03-29 DIAGNOSIS — D631 Anemia in chronic kidney disease: Secondary | ICD-10-CM

## 2022-03-29 DIAGNOSIS — Z8546 Personal history of malignant neoplasm of prostate: Secondary | ICD-10-CM | POA: Diagnosis not present

## 2022-03-29 DIAGNOSIS — I252 Old myocardial infarction: Secondary | ICD-10-CM | POA: Diagnosis not present

## 2022-03-29 DIAGNOSIS — Z794 Long term (current) use of insulin: Secondary | ICD-10-CM | POA: Insufficient documentation

## 2022-03-29 DIAGNOSIS — E1165 Type 2 diabetes mellitus with hyperglycemia: Secondary | ICD-10-CM | POA: Insufficient documentation

## 2022-03-29 DIAGNOSIS — N185 Chronic kidney disease, stage 5: Secondary | ICD-10-CM

## 2022-03-29 DIAGNOSIS — I120 Hypertensive chronic kidney disease with stage 5 chronic kidney disease or end stage renal disease: Secondary | ICD-10-CM | POA: Diagnosis not present

## 2022-03-29 DIAGNOSIS — E1122 Type 2 diabetes mellitus with diabetic chronic kidney disease: Secondary | ICD-10-CM | POA: Insufficient documentation

## 2022-03-29 DIAGNOSIS — K219 Gastro-esophageal reflux disease without esophagitis: Secondary | ICD-10-CM | POA: Insufficient documentation

## 2022-03-29 DIAGNOSIS — N186 End stage renal disease: Secondary | ICD-10-CM | POA: Diagnosis not present

## 2022-03-29 DIAGNOSIS — Z955 Presence of coronary angioplasty implant and graft: Secondary | ICD-10-CM | POA: Diagnosis not present

## 2022-03-29 DIAGNOSIS — N189 Chronic kidney disease, unspecified: Secondary | ICD-10-CM | POA: Diagnosis not present

## 2022-03-29 DIAGNOSIS — Z79899 Other long term (current) drug therapy: Secondary | ICD-10-CM | POA: Insufficient documentation

## 2022-03-29 DIAGNOSIS — I129 Hypertensive chronic kidney disease with stage 1 through stage 4 chronic kidney disease, or unspecified chronic kidney disease: Secondary | ICD-10-CM | POA: Diagnosis not present

## 2022-03-29 HISTORY — PX: AV FISTULA PLACEMENT: SHX1204

## 2022-03-29 HISTORY — DX: Fatty (change of) liver, not elsewhere classified: K76.0

## 2022-03-29 HISTORY — DX: Ocular hypertension, unspecified eye: H40.059

## 2022-03-29 LAB — POCT I-STAT, CHEM 8
BUN: 44 mg/dL — ABNORMAL HIGH (ref 8–23)
Calcium, Ion: 1.14 mmol/L — ABNORMAL LOW (ref 1.15–1.40)
Chloride: 109 mmol/L (ref 98–111)
Creatinine, Ser: 5.4 mg/dL — ABNORMAL HIGH (ref 0.61–1.24)
Glucose, Bld: 178 mg/dL — ABNORMAL HIGH (ref 70–99)
HCT: 25 % — ABNORMAL LOW (ref 39.0–52.0)
Hemoglobin: 8.5 g/dL — ABNORMAL LOW (ref 13.0–17.0)
Potassium: 4.3 mmol/L (ref 3.5–5.1)
Sodium: 141 mmol/L (ref 135–145)
TCO2: 18 mmol/L — ABNORMAL LOW (ref 22–32)

## 2022-03-29 LAB — SURGICAL PCR SCREEN
MRSA, PCR: NEGATIVE
Staphylococcus aureus: NEGATIVE

## 2022-03-29 LAB — GLUCOSE, CAPILLARY
Glucose-Capillary: 173 mg/dL — ABNORMAL HIGH (ref 70–99)
Glucose-Capillary: 170 mg/dL — ABNORMAL HIGH (ref 70–99)

## 2022-03-29 SURGERY — ARTERIOVENOUS (AV) FISTULA CREATION
Anesthesia: Monitor Anesthesia Care | Laterality: Left

## 2022-03-29 MED ORDER — ORAL CARE MOUTH RINSE: 15.0000 mL | Freq: Once | OROMUCOSAL | Status: AC

## 2022-03-29 MED ORDER — HEPARIN 6000 UNIT IRRIGATION SOLUTION
Status: DC | PRN
  Administered 2022-03-29: 1

## 2022-03-29 MED ORDER — SODIUM CHLORIDE 0.9 % IV SOLN: INTRAVENOUS | Status: DC

## 2022-03-29 MED ORDER — MIDAZOLAM HCL 2 MG/2ML IJ SOLN: 1.0000 mg | Freq: Once | INTRAMUSCULAR | Status: AC

## 2022-03-29 MED ORDER — FENTANYL CITRATE (PF) 100 MCG/2ML IJ SOLN
INTRAMUSCULAR | Status: AC
  Administered 2022-03-29: 50 ug via INTRAVENOUS
  Filled 2022-03-29: qty 2

## 2022-03-29 MED ORDER — PROPOFOL 10 MG/ML IV BOLUS
INTRAVENOUS | Status: DC | PRN
  Administered 2022-03-29: 20 mg via INTRAVENOUS

## 2022-03-29 MED ORDER — PROPOFOL 500 MG/50ML IV EMUL
INTRAVENOUS | Status: DC | PRN
  Administered 2022-03-29: 80 ug/kg/min via INTRAVENOUS

## 2022-03-29 MED ORDER — MIDAZOLAM HCL 2 MG/2ML IJ SOLN
INTRAMUSCULAR | Status: AC
  Administered 2022-03-29: 1 mg via INTRAVENOUS
  Filled 2022-03-29: qty 2

## 2022-03-29 MED ORDER — CHLORHEXIDINE GLUCONATE 4 % EX LIQD: 60.0000 mL | Freq: Once | CUTANEOUS | Status: DC

## 2022-03-29 MED ORDER — HEPARIN 6000 UNIT IRRIGATION SOLUTION
Status: AC
  Filled 2022-03-29: qty 500

## 2022-03-29 MED ORDER — CEFAZOLIN SODIUM-DEXTROSE 2-4 GM/100ML-% IV SOLN
2.0000 g | INTRAVENOUS | Status: AC
  Administered 2022-03-29: 2 g via INTRAVENOUS
  Filled 2022-03-29: qty 100

## 2022-03-29 MED ORDER — PHENYLEPHRINE HCL-NACL 20-0.9 MG/250ML-% IV SOLN
INTRAVENOUS | Status: DC | PRN
  Administered 2022-03-29: 20 ug/min via INTRAVENOUS

## 2022-03-29 MED ORDER — FENTANYL CITRATE (PF) 100 MCG/2ML IJ SOLN: 25.0000 ug | INTRAMUSCULAR | Status: DC | PRN

## 2022-03-29 MED ORDER — CHLORHEXIDINE GLUCONATE 0.12 % MT SOLN
15.0000 mL | Freq: Once | OROMUCOSAL | Status: AC
  Administered 2022-03-29: 15 mL via OROMUCOSAL
  Filled 2022-03-29: qty 15

## 2022-03-29 MED ORDER — INSULIN ASPART 100 UNIT/ML IJ SOLN: 0.0000 [IU] | INTRAMUSCULAR | Status: DC | PRN

## 2022-03-29 MED ORDER — LIDOCAINE HCL (PF) 1 % IJ SOLN
INTRAMUSCULAR | Status: DC | PRN
  Administered 2022-03-29: 30 mL via SUBCUTANEOUS

## 2022-03-29 MED ORDER — LIDOCAINE-EPINEPHRINE (PF) 1.5 %-1:200000 IJ SOLN
INTRAMUSCULAR | Status: DC | PRN
  Administered 2022-03-29: 30 mL via PERINEURAL

## 2022-03-29 MED ORDER — ACETAMINOPHEN 10 MG/ML IV SOLN: 1000.0000 mg | Freq: Once | INTRAVENOUS | Status: DC | PRN

## 2022-03-29 MED ORDER — OXYCODONE HCL 5 MG PO TABS: 5.0000 mg | ORAL_TABLET | Freq: Four times a day (QID) | ORAL | 0 refills | Status: DC | PRN

## 2022-03-29 MED ORDER — FENTANYL CITRATE (PF) 100 MCG/2ML IJ SOLN: 50.0000 ug | Freq: Once | INTRAMUSCULAR | Status: AC

## 2022-03-29 MED ORDER — PHENYLEPHRINE 80 MCG/ML (10ML) SYRINGE FOR IV PUSH (FOR BLOOD PRESSURE SUPPORT)
PREFILLED_SYRINGE | INTRAVENOUS | Status: DC | PRN
  Administered 2022-03-29: 80 ug via INTRAVENOUS
  Administered 2022-03-29: 160 ug via INTRAVENOUS

## 2022-03-29 SURGICAL SUPPLY — 28 items
ADH SKN CLS APL DERMABOND .7 (GAUZE/BANDAGES/DRESSINGS) ×1
ARMBAND PINK RESTRICT EXTREMIT (MISCELLANEOUS) ×4 IMPLANT
BAG COUNTER SPONGE SURGICOUNT (BAG) ×2 IMPLANT
BAG SPNG CNTER NS LX DISP (BAG) ×1
CANISTER SUCT 3000ML PPV (MISCELLANEOUS) ×2 IMPLANT
CANNULA VESSEL 3MM 2 BLNT TIP (CANNULA) ×2 IMPLANT
CLIP LIGATING EXTRA MED SLVR (CLIP) ×2 IMPLANT
CLIP LIGATING EXTRA SM BLUE (MISCELLANEOUS) ×2 IMPLANT
COVER PROBE W GEL 5X96 (DRAPES) ×2 IMPLANT
DERMABOND ADVANCED .7 DNX12 (GAUZE/BANDAGES/DRESSINGS) ×2 IMPLANT
ELECT REM PT RETURN 9FT ADLT (ELECTROSURGICAL) ×1
ELECTRODE REM PT RTRN 9FT ADLT (ELECTROSURGICAL) ×2 IMPLANT
GLOVE SS BIOGEL STRL SZ 7.5 (GLOVE) ×2 IMPLANT
GLOVE SURG MICRO LTX SZ7.5 (GLOVE) ×2 IMPLANT
GOWN STRL REUS W/ TWL LRG LVL3 (GOWN DISPOSABLE) ×6 IMPLANT
GOWN STRL REUS W/TWL LRG LVL3 (GOWN DISPOSABLE) ×3
KIT BASIN OR (CUSTOM PROCEDURE TRAY) ×2 IMPLANT
KIT TURNOVER KIT B (KITS) ×2 IMPLANT
NS IRRIG 1000ML POUR BTL (IV SOLUTION) ×2 IMPLANT
PACK CV ACCESS (CUSTOM PROCEDURE TRAY) ×2 IMPLANT
PAD ARMBOARD 7.5X6 YLW CONV (MISCELLANEOUS) ×4 IMPLANT
SPIKE FLUID TRANSFER (MISCELLANEOUS) ×2 IMPLANT
SUT PROLENE 6 0 CC (SUTURE) ×2 IMPLANT
SUT VIC AB 3-0 SH 27 (SUTURE) ×1
SUT VIC AB 3-0 SH 27X BRD (SUTURE) ×2 IMPLANT
TOWEL GREEN STERILE (TOWEL DISPOSABLE) ×2 IMPLANT
UNDERPAD 30X36 HEAVY ABSORB (UNDERPADS AND DIAPERS) ×2 IMPLANT
WATER STERILE IRR 1000ML POUR (IV SOLUTION) ×2 IMPLANT

## 2022-03-29 NOTE — Op Note (Signed)
    OPERATIVE REPORT  DATE OF SURGERY: 03/29/2022  PATIENT: Jason Zimmerman, 66 y.o. male MRN: [id number]  DOB: 1956/02/04  PRE-OPERATIVE DIAGNOSIS: End-stage renal disease  POST-OPERATIVE DIAGNOSIS:  Same  PROCEDURE: Left for stage brachiobasilic fistula  SURGEON:  Curt Jews, M.D.  PHYSICIAN ASSISTANT: Schuh PA-C  The assistant was needed for exposure and to expedite the case  ANESTHESIA: Axillary block and local  EBL: per anesthesia record  Total I/O In: 300 [I.V.:300] Out: 30 [Blood:30]  BLOOD ADMINISTERED: none  DRAINS: none  SPECIMEN: none  COUNTS CORRECT:  YES  PATIENT DISPOSITION:  PACU - hemodynamically stable  PROCEDURE DETAILS: The patient was taken room placed supine additionally the area of the left arm was prepped draped you sterile fashion.  SonoSite ultrasound was used to visualize the veins.  The patient had a very small cephalic vein.  Did have a good brachial vein with multiple branches at the level of the antecubital space.  Incision was made over the brachial artery pulse using local anesthesia.  The artery was of good caliber with minimal atherosclerotic change.  A separate incision was made longitudinally over the basilic vein.  The vein was good caliber at the level of the elbow.  Did become much smaller after multiple branches below the elbow.  This portion of the basilic vein was required for adequate length to mobilize to the brachial artery.  Tributary branches were ligated with 3-0 and 4-0 silk ties and divided.  The vein was ligated distally and was gently dilated with heparinized saline.  The vein was mobilized to the level of the brachial artery through the subcutaneous tunnel.  The artery was occluded proximally and distally and was opened with an 11 blade and sent longitudinally with Potts scissors.  The was cut to the appropriate length and was spatulated and sewn end-to-side to the artery with a running 6-0 Prolene suture.  Clamps were  removed and good thrill was noted through the vein.  The patient did maintain a radial artery pulse.  The wounds were irrigated with saline.  Hemostasis was obtained electrocautery.  The wounds were closed with 3-0 Vicryl in the subcutaneous and subcuticular tissue.  Sterile dressing was applied and the patient was transferred to the recovery room in stable condition   [name], M.D., [facility] 03/29/2022 3:42 PM  Note: Portions of this report may have been transcribed using voice recognition software.  Every effort has been made to ensure accuracy; however, inadvertent computerized transcription errors may still be present.

## 2022-03-29 NOTE — Anesthesia Procedure Notes (Signed)
Anesthesia Regional Block: Supraclavicular block   Pre-Anesthetic Checklist: , timeout performed,  Correct Patient, Correct Site, Correct Laterality,  Correct Procedure, Correct Position, site marked,  Risks and benefits discussed,  Surgical consent,  Pre-op evaluation,  At surgeon's request and post-op pain management  Laterality: Left  Prep: Dura Prep       Needles:  Injection technique: Single-shot  Needle Type: Echogenic Stimulator Needle     Needle Length: 5cm  Needle Gauge: 20     Additional Needles:   Procedures:,,,, ultrasound used (permanent image in chart),,    Narrative:  Start time: 03/29/2022 1:23 PM End time: 03/29/2022 1:27 PM Injection made incrementally with aspirations every 5 mL.  Performed by: Personally  Anesthesiologist: Darral Dash, DO  Additional Notes: Patient identified. Risks/Benefits/Options discussed with patient including but not limited to bleeding, infection, nerve damage, failed block, incomplete pain control. Patient expressed understanding and wished to proceed. All questions were answered. Sterile technique was used throughout the entire procedure. Please see nursing notes for vital signs. Aspirated in 5cc intervals with injection for negative confirmation. Patient was given instructions on fall risk and not to get out of bed. All questions and concerns addressed with instructions to call with any issues or inadequate analgesia.

## 2022-03-29 NOTE — Discharge Instructions (Signed)
   [facility]  Discharge Instructions  AV Fistula or Graft Surgery for Dialysis Access  Please refer to the following instructions for your post-procedure care. Your surgeon or physician assistant will discuss any changes with you.  Activity  You may drive the day following your surgery, if you are comfortable and no longer taking prescription pain medication. Resume full activity as the soreness in your incision resolves.  Bathing/Showering  You may shower after you go home. Keep your incision dry for 48 hours. Do not soak in a bathtub, hot tub, or swim until the incision heals completely. You may not shower if you have a hemodialysis catheter.  Incision Care  Clean your incision with mild soap and water after 48 hours. Pat the area dry with a clean towel. You do not need a bandage unless otherwise instructed. Do not apply any ointments or creams to your incision. You may have skin glue on your incision. Do not peel it off. It will come off on its own in about one week. Your arm may swell a bit after surgery. To reduce swelling use pillows to elevate your arm so it is above your heart. Your doctor will tell you if you need to lightly wrap your arm with an ACE bandage.  Diet  Resume your normal diet. There are not special food restrictions following this procedure. In order to heal from your surgery, it is CRITICAL to get adequate nutrition. Your body requires vitamins, minerals, and protein. Vegetables are the best source of vitamins and minerals. Vegetables also provide the perfect balance of protein. Processed food has little nutritional value, so try to avoid this.  Medications  Resume taking all of your medications. If your incision is causing pain, you may take over-the counter pain relievers such as acetaminophen (Tylenol). If you were prescribed a stronger pain medication, please be aware these medications can cause nausea and constipation. Prevent  nausea by taking the medication with a snack or meal. Avoid constipation by drinking plenty of fluids and eating foods with high amount of fiber, such as fruits, vegetables, and grains.  Do not take Tylenol if you are taking prescription pain medications.  Follow up Your surgeon may want to see you in the office following your access surgery. If so, this will be arranged at the time of your surgery.  Please call us immediately for any of the following conditions:  Increased pain, redness, drainage (pus) from your incision site Fever of 101 degrees or higher Severe or worsening pain at your incision site Hand pain or numbness.  Reduce your risk of vascular disease:  Stop smoking. If you would like help, call [facility] at 1-800-QUIT-NOW (743) 849-2733) or [facility] [place] your cholesterol Maintain a desired weight Control your diabetes Keep your blood pressure down  Dialysis  It will take several weeks to several months for your new dialysis access to be ready for use. Your surgeon will determine when it is okay to use it. Your nephrologist will continue to direct your dialysis. You can continue to use your Permcath until your new access is ready for use.   03/29/2022 Ahyan Kreeger [id number] 24-Feb-1956  Surgeon(s): Early, Arvilla Meres, MD  Procedure(s): LEFT 1st stage basilic vein transposition  x Do not stick fistula for 12 weeks    If you have any questions, please call the office at [phone].

## 2022-03-29 NOTE — H&P (Signed)
Office Visit 02/23/2022 [facility] -Jason Zimmerman, Jason Dom, MD Vascular Surgery CKD (chronic kidney disease) stage 5, GFR less than 15 ml/min [facility]) Dx New Patient (Initial Visit); Referred by Rexene Agent, MD Reason for Visit   Additional Documentation  Vitals: BP 157/83 Important   (BP Location: Left Arm, Patient Position: Sitting, Cuff Size: Large)   Pulse 60   Temp 97.9 F (36.6 C)   Resp 20   Ht '5\' 10"'$  (1.778 m)   Wt 87.5 kg   SpO2 59% Important    BMI 27.69 kg/m   BSA 2.08 m  Flowsheets: Clinical Intake,   Vital Signs,   NEWS,   MEWS Score,   Anthropometrics  Encounter Info: Billing Info,   History,   Allergies,   Detailed Report   All Notes   Progress Notes by Waynetta Sandy, MD at 02/23/2022 11:00 AM  Author: Waynetta Sandy, MD Author Type: Physician Filed: 02/23/2022 11:36 AM  Note Status: Signed Cosign: Cosign Not Required Encounter Date: 02/23/2022  Editor: Waynetta Sandy, MD (Physician)             Expand All Collapse All    Patient ID: Jason Zimmerman, male   DOB: [birth year], 66 y.o.   MRN: [id number]   Reason for Consult: New Patient (Initial Visit)   Referred by Rexene Agent, MD   Subjective:    Subjective  HPI:   Jason Zimmerman is a 66 y.o. male history of coronary artery disease coded several years ago underwent coronary artery stent placement.  No previous history of vascular disease.  Does have hyperlipidemia hypertension.  Currently does take an aspirin and a statin.  He has never been on dialysis before.  He is right-hand dominant.  Continues to coach travel softball teams.       Past Medical History:  Diagnosis Date   Acute endophthalmitis     Anemia associated with chronic renal failure     Chronic kidney disease, stage 4 (severe) (HCC)     Coronary disease     Diabetic retinopathy (HCC)     Ganglion and cyst of synovium, tendon, and  bursa     Heart attack ([place]) 10/19/2010   Hyperlipidemia     Hypertension, renal     Ingrowing nail     Iron (Fe) deficiency anemia     Other staphylococcus infection in conditions classified elsewhere and of unspecified site     Prostate cancer [facility]) 2011   Routine general medical examination at a health care facility     RTA (renal tubular acidosis)     Secondary hyperparathyroidism, renal ([place])     Special screening for malignant neoplasm of prostate     Type II or unspecified type diabetes mellitus without mention of complication, not stated as uncontrolled     Unspecified osteomyelitis, site unspecified     Vitamin D deficiency           Family History  Problem Relation Age of Onset   Cancer Father          Prostate   Heart disease Father     Cirrhosis Maternal Grandmother     Colon cancer Neg Hx     Esophageal cancer Neg Hx           Past Surgical History:  Procedure Laterality Date   APPENDECTOMY       BACK SURGERY       BIOPSY  02/13/2018    Procedure: BIOPSY;  Surgeon: Lavena Bullion, DO;  Location: [facility];  Service: Gastroenterology;;   COLONOSCOPY WITH PROPOFOL N/A 02/13/2018    Procedure: COLONOSCOPY WITH PROPOFOL;  Surgeon: Lavena Bullion, DO;  Location: [facility];  Service: Gastroenterology;  Laterality: N/A;   CORONARY ANGIOPLASTY        by notes, LAD stent placed 10/2010 in setting of MI   ESOPHAGEAL DILATION   02/13/2018    Procedure: ESOPHAGEAL DILATION;  Surgeon: Lavena Bullion, DO;  Location: [facility];  Service: Gastroenterology;;   ESOPHAGOGASTRODUODENOSCOPY (EGD) WITH PROPOFOL N/A 02/13/2018    Procedure: ESOPHAGOGASTRODUODENOSCOPY (EGD) WITH PROPOFOL;  Surgeon: Lavena Bullion, DO;  Location: [facility];  Service: Gastroenterology;  Laterality: N/A;   NASAL SEPTUM SURGERY   1981   PROSTATE SURGERY       QUADRICEPS TENDON REPAIR Right 02/16/2021    Procedure: RIGHT KNEE OPEN REPAIR QUADRICEP TENDON;  Surgeon: Nicholes Stairs, MD;  Location: [facility];  Service: Orthopedics;  Laterality: Right;   VITRECTOMY   12/21/2006      Short Social History:  Social History        Tobacco Use   Smoking status: Never   Smokeless tobacco: Never  Substance Use Topics   Alcohol use: Not Currently      No Known Allergies         Current Outpatient Medications  Medication Sig Dispense Refill   acetaminophen (TYLENOL) 500 MG tablet Take 1,000 mg by mouth every 8 (eight) hours as needed for moderate pain.       aspirin EC 81 MG tablet Take 81 mg by mouth daily.       atorvastatin (LIPITOR) 80 MG tablet Take 80 mg by mouth daily.       carvedilol (COREG) 3.125 MG tablet Take 3.125 mg by mouth every 12 (twelve) hours.         ezetimibe (ZETIA) 10 MG tablet Take 10 mg by mouth daily.       glucose blood (FREESTYLE TEST STRIPS) test strip 1 each by Other route 2 (two) times daily as needed for other. PRN 102 each 5   insulin glargine (LANTUS) 100 UNIT/ML Solostar Pen Inject 45 Units into the skin at bedtime.       pioglitazone (ACTOS) 30 MG tablet Take 1 tablet by mouth daily. *APPOINTMENT NEEDED FOR FURTHER REFILLS* 30 tablet 0   pioglitazone (ACTOS) 45 MG tablet Take 45 mg by mouth daily.       sodium bicarbonate 650 MG tablet Take 1,300 mg by mouth 2 (two) times daily.       timolol (TIMOPTIC) 0.5 % ophthalmic solution Place 1 drop into the left eye 2 (two) times daily. 10 mL 2   nitroGLYCERIN (NITROSTAT) 0.4 MG SL tablet Place 0.4 mg under the tongue every 5 (five) minutes as needed for chest pain.  (Patient not taking: Reported on 01/19/2022)       ondansetron (ZOFRAN ODT) 4 MG disintegrating tablet Take 1 tablet (4 mg total) by mouth every 8 (eight) hours as needed. (Patient not taking: Reported on 01/19/2022) 20 tablet 0   sucralfate (CARAFATE) 1 GM/10ML suspension Take 10 mLs (1 g total) by mouth 4 (four) times daily. 420 mL 1    No current facility-administered medications for this visit.      Review of Systems   Constitutional:  Constitutional negative. HENT: HENT negative.  Eyes: Eyes negative.  Respiratory: Respiratory negative.  Cardiovascular: Cardiovascular negative.  GI: Gastrointestinal negative.  Musculoskeletal: Musculoskeletal negative.  Skin: Skin negative.  Neurological: Neurological negative. Hematologic: Hematologic/lymphatic negative.  Psychiatric: Psychiatric negative.          Objective:    Objective     Vitals:    02/23/22 1102  BP: (!) 157/83  Pulse: 60  Resp: 20  Temp: 97.9 F (36.6 C)  SpO2: (!) 59%  Weight: 193 lb (87.5 kg)  Height: '5\' 10"'$  (1.778 m)    Body mass index is 27.69 kg/m.   Physical Exam HENT:     Head: Normocephalic.     Nose: Nose normal.  Eyes:     Pupils: Pupils are equal, round, and reactive to light.  Cardiovascular:     Pulses:          Radial pulses are 2+ on the right side and 2+ on the left side.  Pulmonary:     Effort: Pulmonary effort is normal.  Abdominal:     General: Abdomen is flat.     Palpations: Abdomen is soft.  Musculoskeletal:     Right lower leg: No edema.     Left lower leg: No edema.  Skin:    General: Skin is warm and dry.     Capillary Refill: Capillary refill takes less than 2 seconds.  Neurological:     General: No focal deficit present.     Mental Status: He is alert.        Data: Right Cephalic   Diameter (cm)Depth (cm)Findings  +-----------------+-------------+----------+--------+  Shoulder             0.26                         +-----------------+-------------+----------+--------+  Prox upper arm       0.28                         +-----------------+-------------+----------+--------+  Mid upper arm        0.28                         +-----------------+-------------+----------+--------+  Dist upper arm       0.29                         +-----------------+-------------+----------+--------+  Antecubital fossa    0.31                          +-----------------+-------------+----------+--------+  Prox forearm         0.26                         +-----------------+-------------+----------+--------+  Mid forearm          0.25                         +-----------------+-------------+----------+--------+  Dist forearm         0.21                         +-----------------+-------------+----------+--------+   +-----------------+-------------+----------+--------+  Right Basilic    Diameter (cm)Depth (cm)Findings  +-----------------+-------------+----------+--------+  Prox upper arm       0.32                         +-----------------+-------------+----------+--------+  Mid upper arm        0.33                         +-----------------+-------------+----------+--------+  Dist upper arm       0.34                         +-----------------+-------------+----------+--------+  Antecubital fossa    0.32                         +-----------------+-------------+----------+--------+   +-----------------+-------------+----------+--------+  Left Cephalic    Diameter (cm)Depth (cm)Findings  +-----------------+-------------+----------+--------+  Shoulder             0.28                         +-----------------+-------------+----------+--------+  Prox upper arm       0.20                         +-----------------+-------------+----------+--------+  Mid upper arm        0.25                         +-----------------+-------------+----------+--------+  Dist upper arm       0.23                         +-----------------+-------------+----------+--------+  Antecubital fossa    0.24                         +-----------------+-------------+----------+--------+  Prox forearm         0.18                         +-----------------+-------------+----------+--------+  Mid forearm          0.13                          +-----------------+-------------+----------+--------+  Dist forearm         0.12                         +-----------------+-------------+----------+--------+   +-----------------+-------------+----------+--------+  Left Basilic     Diameter (cm)Depth (cm)Findings  +-----------------+-------------+----------+--------+  Prox upper arm       0.52                         +-----------------+-------------+----------+--------+  Mid upper arm        0.44                         +-----------------+-------------+----------+--------+  Dist upper arm       0.44                         +-----------------+-------------+----------+--------+  Antecubital fossa    0.43                         +-----------------+-------------+----------+--------+   Summary: Right: Patent and compressible cephalic and basilic veins.  Left: Patent and compressible cephalic and basilic veins.    Right Pre-Dialysis Findings:  +-----------------------+----------+--------------------+---------+--------  +  Location  PSV (cm/s)Intralum. Diam. (cm)Waveform  Comments  +-----------------------+----------+--------------------+---------+--------  +  Brachial Antecub. fossa81        0.58                triphasic            +-----------------------+----------+--------------------+---------+--------  +  Radial Art at Wrist    71        0.24                triphasic            +-----------------------+----------+--------------------+---------+--------  +  Ulnar Art at Wrist     56        0.25                triphasic            +-----------------------+----------+--------------------+---------+--------  +     Left Pre-Dialysis Findings:  +-----------------------+----------+--------------------+---------+--------  +  Location               PSV (cm/s)Intralum. Diam. (cm)Waveform  Comments   +-----------------------+----------+--------------------+---------+--------  +  Brachial Antecub. fossa90        0.45                triphasic            +-----------------------+----------+--------------------+---------+--------  +  Radial Art at Wrist    79        0.25                triphasic            +-----------------------+----------+--------------------+---------+--------  +  Ulnar Art at Wrist     64        0.24                triphasic            +-----------------------+----------+--------------------+---------+--------  +         Summary:     Right: No obstruction visualized in the right upper extremity.  Left: No obstruction visualized in the left upper extremity.         Assessment/Plan:    Assessment 66 year old male with CKD 5 now sent for dialysis access evaluation.  He is right-hand dominant without any previous chest breasts or upper extremity surgeries.  Plan will be for left arm AV fistula versus graft in the future.  I discussed the risk benefits and alternatives as well as the likely need for multiple procedures particularly with the left basilic vein fistula creation.  We also discussed the risk of primary and on failure and steal and need for catheter if dialysis is needed prior to fistula maturation.  Patient and his wife demonstrate good understanding.         Waynetta Sandy MD [facility]           Addendum:  The patient has been re-examined and re-evaluated.  The patient's history and physical has been reviewed and is unchanged.    Trentyn Sales is a 65 y.o. male is being admitted with CKD V. All the risks, benefits and other treatment options have been discussed with the patient. The patient has consented to proceed with Procedure(s): LEFT ARM ARTERIOVENOUS (AV) FISTULA VERSUS ARTERIOVENOUS GRAFT CREATION as a surgical intervention.  Nobuo Nunziata 03/29/2022 12:55  PM Vascular and Vein Surgery

## 2022-03-29 NOTE — Anesthesia Procedure Notes (Signed)
Procedure Name: General with mask airway Date/Time: 03/29/2022 1:57 PM  Performed by: Erick Colace, CRNAPre-anesthesia Checklist: Patient identified, Emergency Drugs available, Suction available and Patient being monitored Patient Re-evaluated:Patient Re-evaluated prior to induction Oxygen Delivery Method: Simple face mask Preoxygenation: Pre-oxygenation with 100% oxygen Induction Type: IV induction Dental Injury: Teeth and Oropharynx as per pre-operative assessment

## 2022-03-29 NOTE — Transfer of Care (Signed)
Immediate Anesthesia Transfer of Care Note  Patient: Kaleth Koy  Procedure(s) Performed: LEFT ARM ARTERIOVENOUS (AV) FISTULA VERSUS ARTERIOVENOUS GRAFT CREATION (Left)  Patient Location: PACU  Anesthesia Type:MAC combined with regional for post-op pain  Level of Consciousness: awake and alert   Airway & Oxygen Therapy: Patient Spontanous Breathing  Post-op Assessment: Report given to RN and Post -op Vital signs reviewed and stable  Post vital signs: Reviewed and stable  Last Vitals:  Vitals Value Taken Time  BP 111/76 03/29/22 1557  Temp    Pulse 72 03/29/22 1559  Resp 19 03/29/22 1559  SpO2 96 % 03/29/22 1559  Vitals shown include unvalidated device data.  Last Pain:  Vitals:   03/29/22 0954  TempSrc:   PainSc: 0-No pain         Complications: No notable events documented.

## 2022-03-29 NOTE — Anesthesia Postprocedure Evaluation (Signed)
Anesthesia Post Note  Patient: Jason Zimmerman  Procedure(s) Performed: LEFT ARM ARTERIOVENOUS (AV) FISTULA VERSUS ARTERIOVENOUS GRAFT CREATION (Left)     Patient location during evaluation: PACU Anesthesia Type: Regional and MAC Level of consciousness: awake and alert Pain management: pain level controlled Vital Signs Assessment: post-procedure vital signs reviewed and stable Respiratory status: spontaneous breathing, nonlabored ventilation, respiratory function stable and patient connected to nasal cannula oxygen Cardiovascular status: stable and blood pressure returned to baseline Postop Assessment: no apparent nausea or vomiting Anesthetic complications: no   No notable events documented.  Last Vitals:  Vitals:   03/29/22 1612 03/29/22 1615  BP: (!) 149/71 128/64  Pulse: 65 64  Resp: 16 14  Temp:  36.5 C  SpO2: 98% 96%    Last Pain:  Vitals:   03/29/22 1615  TempSrc:   PainSc: 0-No pain                 Belenda Cruise P Linkon Siverson

## 2022-03-30 ENCOUNTER — Encounter: Payer: Self-pay | Admitting: Gastroenterology

## 2022-03-30 ENCOUNTER — Telehealth: Payer: Self-pay | Admitting: Gastroenterology

## 2022-03-30 ENCOUNTER — Encounter (HOSPITAL_COMMUNITY): Payer: Self-pay | Admitting: Vascular Surgery

## 2022-03-30 NOTE — Telephone Encounter (Signed)
Inbound call from patients wife stating that patient is scheduled for a capsule endoscopy tomorrow and patient has lost his instructions. Requesting a call back to discuss. Please advise.

## 2022-03-30 NOTE — Telephone Encounter (Signed)
Called pt back and pt stated that he found instructions. Pt had no questions at end of call.

## 2022-03-31 ENCOUNTER — Telehealth: Payer: Self-pay | Admitting: Physician Assistant

## 2022-03-31 ENCOUNTER — Ambulatory Visit (INDEPENDENT_AMBULATORY_CARE_PROVIDER_SITE_OTHER): Payer: Medicare Other | Admitting: Gastroenterology

## 2022-03-31 DIAGNOSIS — K766 Portal hypertension: Secondary | ICD-10-CM | POA: Diagnosis not present

## 2022-03-31 DIAGNOSIS — D500 Iron deficiency anemia secondary to blood loss (chronic): Secondary | ICD-10-CM

## 2022-03-31 DIAGNOSIS — K3189 Other diseases of stomach and duodenum: Secondary | ICD-10-CM | POA: Diagnosis not present

## 2022-03-31 NOTE — Telephone Encounter (Signed)
-----   Message from Gabriel Earing, [place] sent at 03/29/2022  3:50 PM EST ----- S/p left 1st stage BVT 11/28.  F/u in 6 weeks on [facility] day with dialysis duplex.  Thanks

## 2022-03-31 NOTE — Patient Instructions (Signed)

## 2022-03-31 NOTE — Progress Notes (Signed)
SN: [id number] Exp: 2023-08-03 LOT: [id number] Patient arrived for Capsule Endoscopy. Reported the prep went well. This nurse explained dietary restrictions for the next few hours. Patient verbalized understanding. Opened capsule, ensured capsule was flashing prior to the patient swallowing the capsule. Patient swallowed capsule without difficulty. Patient instructed to return to the office at 4:00 pm today for removal of the recording equipment, to call the office with any questions and if no capsule was visualized after 72 hours. No further questions by the conclusion of the visit.     Video Capsule Endoscopy report: Procedure findings: 1) Complete capsule study with poor prep 2) Gastritis/Gastropathy 3) There is copious amount of dark fluid/material in the stomach and seen intermittently throughout the small bowel.  This does obscure some of the small bowel mucosa, but no fresh bleeding or clear stigmata of small bowel bleeding noted on this study.  Summary and Recommendation: Complete capsule study with poor prep which was limited by dark fluid/material in the stomach and small bowel.  Based on his clinical history, I suspect intermittent oozing from his previously noted moderate portal hypertensive gastropathy (which was also seen on this study).  Otherwise, no additional clear small bowel noted on this study.  - Continue surveillance CBC checks along with intermittent iron panels with IV iron in the future as needed - Follow-up in the GI clinic as scheduled  Gerrit Heck, DO, [facility] Gastroenter[facility]

## 2022-04-04 ENCOUNTER — Ambulatory Visit (HOSPITAL_COMMUNITY)
Admission: RE | Admit: 2022-04-04 | Discharge: 2022-04-04 | Disposition: A | Discharge status: Home / Self Care | Payer: Medicare Other | Source: Ambulatory Visit | Attending: Gastroenterology | Admitting: Gastroenterology

## 2022-04-04 DIAGNOSIS — I8510 Secondary esophageal varices without bleeding: Secondary | ICD-10-CM

## 2022-04-04 DIAGNOSIS — K828 Other specified diseases of gallbladder: Secondary | ICD-10-CM | POA: Diagnosis not present

## 2022-04-09 DIAGNOSIS — I517 Cardiomegaly: Secondary | ICD-10-CM | POA: Diagnosis not present

## 2022-04-09 DIAGNOSIS — E1165 Type 2 diabetes mellitus with hyperglycemia: Secondary | ICD-10-CM | POA: Diagnosis not present

## 2022-04-09 DIAGNOSIS — E872 Acidosis, unspecified: Secondary | ICD-10-CM | POA: Diagnosis not present

## 2022-04-09 DIAGNOSIS — Z4822 Encounter for aftercare following kidney transplant: Secondary | ICD-10-CM | POA: Diagnosis not present

## 2022-04-09 DIAGNOSIS — Z01818 Encounter for other preprocedural examination: Secondary | ICD-10-CM | POA: Diagnosis not present

## 2022-04-09 DIAGNOSIS — E875 Hyperkalemia: Secondary | ICD-10-CM | POA: Diagnosis not present

## 2022-04-09 DIAGNOSIS — E8770 Fluid overload, unspecified: Secondary | ICD-10-CM | POA: Diagnosis not present

## 2022-04-09 DIAGNOSIS — N189 Chronic kidney disease, unspecified: Secondary | ICD-10-CM | POA: Diagnosis not present

## 2022-04-09 DIAGNOSIS — I9589 Other hypotension: Secondary | ICD-10-CM | POA: Diagnosis not present

## 2022-04-09 DIAGNOSIS — D631 Anemia in chronic kidney disease: Secondary | ICD-10-CM | POA: Diagnosis not present

## 2022-04-09 DIAGNOSIS — Z79899 Other long term (current) drug therapy: Secondary | ICD-10-CM | POA: Diagnosis not present

## 2022-04-09 DIAGNOSIS — D3000 Benign neoplasm of unspecified kidney: Secondary | ICD-10-CM | POA: Diagnosis not present

## 2022-04-09 DIAGNOSIS — E11319 Type 2 diabetes mellitus with unspecified diabetic retinopathy without macular edema: Secondary | ICD-10-CM | POA: Diagnosis not present

## 2022-04-09 DIAGNOSIS — Z20822 Contact with and (suspected) exposure to covid-19: Secondary | ICD-10-CM | POA: Diagnosis not present

## 2022-04-09 DIAGNOSIS — I358 Other nonrheumatic aortic valve disorders: Secondary | ICD-10-CM | POA: Diagnosis not present

## 2022-04-09 DIAGNOSIS — E1122 Type 2 diabetes mellitus with diabetic chronic kidney disease: Secondary | ICD-10-CM | POA: Diagnosis not present

## 2022-04-09 DIAGNOSIS — H5462 Unqualified visual loss, left eye, normal vision right eye: Secondary | ICD-10-CM | POA: Diagnosis not present

## 2022-04-09 DIAGNOSIS — I120 Hypertensive chronic kidney disease with stage 5 chronic kidney disease or end stage renal disease: Secondary | ICD-10-CM | POA: Diagnosis not present

## 2022-04-09 DIAGNOSIS — N186 End stage renal disease: Secondary | ICD-10-CM | POA: Diagnosis not present

## 2022-04-09 DIAGNOSIS — D696 Thrombocytopenia, unspecified: Secondary | ICD-10-CM | POA: Diagnosis not present

## 2022-04-09 DIAGNOSIS — Z01810 Encounter for preprocedural cardiovascular examination: Secondary | ICD-10-CM | POA: Diagnosis not present

## 2022-04-09 DIAGNOSIS — D62 Acute posthemorrhagic anemia: Secondary | ICD-10-CM | POA: Diagnosis not present

## 2022-04-09 DIAGNOSIS — Z794 Long term (current) use of insulin: Secondary | ICD-10-CM | POA: Diagnosis not present

## 2022-04-09 DIAGNOSIS — N184 Chronic kidney disease, stage 4 (severe): Secondary | ICD-10-CM | POA: Diagnosis not present

## 2022-04-09 DIAGNOSIS — D84821 Immunodeficiency due to drugs: Secondary | ICD-10-CM | POA: Diagnosis not present

## 2022-04-09 DIAGNOSIS — E782 Mixed hyperlipidemia: Secondary | ICD-10-CM | POA: Diagnosis not present

## 2022-04-09 DIAGNOSIS — I2510 Atherosclerotic heart disease of native coronary artery without angina pectoris: Secondary | ICD-10-CM | POA: Diagnosis not present

## 2022-04-09 DIAGNOSIS — I10 Essential (primary) hypertension: Secondary | ICD-10-CM | POA: Diagnosis not present

## 2022-04-09 DIAGNOSIS — Z8674 Personal history of sudden cardiac arrest: Secondary | ICD-10-CM | POA: Diagnosis not present

## 2022-04-09 DIAGNOSIS — I469 Cardiac arrest, cause unspecified: Secondary | ICD-10-CM | POA: Diagnosis not present

## 2022-04-09 DIAGNOSIS — R944 Abnormal results of kidney function studies: Secondary | ICD-10-CM | POA: Diagnosis not present

## 2022-04-09 DIAGNOSIS — I252 Old myocardial infarction: Secondary | ICD-10-CM | POA: Diagnosis not present

## 2022-04-09 DIAGNOSIS — Z940 Kidney transplant status: Secondary | ICD-10-CM | POA: Diagnosis not present

## 2022-04-09 DIAGNOSIS — K660 Peritoneal adhesions (postprocedural) (postinfection): Secondary | ICD-10-CM | POA: Diagnosis not present

## 2022-04-11 ENCOUNTER — Inpatient Hospital Stay (HOSPITAL_COMMUNITY): Admission: RE | Admit: 2022-04-11 | Payer: Medicare Other | Source: Ambulatory Visit

## 2022-04-11 ENCOUNTER — Encounter: Payer: Self-pay | Admitting: Internal Medicine

## 2022-04-15 DIAGNOSIS — Z79899 Other long term (current) drug therapy: Secondary | ICD-10-CM | POA: Diagnosis not present

## 2022-04-15 DIAGNOSIS — E1122 Type 2 diabetes mellitus with diabetic chronic kidney disease: Secondary | ICD-10-CM | POA: Diagnosis not present

## 2022-04-15 DIAGNOSIS — D849 Immunodeficiency, unspecified: Secondary | ICD-10-CM | POA: Diagnosis not present

## 2022-04-15 DIAGNOSIS — Z4822 Encounter for aftercare following kidney transplant: Secondary | ICD-10-CM | POA: Diagnosis not present

## 2022-04-15 DIAGNOSIS — Z794 Long term (current) use of insulin: Secondary | ICD-10-CM | POA: Diagnosis not present

## 2022-04-15 DIAGNOSIS — E119 Type 2 diabetes mellitus without complications: Secondary | ICD-10-CM | POA: Diagnosis not present

## 2022-04-15 DIAGNOSIS — E785 Hyperlipidemia, unspecified: Secondary | ICD-10-CM | POA: Diagnosis not present

## 2022-04-15 DIAGNOSIS — Z8546 Personal history of malignant neoplasm of prostate: Secondary | ICD-10-CM | POA: Diagnosis not present

## 2022-04-15 DIAGNOSIS — I2510 Atherosclerotic heart disease of native coronary artery without angina pectoris: Secondary | ICD-10-CM | POA: Diagnosis not present

## 2022-04-15 DIAGNOSIS — I252 Old myocardial infarction: Secondary | ICD-10-CM | POA: Diagnosis not present

## 2022-04-15 DIAGNOSIS — Z940 Kidney transplant status: Secondary | ICD-10-CM | POA: Diagnosis not present

## 2022-04-15 DIAGNOSIS — D649 Anemia, unspecified: Secondary | ICD-10-CM | POA: Diagnosis not present

## 2022-04-15 DIAGNOSIS — Z79624 Long term (current) use of inhibitors of nucleotide synthesis: Secondary | ICD-10-CM | POA: Diagnosis not present

## 2022-04-15 DIAGNOSIS — N1330 Unspecified hydronephrosis: Secondary | ICD-10-CM | POA: Diagnosis not present

## 2022-04-15 DIAGNOSIS — Z960 Presence of urogenital implants: Secondary | ICD-10-CM | POA: Diagnosis not present

## 2022-04-15 DIAGNOSIS — I10 Essential (primary) hypertension: Secondary | ICD-10-CM | POA: Diagnosis not present

## 2022-04-15 DIAGNOSIS — Z79621 Long term (current) use of calcineurin inhibitor: Secondary | ICD-10-CM | POA: Diagnosis not present

## 2022-04-15 DIAGNOSIS — R7989 Other specified abnormal findings of blood chemistry: Secondary | ICD-10-CM | POA: Diagnosis not present

## 2022-04-18 ENCOUNTER — Encounter (HOSPITAL_COMMUNITY): Payer: Medicare Other

## 2022-04-18 DIAGNOSIS — Z8719 Personal history of other diseases of the digestive system: Secondary | ICD-10-CM | POA: Diagnosis not present

## 2022-04-18 DIAGNOSIS — K760 Fatty (change of) liver, not elsewhere classified: Secondary | ICD-10-CM | POA: Diagnosis not present

## 2022-04-18 DIAGNOSIS — Z79621 Long term (current) use of calcineurin inhibitor: Secondary | ICD-10-CM | POA: Diagnosis not present

## 2022-04-18 DIAGNOSIS — R7989 Other specified abnormal findings of blood chemistry: Secondary | ICD-10-CM | POA: Diagnosis not present

## 2022-04-18 DIAGNOSIS — I151 Hypertension secondary to other renal disorders: Secondary | ICD-10-CM | POA: Diagnosis not present

## 2022-04-18 DIAGNOSIS — Z79899 Other long term (current) drug therapy: Secondary | ICD-10-CM | POA: Diagnosis not present

## 2022-04-18 DIAGNOSIS — D649 Anemia, unspecified: Secondary | ICD-10-CM | POA: Diagnosis not present

## 2022-04-18 DIAGNOSIS — Z5181 Encounter for therapeutic drug level monitoring: Secondary | ICD-10-CM | POA: Diagnosis not present

## 2022-04-18 DIAGNOSIS — Z8546 Personal history of malignant neoplasm of prostate: Secondary | ICD-10-CM | POA: Diagnosis not present

## 2022-04-18 DIAGNOSIS — Z955 Presence of coronary angioplasty implant and graft: Secondary | ICD-10-CM | POA: Diagnosis not present

## 2022-04-18 DIAGNOSIS — Z9079 Acquired absence of other genital organ(s): Secondary | ICD-10-CM | POA: Diagnosis not present

## 2022-04-18 DIAGNOSIS — Z8674 Personal history of sudden cardiac arrest: Secondary | ICD-10-CM | POA: Diagnosis not present

## 2022-04-18 DIAGNOSIS — I25118 Atherosclerotic heart disease of native coronary artery with other forms of angina pectoris: Secondary | ICD-10-CM | POA: Diagnosis not present

## 2022-04-18 DIAGNOSIS — D696 Thrombocytopenia, unspecified: Secondary | ICD-10-CM | POA: Diagnosis not present

## 2022-04-18 DIAGNOSIS — Z940 Kidney transplant status: Secondary | ICD-10-CM | POA: Diagnosis not present

## 2022-04-18 DIAGNOSIS — Z4822 Encounter for aftercare following kidney transplant: Secondary | ICD-10-CM | POA: Diagnosis not present

## 2022-04-18 DIAGNOSIS — E782 Mixed hyperlipidemia: Secondary | ICD-10-CM | POA: Diagnosis not present

## 2022-04-18 DIAGNOSIS — E1165 Type 2 diabetes mellitus with hyperglycemia: Secondary | ICD-10-CM | POA: Diagnosis not present

## 2022-04-18 DIAGNOSIS — I10 Essential (primary) hypertension: Secondary | ICD-10-CM | POA: Diagnosis not present

## 2022-04-20 DIAGNOSIS — I10 Essential (primary) hypertension: Secondary | ICD-10-CM | POA: Diagnosis not present

## 2022-04-20 DIAGNOSIS — Z940 Kidney transplant status: Secondary | ICD-10-CM | POA: Diagnosis not present

## 2022-04-20 DIAGNOSIS — D84821 Immunodeficiency due to drugs: Secondary | ICD-10-CM | POA: Diagnosis not present

## 2022-04-20 DIAGNOSIS — N186 End stage renal disease: Secondary | ICD-10-CM | POA: Diagnosis not present

## 2022-04-20 DIAGNOSIS — I120 Hypertensive chronic kidney disease with stage 5 chronic kidney disease or end stage renal disease: Secondary | ICD-10-CM | POA: Diagnosis not present

## 2022-04-20 DIAGNOSIS — R7989 Other specified abnormal findings of blood chemistry: Secondary | ICD-10-CM | POA: Diagnosis not present

## 2022-04-20 DIAGNOSIS — I25118 Atherosclerotic heart disease of native coronary artery with other forms of angina pectoris: Secondary | ICD-10-CM | POA: Diagnosis not present

## 2022-04-22 DIAGNOSIS — Z7952 Long term (current) use of systemic steroids: Secondary | ICD-10-CM | POA: Diagnosis not present

## 2022-04-22 DIAGNOSIS — D8989 Other specified disorders involving the immune mechanism, not elsewhere classified: Secondary | ICD-10-CM | POA: Diagnosis not present

## 2022-04-22 DIAGNOSIS — Z9089 Acquired absence of other organs: Secondary | ICD-10-CM | POA: Diagnosis not present

## 2022-04-22 DIAGNOSIS — D696 Thrombocytopenia, unspecified: Secondary | ICD-10-CM | POA: Diagnosis not present

## 2022-04-22 DIAGNOSIS — Z8546 Personal history of malignant neoplasm of prostate: Secondary | ICD-10-CM | POA: Diagnosis not present

## 2022-04-22 DIAGNOSIS — Z940 Kidney transplant status: Secondary | ICD-10-CM | POA: Diagnosis not present

## 2022-04-22 DIAGNOSIS — R609 Edema, unspecified: Secondary | ICD-10-CM | POA: Diagnosis not present

## 2022-04-22 DIAGNOSIS — Z4822 Encounter for aftercare following kidney transplant: Secondary | ICD-10-CM | POA: Diagnosis not present

## 2022-04-22 DIAGNOSIS — R7989 Other specified abnormal findings of blood chemistry: Secondary | ICD-10-CM | POA: Diagnosis not present

## 2022-04-22 DIAGNOSIS — I10 Essential (primary) hypertension: Secondary | ICD-10-CM | POA: Diagnosis not present

## 2022-04-22 DIAGNOSIS — Z79621 Long term (current) use of calcineurin inhibitor: Secondary | ICD-10-CM | POA: Diagnosis not present

## 2022-04-22 DIAGNOSIS — Z79620 Long term (current) use of immunosuppressive biologic: Secondary | ICD-10-CM | POA: Diagnosis not present

## 2022-04-22 DIAGNOSIS — E785 Hyperlipidemia, unspecified: Secondary | ICD-10-CM | POA: Diagnosis not present

## 2022-04-22 DIAGNOSIS — D649 Anemia, unspecified: Secondary | ICD-10-CM | POA: Diagnosis not present

## 2022-04-22 DIAGNOSIS — Z792 Long term (current) use of antibiotics: Secondary | ICD-10-CM | POA: Diagnosis not present

## 2022-04-22 DIAGNOSIS — I252 Old myocardial infarction: Secondary | ICD-10-CM | POA: Diagnosis not present

## 2022-04-22 DIAGNOSIS — I2510 Atherosclerotic heart disease of native coronary artery without angina pectoris: Secondary | ICD-10-CM | POA: Diagnosis not present

## 2022-04-22 DIAGNOSIS — E119 Type 2 diabetes mellitus without complications: Secondary | ICD-10-CM | POA: Diagnosis not present

## 2022-04-26 DIAGNOSIS — Z79899 Other long term (current) drug therapy: Secondary | ICD-10-CM | POA: Diagnosis not present

## 2022-04-26 DIAGNOSIS — D649 Anemia, unspecified: Secondary | ICD-10-CM | POA: Diagnosis not present

## 2022-04-26 DIAGNOSIS — E119 Type 2 diabetes mellitus without complications: Secondary | ICD-10-CM | POA: Diagnosis not present

## 2022-04-26 DIAGNOSIS — I951 Orthostatic hypotension: Secondary | ICD-10-CM | POA: Diagnosis not present

## 2022-04-26 DIAGNOSIS — D849 Immunodeficiency, unspecified: Secondary | ICD-10-CM | POA: Diagnosis not present

## 2022-04-26 DIAGNOSIS — Z940 Kidney transplant status: Secondary | ICD-10-CM | POA: Diagnosis not present

## 2022-04-26 DIAGNOSIS — I959 Hypotension, unspecified: Secondary | ICD-10-CM | POA: Diagnosis not present

## 2022-04-26 DIAGNOSIS — I25118 Atherosclerotic heart disease of native coronary artery with other forms of angina pectoris: Secondary | ICD-10-CM | POA: Diagnosis not present

## 2022-04-26 DIAGNOSIS — E785 Hyperlipidemia, unspecified: Secondary | ICD-10-CM | POA: Diagnosis not present

## 2022-04-26 DIAGNOSIS — I10 Essential (primary) hypertension: Secondary | ICD-10-CM | POA: Diagnosis not present

## 2022-04-26 DIAGNOSIS — I252 Old myocardial infarction: Secondary | ICD-10-CM | POA: Diagnosis not present

## 2022-04-26 DIAGNOSIS — E1122 Type 2 diabetes mellitus with diabetic chronic kidney disease: Secondary | ICD-10-CM | POA: Diagnosis not present

## 2022-04-26 DIAGNOSIS — Z8546 Personal history of malignant neoplasm of prostate: Secondary | ICD-10-CM | POA: Diagnosis not present

## 2022-04-26 DIAGNOSIS — Z7982 Long term (current) use of aspirin: Secondary | ICD-10-CM | POA: Diagnosis not present

## 2022-04-26 DIAGNOSIS — Z9079 Acquired absence of other genital organ(s): Secondary | ICD-10-CM | POA: Diagnosis not present

## 2022-04-26 DIAGNOSIS — Z7952 Long term (current) use of systemic steroids: Secondary | ICD-10-CM | POA: Diagnosis not present

## 2022-04-26 DIAGNOSIS — Z79621 Long term (current) use of calcineurin inhibitor: Secondary | ICD-10-CM | POA: Diagnosis not present

## 2022-04-26 DIAGNOSIS — Z4822 Encounter for aftercare following kidney transplant: Secondary | ICD-10-CM | POA: Diagnosis not present

## 2022-04-26 DIAGNOSIS — Z79624 Long term (current) use of inhibitors of nucleotide synthesis: Secondary | ICD-10-CM | POA: Diagnosis not present

## 2022-04-28 DIAGNOSIS — N1832 Chronic kidney disease, stage 3b: Secondary | ICD-10-CM | POA: Diagnosis not present

## 2022-04-28 DIAGNOSIS — I129 Hypertensive chronic kidney disease with stage 1 through stage 4 chronic kidney disease, or unspecified chronic kidney disease: Secondary | ICD-10-CM | POA: Diagnosis not present

## 2022-04-28 DIAGNOSIS — Z940 Kidney transplant status: Secondary | ICD-10-CM | POA: Diagnosis not present

## 2022-04-28 DIAGNOSIS — E1122 Type 2 diabetes mellitus with diabetic chronic kidney disease: Secondary | ICD-10-CM | POA: Diagnosis not present

## 2022-04-29 DIAGNOSIS — K760 Fatty (change of) liver, not elsewhere classified: Secondary | ICD-10-CM | POA: Diagnosis not present

## 2022-04-29 DIAGNOSIS — Z9079 Acquired absence of other genital organ(s): Secondary | ICD-10-CM | POA: Diagnosis not present

## 2022-04-29 DIAGNOSIS — I252 Old myocardial infarction: Secondary | ICD-10-CM | POA: Diagnosis not present

## 2022-04-29 DIAGNOSIS — Z955 Presence of coronary angioplasty implant and graft: Secondary | ICD-10-CM | POA: Diagnosis not present

## 2022-04-29 DIAGNOSIS — I2510 Atherosclerotic heart disease of native coronary artery without angina pectoris: Secondary | ICD-10-CM | POA: Diagnosis not present

## 2022-04-29 DIAGNOSIS — D696 Thrombocytopenia, unspecified: Secondary | ICD-10-CM | POA: Diagnosis not present

## 2022-04-29 DIAGNOSIS — Z792 Long term (current) use of antibiotics: Secondary | ICD-10-CM | POA: Diagnosis not present

## 2022-04-29 DIAGNOSIS — Z4822 Encounter for aftercare following kidney transplant: Secondary | ICD-10-CM | POA: Diagnosis not present

## 2022-04-29 DIAGNOSIS — I959 Hypotension, unspecified: Secondary | ICD-10-CM | POA: Diagnosis not present

## 2022-04-29 DIAGNOSIS — Z7952 Long term (current) use of systemic steroids: Secondary | ICD-10-CM | POA: Diagnosis not present

## 2022-04-29 DIAGNOSIS — Z9889 Other specified postprocedural states: Secondary | ICD-10-CM | POA: Diagnosis not present

## 2022-04-29 DIAGNOSIS — E785 Hyperlipidemia, unspecified: Secondary | ICD-10-CM | POA: Diagnosis not present

## 2022-04-29 DIAGNOSIS — Z8546 Personal history of malignant neoplasm of prostate: Secondary | ICD-10-CM | POA: Diagnosis not present

## 2022-04-29 DIAGNOSIS — Z8719 Personal history of other diseases of the digestive system: Secondary | ICD-10-CM | POA: Diagnosis not present

## 2022-04-29 DIAGNOSIS — E11649 Type 2 diabetes mellitus with hypoglycemia without coma: Secondary | ICD-10-CM | POA: Diagnosis not present

## 2022-05-03 DIAGNOSIS — Z940 Kidney transplant status: Secondary | ICD-10-CM | POA: Diagnosis not present

## 2022-05-05 DIAGNOSIS — Z5181 Encounter for therapeutic drug level monitoring: Secondary | ICD-10-CM | POA: Diagnosis not present

## 2022-05-05 DIAGNOSIS — Z794 Long term (current) use of insulin: Secondary | ICD-10-CM | POA: Diagnosis not present

## 2022-05-05 DIAGNOSIS — Z7952 Long term (current) use of systemic steroids: Secondary | ICD-10-CM | POA: Diagnosis not present

## 2022-05-05 DIAGNOSIS — I951 Orthostatic hypotension: Secondary | ICD-10-CM | POA: Diagnosis not present

## 2022-05-05 DIAGNOSIS — Z955 Presence of coronary angioplasty implant and graft: Secondary | ICD-10-CM | POA: Diagnosis not present

## 2022-05-05 DIAGNOSIS — E1165 Type 2 diabetes mellitus with hyperglycemia: Secondary | ICD-10-CM | POA: Diagnosis not present

## 2022-05-05 DIAGNOSIS — Z8546 Personal history of malignant neoplasm of prostate: Secondary | ICD-10-CM | POA: Diagnosis not present

## 2022-05-05 DIAGNOSIS — Z792 Long term (current) use of antibiotics: Secondary | ICD-10-CM | POA: Diagnosis not present

## 2022-05-05 DIAGNOSIS — R7401 Elevation of levels of liver transaminase levels: Secondary | ICD-10-CM | POA: Diagnosis not present

## 2022-05-05 DIAGNOSIS — Z79899 Other long term (current) drug therapy: Secondary | ICD-10-CM | POA: Diagnosis not present

## 2022-05-05 DIAGNOSIS — I2510 Atherosclerotic heart disease of native coronary artery without angina pectoris: Secondary | ICD-10-CM | POA: Diagnosis not present

## 2022-05-05 DIAGNOSIS — Z79621 Long term (current) use of calcineurin inhibitor: Secondary | ICD-10-CM | POA: Diagnosis not present

## 2022-05-05 DIAGNOSIS — I252 Old myocardial infarction: Secondary | ICD-10-CM | POA: Diagnosis not present

## 2022-05-05 DIAGNOSIS — Z79624 Long term (current) use of inhibitors of nucleotide synthesis: Secondary | ICD-10-CM | POA: Diagnosis not present

## 2022-05-05 DIAGNOSIS — E785 Hyperlipidemia, unspecified: Secondary | ICD-10-CM | POA: Diagnosis not present

## 2022-05-05 DIAGNOSIS — Z7982 Long term (current) use of aspirin: Secondary | ICD-10-CM | POA: Diagnosis not present

## 2022-05-05 DIAGNOSIS — Z4822 Encounter for aftercare following kidney transplant: Secondary | ICD-10-CM | POA: Diagnosis not present

## 2022-05-06 ENCOUNTER — Encounter (HOSPITAL_COMMUNITY): Payer: Self-pay

## 2022-05-10 DIAGNOSIS — Z940 Kidney transplant status: Secondary | ICD-10-CM | POA: Diagnosis not present

## 2022-05-10 DIAGNOSIS — E1122 Type 2 diabetes mellitus with diabetic chronic kidney disease: Secondary | ICD-10-CM | POA: Diagnosis not present

## 2022-05-10 DIAGNOSIS — I120 Hypertensive chronic kidney disease with stage 5 chronic kidney disease or end stage renal disease: Secondary | ICD-10-CM | POA: Diagnosis not present

## 2022-05-10 DIAGNOSIS — D84821 Immunodeficiency due to drugs: Secondary | ICD-10-CM | POA: Diagnosis not present

## 2022-05-10 DIAGNOSIS — N186 End stage renal disease: Secondary | ICD-10-CM | POA: Diagnosis not present

## 2022-05-11 ENCOUNTER — Encounter: Payer: Self-pay | Admitting: Gastroenterology

## 2022-05-13 ENCOUNTER — Encounter (HOSPITAL_COMMUNITY): Payer: Self-pay

## 2022-05-13 ENCOUNTER — Ambulatory Visit: Payer: Medicare Other | Admitting: Gastroenterology

## 2022-05-13 ENCOUNTER — Encounter: Payer: Self-pay | Admitting: Gastroenterology

## 2022-05-13 VITALS — BP 124/60 | HR 70 | Ht 70.0 in | Wt 184.0 lb

## 2022-05-13 DIAGNOSIS — K219 Gastro-esophageal reflux disease without esophagitis: Secondary | ICD-10-CM

## 2022-05-13 DIAGNOSIS — R748 Abnormal levels of other serum enzymes: Secondary | ICD-10-CM

## 2022-05-13 DIAGNOSIS — R1310 Dysphagia, unspecified: Secondary | ICD-10-CM

## 2022-05-13 NOTE — Progress Notes (Unsigned)
Chief Complaint:    Discuss ultrasound results, elevated liver enzymes, GERD  GI History: 67 year old male with a history of CKD 5 now s/p living donor transplant 04/2022, anemia of chronic disease on ESA therapy, prostate CA, CAD w/ hx of STEMI with cardiac arrest 2012 requiring DES, diabetes (A1c 9.8%), HLD, secondary hyperparathyroidism   -01/02/2018: Initial appointment in GI clinic for evaluation of dysphagia, GERD, and CRC screening.  Reports longstanding history of GERD, previously treated with PPI but that was stopped this CKD with subsequent worsening reflux symptoms.  Index symptoms of heartburn, regurgitation, nocturnal reflux. - 02/13/2018: EGD: Moderate stenosis at 39 cm dilated with 15 mm TTS balloon, LA Grade B esophagitis, Hill grade 3 valve, mild gastritis, duodenitis.  Recommended restarting PPI, start sucralfate - 02/13/2018: Colonoscopy: 2 mm rectal hyperplastic polyp, otherwise normal.  Normal TI.  Repeat 10 years -03/14/2022: EGD: Grade 1 esophageal varices, benign stricture in lower esophagus which was easily traversed.  Moderate gastritis and peptic duodenitis - 03/31/2022: VCE: Complete study, poor prep.  Gastritis, copious amounts of dark fluid/material in stomach and throughout small bowel with some obscuring visualization.  No fresh bleeding or clear stigmata of bleeding.  Highest suspicion for intermittent oozing from gastritis and recommend surveillance CBCs and intermittent iron panels with IV iron as needed  HPI:     Patient is a 67 y.o. male presenting to the Gastroenterology Clinic for follow-up.  Was last seen by me on 01/19/2022.  Was starting to have recurrence of solid food dysphagia.  Was not taking acid suppression therapy due to CKD.  Underwent repeat EGD on 03/14/2022 which showed benign stricture in the lower esophagus that was not dilated due to nonobstructing appearance and presence of esophageal varices, along with moderate gastritis and peptic duodenitis.   Additionally underwent VCE on 03/31/2022 as outlined above.   Since then, he underwent kidney transplant on 04/10/2022.  Currently treated with Prograf, mycophenolate, prednisone 5 mg/day, Valcyte prophylaxis.  Fluconazole and Bactrim prophylaxis on hold due to elevated liver enzymes  Was noted to have an elevated liver enzymes at recent follow-up with transplant nephrology - 03/14/2022: AST/ALT 20/21, ALP 137, T. bili 0.5 - 04/04/2022: RUQ [place]: Hepatic steatosis, normal vascular flow, cholelithiasis without cholecystitis - 04/29/2022: AST/ALT 99/199, ALP 256, T. bili 1.0 - 05/05/2022: AST/ALT 94/257, ALP 254, T. bili 0.8. - 05/10/2022: AST/ALT 55/105, ALP 222, T. bili 0.7  Starting oral iron this week.   Still intemrittent dysphagia with spicy, described more like spasm and will regurg food out, then can finish the meal. Happens intermittently. Has not had sxs since renal transplant.   Review of systems:     No chest pain, no SOB, no fevers, no urinary sx   Past Medical History:  Diagnosis Date   Acute endophthalmitis    Anemia associated with chronic renal failure    Chronic kidney disease, stage 4 (severe) (HCC)    Coronary disease    Diabetic retinopathy ([place])    Fatty liver    per patient's wife   Ganglion and cyst of synovium, tendon, and bursa    GERD (gastroesophageal reflux disease) 03/14/2022   no meds   Heart attack ([place]) 10/19/2010   Hyperlipidemia    Hypertension, renal    Ingrowing nail    Iron (Fe) deficiency anemia    hx iron infusion   Ocular hypertension    Other staphylococcus infection in conditions classified elsewhere and of unspecified site    Prostate cancer ([place]) 2011  Routine general medical examination at a health care facility    RTA (renal tubular acidosis)    Secondary hyperparathyroidism, renal (HCC)    Special screening for malignant neoplasm of prostate    Type II or unspecified type diabetes mellitus without mention of complication, not stated  as uncontrolled    Unspecified osteomyelitis, site unspecified    Vitamin D deficiency     Patient's surgical history, family medical history, social history, medications and allergies were all reviewed in Epic    Current Outpatient Medications  Medication Sig Dispense Refill   acetaminophen (TYLENOL) 500 MG tablet Take 1,000 mg by mouth every 6 (six) hours as needed (pain.).     amoxicillin (AMOXIL) 500 MG capsule Take 2,000 mg by mouth See admin instructions. Take 4 capsules (2000 mg) by mouth 1 hour prior to dental appointments.     Ascorbic Acid (VITAMIN C) 1000 MG tablet Take 1,000 mg by mouth in the morning.     aspirin EC 81 MG tablet Take 81 mg by mouth at bedtime.     atorvastatin (LIPITOR) 80 MG tablet Take 80 mg by mouth in the morning.     carvedilol (COREG) 3.125 MG tablet Take 3.125 mg by mouth in the morning and at bedtime.     ezetimibe (ZETIA) 10 MG tablet Take 10 mg by mouth in the morning.     glucose blood (FREESTYLE TEST STRIPS) test strip 1 each by Other route 2 (two) times daily as needed for other. PRN 102 each 5   insulin glargine (LANTUS) 100 UNIT/ML Solostar Pen Inject 44 Units into the skin at bedtime.     insulin lispro (HUMALOG) 100 UNIT/ML KwikPen Inject 13 Units into the skin 3 (three) times daily.     iron polysaccharides (NIFEREX) 150 MG capsule Take 1 capsule by mouth daily.     mycophenolate (MYFORTIC) 180 MG EC tablet Take 360 mg by mouth 2 (two) times daily.     nitroGLYCERIN (NITROSTAT) 0.4 MG SL tablet Place 0.4 mg under the tongue every 5 (five) minutes x 3 doses as needed for chest pain.     omeprazole (PRILOSEC) 20 MG capsule Take 20 mg by mouth daily.     oxyCODONE (OXY IR/ROXICODONE) 5 MG immediate release tablet Take 1 tablet (5 mg total) by mouth every 6 (six) hours as needed for severe pain. 12 tablet 0   pioglitazone (ACTOS) 45 MG tablet Take 45 mg by mouth in the morning.     polyethylene glycol (MIRALAX / GLYCOLAX) 17 g packet Take 17 g by  mouth daily as needed.     predniSONE (DELTASONE) 5 MG tablet Take 5 mg by mouth daily with breakfast.     senna-docusate (SENOKOT-S) 8.6-50 MG tablet Take by mouth at bedtime as needed.     sodium bicarbonate 650 MG tablet Take 1,300 mg by mouth 2 (two) times daily.     tacrolimus (PROGRAF) 1 MG capsule Take 1.5 mg by mouth 2 (two) times daily.     timolol (TIMOPTIC) 0.5 % ophthalmic solution Place 1 drop into the left eye 2 (two) times daily. 10 mL 2   valGANciclovir (VALCYTE) 450 MG tablet Take 450 mg by mouth daily.     No current facility-administered medications for this visit.    Physical Exam:     BP 124/60   Pulse 70   Ht '5\' 10"'$  (1.778 m)   Wt 184 lb (83.5 kg)   BMI 26.40 kg/m   GENERAL:  Pleasant *** in NAD PSYCH: : Cooperative, normal affect EENT:  conjunctiva pink, mucous membranes moist, neck supple without masses CARDIAC:  RRR, ***murmur heard, no peripheral edema PULM: Normal respiratory effort, lungs CTA bilaterally, no wheezing ABDOMEN:  Nondistended, soft, nontender. No obvious masses, no hepatomegaly,  normal bowel sounds SKIN:  turgor, no lesions seen Musculoskeletal:  Normal muscle tone, normal strength NEURO: Alert and oriented x 3, no focal neurologic deficits   IMPRESSION and PLAN:    1) Esophageal varices 2) Moderate gastritis 3) Hepatic steatosis  4) Elevated liver enzymes - Downtrending on most recent labs - Repeat hepatic panel in 2 weeks - [place] work up in 3-4 months if needed: Fib-4, serologic, repeat US vs MRI liver vs elastography, etc  EM. Will call me if sxs stay roslved and will cancel  RTC in 4 months           Lavena Bullion ,DO, FACG 05/13/2022, 9:55 AM

## 2022-05-13 NOTE — Patient Instructions (Addendum)
Your provider has requested that you go to the basement level for lab work in 2 weeks. Press "B" on the elevator. The lab is located at the first door on the left as you exit the elevator.   Please follow up in 4 months. Give [place] a call at [phone] to schedule an appointment.    You have been scheduled for an esophageal manometry test at [facility] on 10/05/22 at 12:30 PM. Please arrive 30 minutes prior to your procedure for registration. You will need to go to outpatient registration (1st floor of the hospital) first. Make certain to bring your insurance cards as well as a complete list of medications.  Please remember the following:  1) Do not take any muscle relaxants, xanax (alprazolam) or ativan for 1 day prior to your test as well as the day of the test.  2) Nothing to eat or drink after 12:00 midnight on the night before your test.  3) Hold all diabetic medications/insulin the morning of the test. You may eat and take your medications after the test.  It will take at least 2 weeks to receive the results of this test from your physician.  ------------------------------------------ ABOUT ESOPHAGEAL MANOMETRY Esophageal manometry (muh-NOM-uh-tree) is a test that gauges how well your esophagus works. Your esophagus is the long, muscular tube that connects your throat to your stomach. Esophageal manometry measures the rhythmic muscle contractions (peristalsis) that occur in your esophagus when you swallow. Esophageal manometry also measures the coordination and force exerted by the muscles of your esophagus.  During esophageal manometry, a thin, flexible tube (catheter) that contains sensors is passed through your nose, down your esophagus and into your stomach. Esophageal manometry can be helpful in diagnosing some mostly uncommon disorders that affect your esophagus.  Why it's done Esophageal manometry is used to evaluate the movement (motility) of food through the esophagus and  into the stomach. The test measures how well the circular bands of muscle (sphincters) at the top and bottom of your esophagus open and close, as well as the pressure, strength and pattern of the wave of esophageal muscle contractions that moves food along.  What you can expect Esophageal manometry is an outpatient procedure done without sedation. Most people tolerate it well. You may be asked to change into a hospital gown before the test starts.  During esophageal manometry  While you are sitting up, a member of your health care team sprays your throat with a numbing medication or puts numbing gel in your nose or both.  A catheter is guided through your nose into your esophagus. The catheter may be sheathed in a water-filled sleeve. It doesn't interfere with your breathing. However, your eyes may water, and you may gag. You may have a slight nosebleed from irritation.  After the catheter is in place, you may be asked to lie on your back on an exam table, or you may be asked to remain seated.  You then swallow small sips of water. As you do, a computer connected to the catheter records the pressure, strength and pattern of your esophageal muscle contractions.  During the test, you'll be asked to breathe slowly and smoothly, remain as still as possible, and swallow only when you're asked to do so.  A member of your health care team may move the catheter down into your stomach while the catheter continues its measurements.  The catheter then is slowly withdrawn. The test usually lasts 20 to 30 minutes.  After esophageal  manometry  When your esophageal manometry is complete, you may return to your normal activities  This test typically takes 30-45 minutes to complete.  If you are age 69 or older, your body mass index should be between 23-30. Your Body mass index is 26.4 kg/m. If this is out of the aforementioned range listed, please consider follow up with your Primary Care  Provider.  __________________________________________________________  The [facility] providers would like to encourage you to use [facility] to communicate with providers for non-urgent requests or questions.  Due to long hold times on the telephone, sending your provider a message by [facility] may be a faster and more efficient way to get a response.  Please allow 48 business hours for a response.  Please remember that this is for non-urgent requests.   Due to recent changes in healthcare laws, you may see the results of your imaging and laboratory studies on MyChart before your provider has had a chance to review them.  We understand that in some cases there may be results that are confusing or concerning to you. Not all laboratory results come back in the same time frame and the provider may be waiting for multiple results in order to interpret others.  Please give [place] 48 hours in order for your provider to thoroughly review all the results before contacting the office for clarification of your results.     Thank you for choosing me and [facility].  Vito Cirigliano, D.O.

## 2022-05-16 ENCOUNTER — Telehealth: Payer: Self-pay

## 2022-05-16 ENCOUNTER — Encounter (HOSPITAL_COMMUNITY): Payer: Medicare Other

## 2022-05-16 ENCOUNTER — Ambulatory Visit: Payer: Medicare Other | Admitting: Internal Medicine

## 2022-05-16 ENCOUNTER — Encounter: Payer: Self-pay | Admitting: Internal Medicine

## 2022-05-16 ENCOUNTER — Encounter (HOSPITAL_COMMUNITY): Payer: Self-pay

## 2022-05-16 VITALS — BP 120/60 | HR 87 | Temp 97.4°F | Resp 16 | Ht 70.0 in | Wt 182.8 lb

## 2022-05-16 DIAGNOSIS — Z862 Personal history of diseases of the blood and blood-forming organs and certain disorders involving the immune mechanism: Secondary | ICD-10-CM

## 2022-05-16 DIAGNOSIS — I2510 Atherosclerotic heart disease of native coronary artery without angina pectoris: Secondary | ICD-10-CM | POA: Diagnosis not present

## 2022-05-16 DIAGNOSIS — E1121 Type 2 diabetes mellitus with diabetic nephropathy: Secondary | ICD-10-CM | POA: Diagnosis not present

## 2022-05-16 DIAGNOSIS — Z6826 Body mass index (BMI) 26.0-26.9, adult: Secondary | ICD-10-CM | POA: Insufficient documentation

## 2022-05-16 DIAGNOSIS — N2581 Secondary hyperparathyroidism of renal origin: Secondary | ICD-10-CM

## 2022-05-16 DIAGNOSIS — I10 Essential (primary) hypertension: Secondary | ICD-10-CM | POA: Diagnosis not present

## 2022-05-16 DIAGNOSIS — Z0000 Encounter for general adult medical examination without abnormal findings: Secondary | ICD-10-CM

## 2022-05-16 DIAGNOSIS — E113319 Type 2 diabetes mellitus with moderate nonproliferative diabetic retinopathy with macular edema, unspecified eye: Secondary | ICD-10-CM

## 2022-05-16 DIAGNOSIS — E7800 Pure hypercholesterolemia, unspecified: Secondary | ICD-10-CM

## 2022-05-16 DIAGNOSIS — Z8546 Personal history of malignant neoplasm of prostate: Secondary | ICD-10-CM

## 2022-05-16 DIAGNOSIS — E1159 Type 2 diabetes mellitus with other circulatory complications: Secondary | ICD-10-CM

## 2022-05-16 DIAGNOSIS — K760 Fatty (change of) liver, not elsewhere classified: Secondary | ICD-10-CM

## 2022-05-16 DIAGNOSIS — K219 Gastro-esophageal reflux disease without esophagitis: Secondary | ICD-10-CM

## 2022-05-16 DIAGNOSIS — Z794 Long term (current) use of insulin: Secondary | ICD-10-CM | POA: Diagnosis not present

## 2022-05-16 DIAGNOSIS — N189 Chronic kidney disease, unspecified: Secondary | ICD-10-CM | POA: Diagnosis not present

## 2022-05-16 DIAGNOSIS — E1122 Type 2 diabetes mellitus with diabetic chronic kidney disease: Secondary | ICD-10-CM | POA: Diagnosis not present

## 2022-05-16 DIAGNOSIS — Z940 Kidney transplant status: Secondary | ICD-10-CM

## 2022-05-16 NOTE — Assessment & Plan Note (Signed)
He has an appointment with [facility] today.  I talked to Dr. Joelyn Oms about blood work on the phone today.

## 2022-05-16 NOTE — Assessment & Plan Note (Signed)
His prostate cancer is in remission.

## 2022-05-16 NOTE — Assessment & Plan Note (Signed)
Continue to eat healthy and exercise.

## 2022-05-16 NOTE — Telephone Encounter (Signed)
Left message for pt to call back.

## 2022-05-16 NOTE — Assessment & Plan Note (Signed)
He had a STEMI of his LAD in the past with his last visit with cardiology in 10/2021.  They are seeing him yearly.  He denies any angina today.  We will continue with risk factor modifcaiton.

## 2022-05-16 NOTE — Assessment & Plan Note (Signed)
I reviewed his notes from Dr. Zadie Rhine who did his eye exam this past year.  We will continue to control his diabetes.

## 2022-05-16 NOTE — Assessment & Plan Note (Signed)
He is not on any blood pressure meds at this time and his BP is controlled.  He used to be on coreg with his history of CAD.  We will ctoninue to monitor.

## 2022-05-16 NOTE — Assessment & Plan Note (Signed)
He has diabetic nephropathy where he underwent kidney tranplantation last month.  He does see [facility] as well.  We will control his diabetes.

## 2022-05-16 NOTE — Assessment & Plan Note (Signed)
I want him to eat healthy and exercise.

## 2022-05-16 NOTE — Telephone Encounter (Signed)
-----  Message from [place], DO sent at 05/06/2022  4:21 PM EST ----- Can you please contact this patient to let him know the VCE results were reviewed and notable for the following:   Procedure findings: 1) Complete capsule study with poor prep 2) Gastritis/Gastropathy 3) There is copious amount of dark fluid/material in the stomach and seen intermittently throughout the small bowel.  This does obscure some of the small bowel mucosa, but no fresh bleeding or clear stigmata of small bowel bleeding noted on this study.  Summary and Recommendation: Complete capsule study with poor prep which was limited by dark fluid/material in the stomach and small bowel.  Based on his clinical history, I suspect intermittent oozing from his previously noted moderate portal hypertensive gastropathy (which was also seen on this study).  Otherwise, no additional clear small bowel noted on this study.  - Continue surveillance CBC checks along with intermittent iron panels with IV iron in the future as needed - Follow-up in the GI clinic as scheduled  ##Of note, I see that the patient actually just received a kidney transplant last month.  He has been following very closely in the transplant clinic, to include very close monitoring of his CBCs.  No additional labs need to be ordered at this time through [place].  He does have follow-up scheduled with me next week and can keep that appointment.

## 2022-05-16 NOTE — Assessment & Plan Note (Signed)
He was taken off his atorvastatin where he had elevated liver enzymes where he is on zetia.  Due to his history of CAD and Diabetes his LDL needs to be <50.  We will check a FLP today.

## 2022-05-16 NOTE — Assessment & Plan Note (Signed)
We will check iron studies today per Dr. Adin Hector recommendations.

## 2022-05-16 NOTE — Assessment & Plan Note (Signed)
I have reviewed his notes from the transplant team.  He is on immunosuppressants.  He is to followup with the team as directed.

## 2022-05-16 NOTE — Progress Notes (Signed)
Preventive Screening-Counseling & Management     Jason Zimmerman is a 67 y.o. male who presents for Medicare Annual/Subsequent preventive examination.  Jason Zimmerman is a 67 year old Caucasian/White male who presents for his annual health maintenance exam. This patient's past medical history CAD, Chronic Renal Failure, Hyperlipidemia, Hypertension, Myocardial Infarction, Type 2 diabetes mellitus with diabetic neuropathy, with long-term current use of insulin, and chronic anemia, history of prostate.   His last dilated eye exam was on 10/25/2021 where they noted he has moderate diabetic retinopathy with macular edema.  They noted this was stable.  He has had laser surgery done in the past.  He states there has been no worsening of his vision.  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 His last colonoscopy was done in 02/13/2018 that showed a 17m rectal hyperplastic polyp, otherwise normal and they want to repeat this in 10 years.  He was having problems with dysphagia and GERD and saw GI in 12/2017.  He had a repeat EGD done in 03/14/2022 due to reflux and dysphagia where they found Grade I varices in lower third of the esophagus as well as mild stenosis.  Due to the esophageal varices, they opted to not dilate.  There was diffuse moderate inflammation with gastritis in the stomach.  There was localized moderately erythmatous mucosa without active bleeding of the duodenal bulb.  His previous EGD done in 02/13/2018 showed moderate stenosis at 39 cm where he was dilated.  They also found LA Grade B esophagitis, Hill grade 3 valve, mild gastritis, duodenitis.  The patient followed up with GI in 03/31/2022 where he underwent capsule endoscopy which showed gastritis with copious amounts of dark fluid/material in stomach and throughout small bowel with some obscuring visualization.  No fresh bleeding or clear stigmata of bleeding.  Highest suspicion for  intermittent oozing from gastritis and recommend surveillance CBCs and intermittent iron panels with IV iron as needed.  He is on prilosec '20mg'$  daily.  They have also noted he had had some elevated liver enzymes and fatty liver disease in the GI clinic.  He has a history of prostate cancer where he had a prostatectomy in 2011. He has been in cancer remission. The patient denies any problems with urination. He is not seeing a urologist recently with his last visit about 8 years ago. 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 He does get yearly flu vaccine. He had a pneumovax 23 vaccine in 2014. I wrote him for the Prevnar 13 and shingrix vaccine in 2021 but he never did go get this done.  He has 6 COVID-19 vaccines including 4 boosters.  The patient denies any depression, anxiety or memory loss. He is on an ASA '81mg'$  daily.   The patient has a history of Stage V CKD where he underwent a renal transplant on 04/09/2022.  [facility] had [facility] fistula placed in his left arm in 11/2021.  He is currently on prednisone '5mg'$  daily, myfortic '360mg'$  BID, and prograf '1mg'$  BID and [facility]  He has anemia of chronic kidney disease and has been given IV iron in the past.  He progressed from Stage IV CKD where [facility]  They had him on oral bicarb for renal tubular acidosis and they also follow him  for anemia of chronic renal failure.  He has secondary hyperparathyroidism as well.    The patient is a 67 year old Caucasian/White male who returns for a follow-up visit for his T2 diabetes. He was diagnosed with diabetes in 2004.  He was seen by his endocrinologist in 06/2021.  The patient's current medications include:  Lantus 44 Units at night and Humalog 13 Units qAC.  He specifically denies unexplained abdominal pain, nausea or vomiting, or documented hypoglycemia. He has a freesyle libre  CGM where his sugars run 90-400.  He does have long term complications of CAD/PVD, diabetic retinopathy and diabetic nephropathy but no diabetic neuropathy.  He did go see his eye doctor, Dr. Zadie Rhine in [place] in 2023 as described above.   Jason Zimmerman returns today for routine followup on his cholesterol.  He was on atorvastatin but was taken off this medication a few weeks ago from what her thinks is due to elevated liver enzymes.  Overall, he states he is doing well and is without any complaints or problems at this time. He specifically denies chest pain, abdominal pain, nausea, diarrhea, and myalgias. He remains on dietary management as well as the following cholesterol lowering medications zetia '10mg'$  daily. He is fasting in anticipation for labs today.   This patient also has moderately severe CAD where he sustained a STEMI in 10/2010. He was taken to [facility] where he tells me he had a 100% occlusion of his left main. They ended up doing angioplasty and stent placement.  Unfortunately, his course was complicated in the Cath lab by cardiac arrest where he was successfully.   The patient is currently followed by Dr. Hunt Oris in Cardiology with last visit in 10/2021.  See PMH for summary of cardiac history and status of revascularization. His CAD is controlled with therapy as summarized in the medication list and previous notes. The patient has no comorbid conditions. He has no baseline symptoms of CAD. He has the following modifiable risk factor(s): HTN, DM, and hyperlipidemia. Specifically denied complaint(s): chest pain, palpitations, orthopnea, edema, exertional dyspnea, and syncope.    The patient is a 67 year old Caucasian/White male who presents for a follow-up evaluation of hypertension. The patient has been checking his blood pressure at home.  He states his systolic BP runs in the 194-174'Y.  The patient's current medications include: none.  He used to be on coreg 3.'125mg'$  BID  but he states they stopped it in 04/2022.  . The patient has been tolerating his medications well. The patient denies any headache, visual changes, dizziness, lightheadness, chest pain, shortness of breath, weakness/numbness, and edema. He reports there have been no other symptoms noted.              Are there smokers in your home (other than you)? No  Risk Factors Current exercise habits:  as above   Dietary issues discussed: none   Depression Screen (Note: if answer to either of the following is "Yes", a more complete depression screening is indicated)   Over the past two weeks, have you felt down, depressed or hopeless? No  Over the past two weeks, have you felt little interest or pleasure in doing things? No  Have you lost interest or pleasure in daily life? No  Do you often feel hopeless? No  Do you cry easily over simple problems? No  Activities of Daily Living In your present state of health, do you have any difficulty performing the following activities?:  Driving? No Managing money?  No Feeding yourself? No Getting from bed to chair? No Climbing a flight of stairs? No Preparing food and eating?: No Bathing or showering? No Getting dressed: No Getting to the toilet? No Using the toilet:No Moving around from place to place: No In the past year have you fallen or had a near fall?:No   Are you sexually active?  Yes  Do you have more than one partner?  No  Hearing Difficulties: No Do you often ask people to speak up or repeat themselves? No Do you experience ringing or noises in your ears? Yes Do you have difficulty understanding soft or whispered voices? No   Do you feel that you have a problem with memory? No  Do you often misplace items? No  Do you feel safe at home?  Yes  Cognitive Testing  Alert? Yes  Normal Appearance?Yes  Oriented to person? Yes  Place? Yes   Time? Yes  Recall of three objects?  Yes  Can perform simple calculations? Yes  Displays  appropriate judgment?Yes  Can read the correct time from a watch face?Yes  Fall Risk Prevention  Any stairs in or around the home? No  If so, are there any without handrails? No  Home free of loose throw rugs in walkways, pet beds, electrical cords, etc? No  Adequate lighting in your home to reduce risk of falls? Yes  Use of a cane, walker or w/c? No    Time Up and Go  Was the test performed? Yes .  Length of time to ambulate 10 feet: 10 sec.   Gait steady and fast without use of assistive device    Advanced Directives have been discussed with the patient? Yes   List the Names of Other Physician/Practitioners you currently use: Patient Care Team: Nona Dell, Corene Cornea, MD as PCP - General (Internal Medicine) Raynelle Bring, MD as Attending Physician (Urology) Zadie Rhine Clent Demark, MD as Attending Physician (Ophthalmology)    Past Medical History:  Diagnosis Date   Anemia associated with chronic renal failure    CKD (chronic kidney disease), stage V ([place])    Coronary disease    Diabetic nephropathy associated with type 2 diabetes mellitus ([place])    Diabetic retinopathy ([place])    Fatty liver    per patient's wife   GERD (gastroesophageal reflux disease) 03/14/2022   no meds   Heart attack ([place]) 10/19/2010   History of prostate cancer    Hyperlipidemia    Hypertension, renal    Ocular hypertension    Prostate cancer ([place]) 2011   RTA (renal tubular acidosis)    Secondary hyperparathyroidism, renal ([place])    Vitamin D deficiency     Past Surgical History:  Procedure Laterality Date   APPENDECTOMY     AV FISTULA PLACEMENT Left 03/29/2022   Procedure: LEFT ARM ARTERIOVENOUS (AV) FISTULA VERSUS ARTERIOVENOUS GRAFT CREATION;  Surgeon: [name], MD;  Location: [place];  Service: Vascular;  Laterality: Left;   BACK SURGERY     BIOPSY  02/13/2018   Procedure: BIOPSY;  Surgeon: Lavena Bullion, DO;  Location: [facility];  Service: Gastroenterology;;   COLONOSCOPY WITH PROPOFOL  N/A 02/13/2018   Procedure: COLONOSCOPY WITH PROPOFOL;  Surgeon: Lavena Bullion, DO;  Location: [facility];  Service: Gastroenterology;  Laterality: N/A;   CORONARY ANGIOPLASTY     by notes, LAD stent placed 10/2010 in setting of MI   ESOPHAGEAL DILATION  02/13/2018   Procedure: ESOPHAGEAL DILATION;  Surgeon:  Cirigliano, Vito V, DO;  Location: [facility];  Service: Gastroenterology;;   ESOPHAGOGASTRODUODENOSCOPY (EGD) WITH PROPOFOL N/A 02/13/2018   Procedure: ESOPHAGOGASTRODUODENOSCOPY (EGD) WITH PROPOFOL;  Surgeon: Lavena Bullion, DO;  Location: [facility];  Service: Gastroenterology;  Laterality: N/A;   NASAL SEPTUM SURGERY  1981   PROSTATE SURGERY     QUADRICEPS TENDON REPAIR Right 02/16/2021   Procedure: RIGHT KNEE OPEN REPAIR QUADRICEP TENDON;  Surgeon: Nicholes Stairs, MD;  Location: [facility];  Service: Orthopedics;  Laterality: Right;   UPPER GI ENDOSCOPY  03/14/2022   VITRECTOMY  12/21/2006      Current Medications  Current Outpatient Medications  Medication Sig Dispense Refill   acetaminophen (TYLENOL) 500 MG tablet Take 1,000 mg by mouth every 6 (six) hours as needed (pain.).     amoxicillin (AMOXIL) 500 MG capsule Take 2,000 mg by mouth See admin instructions. Take 4 capsules (2000 mg) by mouth 1 hour prior to dental appointments.     aspirin EC 81 MG tablet Take 81 mg by mouth at bedtime.     ezetimibe (ZETIA) 10 MG tablet Take 10 mg by mouth in the morning.     glucose blood (FREESTYLE TEST STRIPS) test strip 1 each by Other route 2 (two) times daily as needed for other. PRN 102 each 5   insulin glargine (LANTUS) 100 UNIT/ML Solostar Pen Inject 44 Units into the skin at bedtime.     insulin lispro (HUMALOG) 100 UNIT/ML KwikPen Inject 13 Units into the skin 3 (three) times daily.     mycophenolate (MYFORTIC) 180 MG EC tablet Take 360 mg by mouth 2 (two) times daily.     nitroGLYCERIN (NITROSTAT) 0.4 MG SL tablet Place 0.4 mg under the tongue every 5 (five)  minutes x 3 doses as needed for chest pain.     omeprazole (PRILOSEC) 20 MG capsule Take 20 mg by mouth daily.     polyethylene glycol (MIRALAX / GLYCOLAX) 17 g packet Take 17 g by mouth daily as needed.     predniSONE (DELTASONE) 5 MG tablet Take 5 mg by mouth daily with breakfast.     senna-docusate (SENOKOT-S) 8.6-50 MG tablet Take by mouth at bedtime as needed.     tacrolimus (PROGRAF) 1 MG capsule Take 1.5 mg by mouth 2 (two) times daily.     timolol (TIMOPTIC) 0.5 % ophthalmic solution Place 1 drop into the left eye 2 (two) times daily. 10 mL 2   valGANciclovir (VALCYTE) 450 MG tablet Take 450 mg by mouth daily.     No current facility-administered medications for this visit.    Allergies Cefazolin   Social History Social History   Tobacco Use   Smoking status: Never   Smokeless tobacco: Never  Substance Use Topics   Alcohol use: Not Currently     Review of Systems Review of Systems  Constitutional:  Negative for chills, fever, malaise/fatigue and weight loss.  HENT:  Positive for tinnitus. Negative for hearing loss.   Eyes:  Negative for blurred vision and double vision.  Respiratory:  Negative for cough, hemoptysis, shortness of breath and wheezing.   Cardiovascular:  Negative for chest pain, palpitations, orthopnea and leg swelling.  Gastrointestinal:  Negative for abdominal pain, blood in stool, constipation, diarrhea, heartburn, melena, nausea and vomiting.  Genitourinary:  Negative for frequency and hematuria.  Musculoskeletal:  Negative for back pain and myalgias.  Skin:  Negative for itching and rash.  Neurological:  Negative for dizziness, weakness and headaches.  Psychiatric/Behavioral:  Negative for depression. The patient is not nervous/anxious and does not have insomnia.      Physical Exam:      Body mass index is 26.23 kg/m. BP 120/60   Pulse 87   Temp (!) 97.4 F (36.3 C)   Resp 16   Ht '5\' 10"'$  (1.778 m)   Wt 182 lb 12.8 oz (82.9 kg)   SpO2 99%    BMI 26.23 kg/m   Physical Exam Constitutional:      Appearance: Normal appearance. He is not ill-appearing.  HENT:     Head: Normocephalic and atraumatic.     Right Ear: Tympanic membrane, ear canal and external ear normal.     Left Ear: Tympanic membrane, ear canal and external ear normal.     Nose: Nose normal. No congestion or rhinorrhea.     Mouth/Throat:     Mouth: Mucous membranes are dry.     Pharynx: Oropharynx is clear. No oropharyngeal exudate or posterior oropharyngeal erythema.  Eyes:     General: No scleral icterus.    Conjunctiva/sclera: Conjunctivae normal.     Pupils: Pupils are equal, round, and reactive to light.  Neck:     Vascular: No carotid bruit.  Cardiovascular:     Rate and Rhythm: Normal rate and regular rhythm.     Pulses: Normal pulses.     Heart sounds: No murmur heard.    No friction rub. No gallop.  Pulmonary:     Effort: Pulmonary effort is normal. No respiratory distress.     Breath sounds: No wheezing, rhonchi or rales.  Abdominal:     General: Abdomen is flat. Bowel sounds are normal. There is no distension.     Palpations: Abdomen is soft.     Tenderness: There is no abdominal tenderness.  Musculoskeletal:     Cervical back: Neck supple. No tenderness.     Right lower leg: No edema.     Left lower leg: No edema.  Lymphadenopathy:     Cervical: No cervical adenopathy.  Skin:    General: Skin is warm and dry.     Findings: No rash.  Neurological:     General: No focal deficit present.     Mental Status: He is alert and oriented to person, place, and time.  Psychiatric:        Mood and Affect: Mood normal.        Behavior: Behavior normal.      Assessment:      NAFL (nonalcoholic fatty liver)  Kidney transplanted  Chronic kidney disease, unspecified CKD stage  Moderate nonproliferative diabetic retinopathy with macular edema associated with type 2 diabetes mellitus, unspecified laterality (HCC)  Diabetic nephropathy  associated with type 2 diabetes mellitus (HCC)  History of anemia due to chronic kidney disease  Type 2 diabetes mellitus with other circulatory complication, with long-term current use of insulin (HCC)  Coronary artery disease involving native coronary artery of native heart without angina pectoris  Gastroesophageal reflux disease without esophagitis  History of prostate cancer  Essential hypertension  Hypercholesterolemia  Secondary hyperparathyroidism (HCC)  BMI 26.0-26.9,adult    Plan:     During the course of the visit the patient was educated and counseled about appropriate screening and preventive services including:   Pneumococcal vaccine  Influenza vaccine Colorectal cancer screening Diabetes screening  Diet review for nutrition referral? Yes ____  Not Indicated ____   Patient Instructions (the written plan) was given to the patient.  Essential hypertension He is  not on any blood pressure meds at this time and his BP is controlled.  He used to be on coreg with his history of CAD.  We will ctoninue to monitor.  CAD (coronary artery disease) He had a STEMI of his LAD in the past with his last visit with cardiology in 10/2021.  They are seeing him yearly.  He denies any angina today.  We will continue with risk factor modifcaiton.  NAFL (nonalcoholic fatty liver) I want him to eat healthy and exercise.  Gastroesophageal reflux disease He had dysphagia with GERD and underwent EGD with capsule endoscopy showing gastritis.  Continue to monitor his CBC and iron counts and continue on his PPI.  Secondary hyperparathyroidism [facility]) He has an appointment with [facility] today.  I talked to Dr. Joelyn Oms about blood work on the phone today.  Moderate nonproliferative diabetic retinopathy with macular edema associated with type 2 diabetes mellitus ([place]) I reviewed his notes from Dr. Zadie Rhine who did his eye exam this past year.  We will continue to control his  diabetes.  Diabetic nephropathy associated with type 2 diabetes mellitus ([place]) He has diabetic nephropathy where he underwent kidney tranplantation last month.  He does see [facility] as well.  We will control his diabetes.  Kidney transplanted I have reviewed his notes from the transplant team.  He is on immunosuppressants.  He is to followup with the team as directed.  Hypercholesterolemia He was taken off his atorvastatin where he had elevated liver enzymes where he is on zetia.  Due to his history of CAD and Diabetes his LDL needs to be <50.  We will check a FLP today.  History of prostate cancer His prostate cancer is in remission.  History of anemia due to chronic kidney disease We will check iron studies today per Dr. Adin Hector recommendations.  BMI 26.0-26.9,adult Continue to eat healthy and exercise.   Prevention Health maintenance discussed. We will obtain some yearly labs.  We will hold off on prevnar 13 and shingrix vaccine since his recent tranplantation.   Medicare Attestation I have personally reviewed: The patient's medical and social history Their use of alcohol, tobacco or illicit drugs Their current medications and supplements The patient's functional ability including ADLs,fall risks, home safety risks, cognitive, and hearing and visual impairment Diet and physical activities Evidence for depression or mood disorders  The patient's weight, height, and BMI have been recorded in the chart.  I have made referrals, counseling, and provided education to the patient based on review of the above and I have provided the patient with a written personalized care plan for preventive services.     Townsend Roger, MD   05/16/2022

## 2022-05-16 NOTE — Assessment & Plan Note (Signed)
He had dysphagia with GERD and underwent EGD with capsule endoscopy showing gastritis.  Continue to monitor his CBC and iron counts and continue on his PPI.

## 2022-05-16 NOTE — Addendum Note (Signed)
Addended by: Townsend Roger on: 05/16/2022 11:25 AM   Modules accepted: Orders

## 2022-05-17 DIAGNOSIS — Z9079 Acquired absence of other genital organ(s): Secondary | ICD-10-CM | POA: Diagnosis not present

## 2022-05-17 DIAGNOSIS — Z79624 Long term (current) use of inhibitors of nucleotide synthesis: Secondary | ICD-10-CM | POA: Diagnosis not present

## 2022-05-17 DIAGNOSIS — D696 Thrombocytopenia, unspecified: Secondary | ICD-10-CM | POA: Diagnosis not present

## 2022-05-17 DIAGNOSIS — Z79621 Long term (current) use of calcineurin inhibitor: Secondary | ICD-10-CM | POA: Diagnosis not present

## 2022-05-17 DIAGNOSIS — Z940 Kidney transplant status: Secondary | ICD-10-CM | POA: Diagnosis not present

## 2022-05-17 DIAGNOSIS — I213 ST elevation (STEMI) myocardial infarction of unspecified site: Secondary | ICD-10-CM | POA: Diagnosis not present

## 2022-05-17 DIAGNOSIS — C61 Malignant neoplasm of prostate: Secondary | ICD-10-CM | POA: Diagnosis not present

## 2022-05-17 DIAGNOSIS — I252 Old myocardial infarction: Secondary | ICD-10-CM | POA: Diagnosis not present

## 2022-05-17 DIAGNOSIS — Z8546 Personal history of malignant neoplasm of prostate: Secondary | ICD-10-CM | POA: Diagnosis not present

## 2022-05-17 DIAGNOSIS — D84821 Immunodeficiency due to drugs: Secondary | ICD-10-CM | POA: Diagnosis not present

## 2022-05-17 DIAGNOSIS — E119 Type 2 diabetes mellitus without complications: Secondary | ICD-10-CM | POA: Diagnosis not present

## 2022-05-17 DIAGNOSIS — E785 Hyperlipidemia, unspecified: Secondary | ICD-10-CM | POA: Diagnosis not present

## 2022-05-17 DIAGNOSIS — Z794 Long term (current) use of insulin: Secondary | ICD-10-CM | POA: Diagnosis not present

## 2022-05-17 DIAGNOSIS — Z7952 Long term (current) use of systemic steroids: Secondary | ICD-10-CM | POA: Diagnosis not present

## 2022-05-17 LAB — CBC WITH DIFFERENTIAL/PLATELET
Basophils Absolute: 0 10*3/uL (ref 0.0–0.2)
Basos: 1 %
EOS (ABSOLUTE): 0.1 10*3/uL (ref 0.0–0.4)
Eos: 1 %
Hematocrit: 35.1 % — ABNORMAL LOW (ref 37.5–51.0)
Hemoglobin: 11.9 g/dL — ABNORMAL LOW (ref 13.0–17.7)
Immature Grans (Abs): 0 10*3/uL (ref 0.0–0.1)
Immature Granulocytes: 0 %
Lymphocytes Absolute: 1.5 10*3/uL (ref 0.7–3.1)
Lymphs: 22 %
MCH: 32.4 pg (ref 26.6–33.0)
MCHC: 33.9 g/dL (ref 31.5–35.7)
MCV: 96 fL (ref 79–97)
Monocytes Absolute: 0.4 10*3/uL (ref 0.1–0.9)
Monocytes: 6 %
Neutrophils Absolute: 4.9 10*3/uL (ref 1.4–7.0)
Neutrophils: 70 %
Platelets: 147 10*3/uL — ABNORMAL LOW (ref 150–450)
RBC: 3.67 x10E6/uL — ABNORMAL LOW (ref 4.14–5.80)
RDW: 13.4 % (ref 11.6–15.4)
WBC: 7 10*3/uL (ref 3.4–10.8)

## 2022-05-17 LAB — IRON AND TIBC
Iron Saturation: 15 % (ref 15–55)
Iron: 45 ug/dL (ref 38–169)
Total Iron Binding Capacity: 302 ug/dL (ref 250–450)
UIBC: 257 ug/dL (ref 111–343)

## 2022-05-17 LAB — CMP14 + ANION GAP
ALT: 80 IU/L — ABNORMAL HIGH (ref 0–44)
AST: 48 IU/L — ABNORMAL HIGH (ref 0–40)
Albumin/Globulin Ratio: 1.4 (ref 1.2–2.2)
Albumin: 3.7 g/dL — ABNORMAL LOW (ref 3.9–4.9)
Alkaline Phosphatase: 251 IU/L — ABNORMAL HIGH (ref 44–121)
Anion Gap: 15 mmol/L (ref 10.0–18.0)
BUN/Creatinine Ratio: 12 (ref 10–24)
BUN: 21 mg/dL (ref 8–27)
Bilirubin Total: 0.4 mg/dL (ref 0.0–1.2)
CO2: 21 mmol/L (ref 20–29)
Calcium: 9.2 mg/dL (ref 8.6–10.2)
Chloride: 107 mmol/L — ABNORMAL HIGH (ref 96–106)
Creatinine, Ser: 1.73 mg/dL — ABNORMAL HIGH (ref 0.76–1.27)
Globulin, Total: 2.7 g/dL (ref 1.5–4.5)
Glucose: 81 mg/dL (ref 70–99)
Potassium: 4.3 mmol/L (ref 3.5–5.2)
Sodium: 143 mmol/L (ref 134–144)
Total Protein: 6.4 g/dL (ref 6.0–8.5)
eGFR: 42 mL/min/{1.73_m2} — ABNORMAL LOW (ref >59)

## 2022-05-17 LAB — TSH: TSH: 2.08 u[IU]/mL (ref 0.450–4.500)

## 2022-05-17 LAB — HEMOGLOBIN A1C
Est. average glucose Bld gHb Est-mCnc: 212 mg/dL
Hgb A1c MFr Bld: 9 % — ABNORMAL HIGH (ref 4.8–5.6)

## 2022-05-17 LAB — LIPID PANEL
Chol/HDL Ratio: 3.5 ratio (ref 0.0–5.0)
Cholesterol, Total: 188 mg/dL (ref 100–199)
HDL: 53 mg/dL (ref >39)
LDL Chol Calc (NIH): 120 mg/dL — ABNORMAL HIGH (ref 0–99)
Triglycerides: 83 mg/dL (ref 0–149)
VLDL Cholesterol Cal: 15 mg/dL (ref 5–40)

## 2022-05-17 LAB — PSA: Prostate Specific Ag, Serum: <0.1 ng/mL (ref 0.0–4.0)

## 2022-05-17 LAB — FERRITIN: Ferritin: 49 ng/mL (ref 30–400)

## 2022-05-24 DIAGNOSIS — Z8639 Personal history of other endocrine, nutritional and metabolic disease: Secondary | ICD-10-CM | POA: Diagnosis not present

## 2022-05-24 DIAGNOSIS — D649 Anemia, unspecified: Secondary | ICD-10-CM | POA: Diagnosis not present

## 2022-05-24 DIAGNOSIS — Z8546 Personal history of malignant neoplasm of prostate: Secondary | ICD-10-CM | POA: Diagnosis not present

## 2022-05-24 DIAGNOSIS — Z79624 Long term (current) use of inhibitors of nucleotide synthesis: Secondary | ICD-10-CM | POA: Diagnosis not present

## 2022-05-24 DIAGNOSIS — I252 Old myocardial infarction: Secondary | ICD-10-CM | POA: Diagnosis not present

## 2022-05-24 DIAGNOSIS — Z794 Long term (current) use of insulin: Secondary | ICD-10-CM | POA: Diagnosis not present

## 2022-05-24 DIAGNOSIS — Z792 Long term (current) use of antibiotics: Secondary | ICD-10-CM | POA: Diagnosis not present

## 2022-05-24 DIAGNOSIS — Z7952 Long term (current) use of systemic steroids: Secondary | ICD-10-CM | POA: Diagnosis not present

## 2022-05-24 DIAGNOSIS — Z8719 Personal history of other diseases of the digestive system: Secondary | ICD-10-CM | POA: Diagnosis not present

## 2022-05-24 DIAGNOSIS — Z8674 Personal history of sudden cardiac arrest: Secondary | ICD-10-CM | POA: Diagnosis not present

## 2022-05-24 DIAGNOSIS — E119 Type 2 diabetes mellitus without complications: Secondary | ICD-10-CM | POA: Diagnosis not present

## 2022-05-24 DIAGNOSIS — D696 Thrombocytopenia, unspecified: Secondary | ICD-10-CM | POA: Diagnosis not present

## 2022-05-24 DIAGNOSIS — E782 Mixed hyperlipidemia: Secondary | ICD-10-CM | POA: Diagnosis not present

## 2022-05-24 DIAGNOSIS — Z79621 Long term (current) use of calcineurin inhibitor: Secondary | ICD-10-CM | POA: Diagnosis not present

## 2022-05-24 DIAGNOSIS — Z9079 Acquired absence of other genital organ(s): Secondary | ICD-10-CM | POA: Diagnosis not present

## 2022-05-24 DIAGNOSIS — R7989 Other specified abnormal findings of blood chemistry: Secondary | ICD-10-CM | POA: Diagnosis not present

## 2022-05-24 DIAGNOSIS — I2510 Atherosclerotic heart disease of native coronary artery without angina pectoris: Secondary | ICD-10-CM | POA: Diagnosis not present

## 2022-05-24 DIAGNOSIS — Z955 Presence of coronary angioplasty implant and graft: Secondary | ICD-10-CM | POA: Diagnosis not present

## 2022-05-24 DIAGNOSIS — Z4822 Encounter for aftercare following kidney transplant: Secondary | ICD-10-CM | POA: Diagnosis not present

## 2022-05-25 NOTE — Telephone Encounter (Signed)
Left message for pt to call back.

## 2022-05-31 DIAGNOSIS — Z940 Kidney transplant status: Secondary | ICD-10-CM | POA: Diagnosis not present

## 2022-05-31 DIAGNOSIS — R7989 Other specified abnormal findings of blood chemistry: Secondary | ICD-10-CM | POA: Diagnosis not present

## 2022-05-31 DIAGNOSIS — D849 Immunodeficiency, unspecified: Secondary | ICD-10-CM | POA: Diagnosis not present

## 2022-05-31 DIAGNOSIS — Z79899 Other long term (current) drug therapy: Secondary | ICD-10-CM | POA: Diagnosis not present

## 2022-05-31 DIAGNOSIS — Z5181 Encounter for therapeutic drug level monitoring: Secondary | ICD-10-CM | POA: Diagnosis not present

## 2022-05-31 DIAGNOSIS — E119 Type 2 diabetes mellitus without complications: Secondary | ICD-10-CM | POA: Diagnosis not present

## 2022-05-31 DIAGNOSIS — Z4822 Encounter for aftercare following kidney transplant: Secondary | ICD-10-CM | POA: Diagnosis not present

## 2022-05-31 DIAGNOSIS — E785 Hyperlipidemia, unspecified: Secondary | ICD-10-CM | POA: Diagnosis not present

## 2022-06-03 NOTE — Telephone Encounter (Signed)
Spoke with pt and gave pt results and recommendations. Pt verbalized understanding.

## 2022-06-07 DIAGNOSIS — Z79621 Long term (current) use of calcineurin inhibitor: Secondary | ICD-10-CM | POA: Diagnosis not present

## 2022-06-07 DIAGNOSIS — D696 Thrombocytopenia, unspecified: Secondary | ICD-10-CM | POA: Diagnosis not present

## 2022-06-07 DIAGNOSIS — I10 Essential (primary) hypertension: Secondary | ICD-10-CM | POA: Diagnosis not present

## 2022-06-07 DIAGNOSIS — Z7952 Long term (current) use of systemic steroids: Secondary | ICD-10-CM | POA: Diagnosis not present

## 2022-06-07 DIAGNOSIS — E119 Type 2 diabetes mellitus without complications: Secondary | ICD-10-CM | POA: Diagnosis not present

## 2022-06-07 DIAGNOSIS — Z79620 Long term (current) use of immunosuppressive biologic: Secondary | ICD-10-CM | POA: Diagnosis not present

## 2022-06-07 DIAGNOSIS — E785 Hyperlipidemia, unspecified: Secondary | ICD-10-CM | POA: Diagnosis not present

## 2022-06-07 DIAGNOSIS — Z8546 Personal history of malignant neoplasm of prostate: Secondary | ICD-10-CM | POA: Diagnosis not present

## 2022-06-07 DIAGNOSIS — Z4822 Encounter for aftercare following kidney transplant: Secondary | ICD-10-CM | POA: Diagnosis not present

## 2022-06-07 DIAGNOSIS — R7989 Other specified abnormal findings of blood chemistry: Secondary | ICD-10-CM | POA: Diagnosis not present

## 2022-06-07 DIAGNOSIS — I2510 Atherosclerotic heart disease of native coronary artery without angina pectoris: Secondary | ICD-10-CM | POA: Diagnosis not present

## 2022-06-07 DIAGNOSIS — Z792 Long term (current) use of antibiotics: Secondary | ICD-10-CM | POA: Diagnosis not present

## 2022-06-09 ENCOUNTER — Other Ambulatory Visit: Payer: Self-pay

## 2022-06-14 DIAGNOSIS — Z79624 Long term (current) use of inhibitors of nucleotide synthesis: Secondary | ICD-10-CM | POA: Diagnosis not present

## 2022-06-14 DIAGNOSIS — E119 Type 2 diabetes mellitus without complications: Secondary | ICD-10-CM | POA: Diagnosis not present

## 2022-06-14 DIAGNOSIS — Z79899 Other long term (current) drug therapy: Secondary | ICD-10-CM | POA: Diagnosis not present

## 2022-06-14 DIAGNOSIS — Z8674 Personal history of sudden cardiac arrest: Secondary | ICD-10-CM | POA: Diagnosis not present

## 2022-06-14 DIAGNOSIS — Z8719 Personal history of other diseases of the digestive system: Secondary | ICD-10-CM | POA: Diagnosis not present

## 2022-06-14 DIAGNOSIS — Z4822 Encounter for aftercare following kidney transplant: Secondary | ICD-10-CM | POA: Diagnosis not present

## 2022-06-14 DIAGNOSIS — Z940 Kidney transplant status: Secondary | ICD-10-CM | POA: Diagnosis not present

## 2022-06-14 DIAGNOSIS — Z9079 Acquired absence of other genital organ(s): Secondary | ICD-10-CM | POA: Diagnosis not present

## 2022-06-14 DIAGNOSIS — Z7952 Long term (current) use of systemic steroids: Secondary | ICD-10-CM | POA: Diagnosis not present

## 2022-06-14 DIAGNOSIS — E785 Hyperlipidemia, unspecified: Secondary | ICD-10-CM | POA: Diagnosis not present

## 2022-06-14 DIAGNOSIS — R7989 Other specified abnormal findings of blood chemistry: Secondary | ICD-10-CM | POA: Diagnosis not present

## 2022-06-14 DIAGNOSIS — D8989 Other specified disorders involving the immune mechanism, not elsewhere classified: Secondary | ICD-10-CM | POA: Diagnosis not present

## 2022-06-14 DIAGNOSIS — Z7982 Long term (current) use of aspirin: Secondary | ICD-10-CM | POA: Diagnosis not present

## 2022-06-14 DIAGNOSIS — Z792 Long term (current) use of antibiotics: Secondary | ICD-10-CM | POA: Diagnosis not present

## 2022-06-14 DIAGNOSIS — D3000 Benign neoplasm of unspecified kidney: Secondary | ICD-10-CM | POA: Diagnosis not present

## 2022-06-14 DIAGNOSIS — D649 Anemia, unspecified: Secondary | ICD-10-CM | POA: Diagnosis not present

## 2022-06-14 DIAGNOSIS — Z79621 Long term (current) use of calcineurin inhibitor: Secondary | ICD-10-CM | POA: Diagnosis not present

## 2022-06-14 DIAGNOSIS — I10 Essential (primary) hypertension: Secondary | ICD-10-CM | POA: Diagnosis not present

## 2022-06-14 DIAGNOSIS — D696 Thrombocytopenia, unspecified: Secondary | ICD-10-CM | POA: Diagnosis not present

## 2022-06-14 DIAGNOSIS — Z794 Long term (current) use of insulin: Secondary | ICD-10-CM | POA: Diagnosis not present

## 2022-06-14 DIAGNOSIS — I252 Old myocardial infarction: Secondary | ICD-10-CM | POA: Diagnosis not present

## 2022-06-14 DIAGNOSIS — Z8546 Personal history of malignant neoplasm of prostate: Secondary | ICD-10-CM | POA: Diagnosis not present

## 2022-06-14 DIAGNOSIS — Z955 Presence of coronary angioplasty implant and graft: Secondary | ICD-10-CM | POA: Diagnosis not present

## 2022-06-14 DIAGNOSIS — I2510 Atherosclerotic heart disease of native coronary artery without angina pectoris: Secondary | ICD-10-CM | POA: Diagnosis not present

## 2022-06-16 DIAGNOSIS — E1122 Type 2 diabetes mellitus with diabetic chronic kidney disease: Secondary | ICD-10-CM | POA: Diagnosis not present

## 2022-06-28 ENCOUNTER — Encounter: Payer: Self-pay | Admitting: Gastroenterology

## 2022-06-28 DIAGNOSIS — E119 Type 2 diabetes mellitus without complications: Secondary | ICD-10-CM | POA: Diagnosis not present

## 2022-06-28 DIAGNOSIS — Z79624 Long term (current) use of inhibitors of nucleotide synthesis: Secondary | ICD-10-CM | POA: Diagnosis not present

## 2022-06-28 DIAGNOSIS — Z7952 Long term (current) use of systemic steroids: Secondary | ICD-10-CM | POA: Diagnosis not present

## 2022-06-28 DIAGNOSIS — Z5181 Encounter for therapeutic drug level monitoring: Secondary | ICD-10-CM | POA: Diagnosis not present

## 2022-06-28 DIAGNOSIS — D696 Thrombocytopenia, unspecified: Secondary | ICD-10-CM | POA: Diagnosis not present

## 2022-06-28 DIAGNOSIS — Z4822 Encounter for aftercare following kidney transplant: Secondary | ICD-10-CM | POA: Diagnosis not present

## 2022-06-28 DIAGNOSIS — Z8546 Personal history of malignant neoplasm of prostate: Secondary | ICD-10-CM | POA: Diagnosis not present

## 2022-06-28 DIAGNOSIS — I252 Old myocardial infarction: Secondary | ICD-10-CM | POA: Diagnosis not present

## 2022-06-28 DIAGNOSIS — D649 Anemia, unspecified: Secondary | ICD-10-CM | POA: Diagnosis not present

## 2022-06-28 DIAGNOSIS — Z79899 Other long term (current) drug therapy: Secondary | ICD-10-CM | POA: Diagnosis not present

## 2022-06-28 DIAGNOSIS — Z79621 Long term (current) use of calcineurin inhibitor: Secondary | ICD-10-CM | POA: Diagnosis not present

## 2022-06-28 DIAGNOSIS — Z9079 Acquired absence of other genital organ(s): Secondary | ICD-10-CM | POA: Diagnosis not present

## 2022-06-28 DIAGNOSIS — E785 Hyperlipidemia, unspecified: Secondary | ICD-10-CM | POA: Diagnosis not present

## 2022-06-28 DIAGNOSIS — Z792 Long term (current) use of antibiotics: Secondary | ICD-10-CM | POA: Diagnosis not present

## 2022-06-28 DIAGNOSIS — D849 Immunodeficiency, unspecified: Secondary | ICD-10-CM | POA: Diagnosis not present

## 2022-06-28 DIAGNOSIS — Z794 Long term (current) use of insulin: Secondary | ICD-10-CM | POA: Diagnosis not present

## 2022-06-28 DIAGNOSIS — I951 Orthostatic hypotension: Secondary | ICD-10-CM | POA: Diagnosis not present

## 2022-06-28 DIAGNOSIS — Z940 Kidney transplant status: Secondary | ICD-10-CM | POA: Diagnosis not present

## 2022-06-28 DIAGNOSIS — Z7982 Long term (current) use of aspirin: Secondary | ICD-10-CM | POA: Diagnosis not present

## 2022-06-28 DIAGNOSIS — E1122 Type 2 diabetes mellitus with diabetic chronic kidney disease: Secondary | ICD-10-CM | POA: Diagnosis not present

## 2022-06-28 DIAGNOSIS — I25118 Atherosclerotic heart disease of native coronary artery with other forms of angina pectoris: Secondary | ICD-10-CM | POA: Diagnosis not present

## 2022-06-28 DIAGNOSIS — R7989 Other specified abnormal findings of blood chemistry: Secondary | ICD-10-CM | POA: Diagnosis not present

## 2022-06-28 DIAGNOSIS — Z955 Presence of coronary angioplasty implant and graft: Secondary | ICD-10-CM | POA: Diagnosis not present

## 2022-07-04 ENCOUNTER — Encounter: Payer: Self-pay | Admitting: Gastroenterology

## 2022-07-05 DIAGNOSIS — E1165 Type 2 diabetes mellitus with hyperglycemia: Secondary | ICD-10-CM | POA: Diagnosis not present

## 2022-07-05 DIAGNOSIS — N1832 Chronic kidney disease, stage 3b: Secondary | ICD-10-CM | POA: Diagnosis not present

## 2022-07-05 DIAGNOSIS — I129 Hypertensive chronic kidney disease with stage 1 through stage 4 chronic kidney disease, or unspecified chronic kidney disease: Secondary | ICD-10-CM | POA: Diagnosis not present

## 2022-07-05 DIAGNOSIS — E1122 Type 2 diabetes mellitus with diabetic chronic kidney disease: Secondary | ICD-10-CM | POA: Diagnosis not present

## 2022-07-06 DIAGNOSIS — E1122 Type 2 diabetes mellitus with diabetic chronic kidney disease: Secondary | ICD-10-CM | POA: Diagnosis not present

## 2022-07-06 DIAGNOSIS — Z794 Long term (current) use of insulin: Secondary | ICD-10-CM | POA: Diagnosis not present

## 2022-07-06 DIAGNOSIS — N184 Chronic kidney disease, stage 4 (severe): Secondary | ICD-10-CM | POA: Diagnosis not present

## 2022-07-06 DIAGNOSIS — E782 Mixed hyperlipidemia: Secondary | ICD-10-CM | POA: Diagnosis not present

## 2022-07-06 DIAGNOSIS — I129 Hypertensive chronic kidney disease with stage 1 through stage 4 chronic kidney disease, or unspecified chronic kidney disease: Secondary | ICD-10-CM | POA: Diagnosis not present

## 2022-07-12 DIAGNOSIS — D849 Immunodeficiency, unspecified: Secondary | ICD-10-CM | POA: Diagnosis not present

## 2022-07-12 DIAGNOSIS — E119 Type 2 diabetes mellitus without complications: Secondary | ICD-10-CM | POA: Diagnosis not present

## 2022-07-12 DIAGNOSIS — Z940 Kidney transplant status: Secondary | ICD-10-CM | POA: Diagnosis not present

## 2022-07-12 DIAGNOSIS — I10 Essential (primary) hypertension: Secondary | ICD-10-CM | POA: Diagnosis not present

## 2022-07-12 DIAGNOSIS — Z794 Long term (current) use of insulin: Secondary | ICD-10-CM | POA: Diagnosis not present

## 2022-07-12 DIAGNOSIS — E1122 Type 2 diabetes mellitus with diabetic chronic kidney disease: Secondary | ICD-10-CM | POA: Diagnosis not present

## 2022-07-12 DIAGNOSIS — Z79899 Other long term (current) drug therapy: Secondary | ICD-10-CM | POA: Diagnosis not present

## 2022-07-15 DIAGNOSIS — E1122 Type 2 diabetes mellitus with diabetic chronic kidney disease: Secondary | ICD-10-CM | POA: Diagnosis not present

## 2022-08-09 DIAGNOSIS — Z940 Kidney transplant status: Secondary | ICD-10-CM | POA: Diagnosis not present

## 2022-08-09 DIAGNOSIS — Z79899 Other long term (current) drug therapy: Secondary | ICD-10-CM | POA: Diagnosis not present

## 2022-08-12 DIAGNOSIS — E1122 Type 2 diabetes mellitus with diabetic chronic kidney disease: Secondary | ICD-10-CM | POA: Diagnosis not present

## 2022-08-15 ENCOUNTER — Ambulatory Visit: Payer: Medicare Other | Admitting: Internal Medicine

## 2022-08-15 ENCOUNTER — Encounter: Payer: Self-pay | Admitting: Internal Medicine

## 2022-08-15 VITALS — BP 146/86 | HR 84 | Temp 97.7°F | Resp 16 | Ht 70.0 in | Wt 189.0 lb

## 2022-08-15 DIAGNOSIS — E1122 Type 2 diabetes mellitus with diabetic chronic kidney disease: Secondary | ICD-10-CM | POA: Diagnosis not present

## 2022-08-15 DIAGNOSIS — I10 Essential (primary) hypertension: Secondary | ICD-10-CM | POA: Diagnosis not present

## 2022-08-15 DIAGNOSIS — Z940 Kidney transplant status: Secondary | ICD-10-CM | POA: Diagnosis not present

## 2022-08-15 DIAGNOSIS — E1121 Type 2 diabetes mellitus with diabetic nephropathy: Secondary | ICD-10-CM

## 2022-08-15 NOTE — Progress Notes (Signed)
Office Visit  Subjective   Patient ID: Jason Zimmerman   DOB: 08/06/1955   Age: 67 y.o.   MRN: [id number]   Chief Complaint Chief Complaint  Patient presents with   Follow-up    Diabetes     History of Present Illness The patient is a 67 year old Caucasian/White male who returns for a follow-up visit for his diabetes. He was diagnosed with diabetes in 2004.  We have not seen the patient in several years but he states over the interim, he did have a kidney transplant in 04/2022.  His diabetes has been followed by his endocrinologist where he just saw him on 07/2022.  They started him this past month on ozempic 0.25mg  and he took his first dose last week.  He remains lantus 48 Units daily, lyumjev sliding scale where he normally takes 16 Units about 3 times per day and ozempic 0.25mg  subcut weekly.  He is not walking as much as they would like.  He specifically denies unexplained abdominal pain, nausea or vomiting, or documented hypoglycemia. He uses the [employer] CGM  and his sugars range somewhere between 100-200's.  His last HgbA1c was done on 05/16/2022 and was 9%. He does have long term complications of CAD/PVD, diabetic retinopathy and diabetic nephropathy but no diabetic neuropathy.  His last dilated eye exam by Dr. Luciana Axe in [place] was done on 10/25/2021 where they noted his right eye had moderate proliferative reinoatphy and macular edema.  They felt this was stable and wanted him to return in 1 year.      The patient has a history of ESRD secondary to T2 diabetes and HTN where he underwent renal transplatation in 04/10/2022.  His last visit with the transplant team was in 08/09/2022.  His creatine was 1.93 at that time and his tranplant was stable.  He remains on prednisone  daily and prograf.  His neprhologist is Dr. Sabra Heck at [facility].  The patient avoids NSAIDS on questioning today.   The patient is a 67 year old Caucasian/White male who presents for a follow-up  evaluation of hypertension.  His BP was mildly elevated on his last visit.   The patient has not been checking his blood pressure at home. The patient's current medications include: coreg 3.125mg  po BID. The patient has been tolerating his medications well. The patient denies any headache, visual changes, dizziness, lightheadness, chest pain, shortness of breath, weakness/numbness, and edema. He reports there have been no other symptoms noted.   The patient is a 67 year old Caucasian/White male who presents for a follow-up evaluation of hypertension. The patient has been checking his blood pressure at home.  He states his systolic BP runs in the 130-140's.  The patient's current medications include: coreg 3.125mg  po BID. The patient has been tolerating his medications well. The patient denies any headache, visual changes, dizziness, lightheadness, chest pain, shortness of breath, weakness/numbness, and edema. He reports there have been no other symptoms noted.                      Past Medical History Past Medical History:  Diagnosis Date   Anemia associated with chronic renal failure    CKD (chronic kidney disease), stage V    Coronary disease    Diabetic nephropathy associated with type 2 diabetes mellitus    Diabetic retinopathy    Fatty liver    per patient's wife   GERD (gastroesophageal reflux disease) 03/14/2022   no  meds   Heart attack 10/19/2010   History of prostate cancer    Hyperlipidemia    Hypertension, renal    Ocular hypertension    Prostate cancer 2011   RTA (renal tubular acidosis)    Secondary hyperparathyroidism, renal    Vitamin D deficiency      Allergies Allergies  Allergen Reactions   Cefazolin Anaphylaxis    Administered for surgical prophylaxis along with platelets and pt subsequently had presumed allergic reaction with hypotension, tachycardia elevate peak pressures     Medications  Current Outpatient Medications:    OZEMPIC, 0.25 OR 0.5  MG/DOSE, 2 MG/3ML SOPN, Inject 0.25 mg into the skin once a week., Disp: , Rfl:    acetaminophen (TYLENOL) 500 MG tablet, Take 1,000 mg by mouth every 6 (six) hours as needed (pain.)., Disp: , Rfl:    aspirin EC 81 MG tablet, Take 81 mg by mouth at bedtime., Disp: , Rfl:    ezetimibe (ZETIA) 10 MG tablet, Take 10 mg by mouth in the morning., Disp: , Rfl:    glucose blood (FREESTYLE TEST STRIPS) test strip, 1 each by Other route 2 (two) times daily as needed for other. PRN, Disp: 102 each, Rfl: 5   insulin glargine (LANTUS) 100 UNIT/ML Solostar Pen, Inject 48 Units into the skin at bedtime., Disp: , Rfl:    LYUMJEV KWIKPEN 100 UNIT/ML KwikPen, Inject into the skin., Disp: , Rfl:    mycophenolate (MYFORTIC) 180 MG EC tablet, Take 360 mg by mouth 2 (two) times daily., Disp: , Rfl:    nitroGLYCERIN (NITROSTAT) 0.4 MG SL tablet, Place 0.4 mg under the tongue every 5 (five) minutes x 3 doses as needed for chest pain., Disp: , Rfl:    omeprazole (PRILOSEC) 20 MG capsule, Take 20 mg by mouth daily., Disp: , Rfl:    predniSONE (DELTASONE) 5 MG tablet, Take 5 mg by mouth daily with breakfast., Disp: , Rfl:    tacrolimus (PROGRAF) 1 MG capsule, Take 1.5 mg by mouth 2 (two) times daily., Disp: , Rfl:    timolol (TIMOPTIC) 0.5 % ophthalmic solution, Place 1 drop into the left eye 2 (two) times daily., Disp: 10 mL, Rfl: 2   Review of Systems Review of Systems  Constitutional:  Negative for chills and fever.  Eyes:  Negative for blurred vision and double vision.  Respiratory:  Negative for cough and shortness of breath.   Cardiovascular:  Negative for chest pain, palpitations and leg swelling.  Gastrointestinal:  Negative for abdominal pain, constipation, diarrhea, nausea and vomiting.  Genitourinary:  Negative for frequency, hematuria and urgency.  Musculoskeletal:  Negative for myalgias.  Skin:  Negative for itching and rash.  Neurological:  Negative for dizziness, weakness and headaches.   Psychiatric/Behavioral:  Negative for depression. The patient is not nervous/anxious.        Objective:    Vitals BP (!) 146/86   Pulse 84   Temp 97.7 F (36.5 C)   Resp 16   Ht  (1.778 m)   Wt 189 lb (85.7 kg)   SpO2 99%   BMI 27.12 kg/m    Physical Examination Physical Exam Constitutional:      Appearance: Normal appearance. He is not ill-appearing.  Cardiovascular:     Rate and Rhythm: Normal rate and regular rhythm.     Pulses: Normal pulses.     Heart sounds: No murmur heard.    No friction rub. No gallop.  Pulmonary:     Effort: Pulmonary effort  is normal. No respiratory distress.     Breath sounds: No wheezing, rhonchi or rales.  Abdominal:     General: Abdomen is flat. Bowel sounds are normal. There is no distension.     Palpations: Abdomen is soft.     Tenderness: There is no abdominal tenderness.  Musculoskeletal:     Right lower leg: No edema.     Left lower leg: No edema.  Skin:    General: Skin is warm and dry.     Findings: No rash.  Neurological:     General: No focal deficit present.     Mental Status: He is alert and oriented to person, place, and time.  Psychiatric:        Mood and Affect: Mood normal.        Behavior: Behavior normal.        Assessment & Plan:   Essential hypertension His BP is a bit elevated but his BP was controlled at other visits on review of his EPIC chart.  We will see what his BP is doing on his next visit.  Diabetic nephropathy associated with type 2 diabetes mellitus (HCC) I am going to hold off on doing a HgBA1c since he just started the ozempic 1 week ago.  He follows up with endo in 10/2022 and they will probably check his HgBA1c at that time.  I want him to remain compliant with his meds and exercise and watch his diet.  Kidney transplanted He has CKD where they transplant team is following.  His last creatinine in their clinic was 1.93.  His HgB was 11.9.  I am going to see him back in 3 months for a  yearly exam.    Return in about 3 months (around 11/14/2022).   Crist Fat, MD

## 2022-08-15 NOTE — Assessment & Plan Note (Signed)
His BP is a bit elevated but his BP was controlled at other visits on review of his EPIC chart.  We will see what his BP is doing on his next visit.

## 2022-08-15 NOTE — Assessment & Plan Note (Signed)
I am going to hold off on doing a HgBA1c since he just started the ozempic 1 week ago.  He follows up with endo in 10/2022 and they will probably check his HgBA1c at that time.  I want him to remain compliant with his meds and exercise and watch his diet.

## 2022-08-15 NOTE — Assessment & Plan Note (Signed)
He has CKD where they transplant team is following.  His last creatinine in their clinic was 1.93.  His HgB was 11.9.  I am going to see him back in 3 months for a yearly exam.

## 2022-09-06 DIAGNOSIS — Z940 Kidney transplant status: Secondary | ICD-10-CM | POA: Diagnosis not present

## 2022-09-06 DIAGNOSIS — E1122 Type 2 diabetes mellitus with diabetic chronic kidney disease: Secondary | ICD-10-CM | POA: Diagnosis not present

## 2022-09-11 DIAGNOSIS — E1122 Type 2 diabetes mellitus with diabetic chronic kidney disease: Secondary | ICD-10-CM | POA: Diagnosis not present

## 2022-09-14 DIAGNOSIS — E1122 Type 2 diabetes mellitus with diabetic chronic kidney disease: Secondary | ICD-10-CM | POA: Diagnosis not present

## 2022-09-21 ENCOUNTER — Telehealth: Payer: Self-pay

## 2022-09-21 NOTE — Progress Notes (Signed)
[facility] Follow-Up Chart Prep  Start: 09/21/22, 9:26 AM. Reviewed chart/multiple consult notes/labs/vitals/medications/TEs to complete chart prep for pt's [facility] follow-up office visit. Documented many medication changes. Pt is scheduled to see Lynann Bologna, PharmD on 09/23/22. Total time: 79 minutes (non-billable time). Anne Ng, LPN.  1/61/09 - Pt confirmed his appt date/time/format. - JHM, LPN.  [birth year] - Pt no-showed. - JHM, LPN.  0/98/11 -  Spoke with pt. He did not realize that he had missed his appt; he stated that he thought it was on a different day. He apologized. He stated that he cannot r/s his appt on a Friday or Thursday due to his work schedule. He asked if we could call him on a Tuesday or Wednesday instead. He requested a morning appt. I scheduled a f/u TV on 10/12/22 at 9:00am. - JHM, LPN.

## 2022-09-27 DIAGNOSIS — E1122 Type 2 diabetes mellitus with diabetic chronic kidney disease: Secondary | ICD-10-CM | POA: Diagnosis not present

## 2022-09-27 DIAGNOSIS — E1165 Type 2 diabetes mellitus with hyperglycemia: Secondary | ICD-10-CM | POA: Diagnosis not present

## 2022-09-27 DIAGNOSIS — I129 Hypertensive chronic kidney disease with stage 1 through stage 4 chronic kidney disease, or unspecified chronic kidney disease: Secondary | ICD-10-CM | POA: Diagnosis not present

## 2022-09-27 DIAGNOSIS — N1832 Chronic kidney disease, stage 3b: Secondary | ICD-10-CM | POA: Diagnosis not present

## 2022-10-04 DIAGNOSIS — Z8546 Personal history of malignant neoplasm of prostate: Secondary | ICD-10-CM | POA: Diagnosis not present

## 2022-10-04 DIAGNOSIS — I252 Old myocardial infarction: Secondary | ICD-10-CM | POA: Diagnosis not present

## 2022-10-04 DIAGNOSIS — Z4822 Encounter for aftercare following kidney transplant: Secondary | ICD-10-CM | POA: Diagnosis not present

## 2022-10-04 DIAGNOSIS — E1122 Type 2 diabetes mellitus with diabetic chronic kidney disease: Secondary | ICD-10-CM | POA: Diagnosis not present

## 2022-10-04 DIAGNOSIS — D849 Immunodeficiency, unspecified: Secondary | ICD-10-CM | POA: Diagnosis not present

## 2022-10-04 DIAGNOSIS — E782 Mixed hyperlipidemia: Secondary | ICD-10-CM | POA: Diagnosis not present

## 2022-10-04 DIAGNOSIS — Z79621 Long term (current) use of calcineurin inhibitor: Secondary | ICD-10-CM | POA: Diagnosis not present

## 2022-10-04 DIAGNOSIS — Z794 Long term (current) use of insulin: Secondary | ICD-10-CM | POA: Diagnosis not present

## 2022-10-04 DIAGNOSIS — Z5181 Encounter for therapeutic drug level monitoring: Secondary | ICD-10-CM | POA: Diagnosis not present

## 2022-10-04 DIAGNOSIS — N184 Chronic kidney disease, stage 4 (severe): Secondary | ICD-10-CM | POA: Diagnosis not present

## 2022-10-04 DIAGNOSIS — Z940 Kidney transplant status: Secondary | ICD-10-CM | POA: Diagnosis not present

## 2022-10-04 DIAGNOSIS — I951 Orthostatic hypotension: Secondary | ICD-10-CM | POA: Diagnosis not present

## 2022-10-05 ENCOUNTER — Encounter (HOSPITAL_COMMUNITY)
Admission: RE | Disposition: A | Discharge status: Home / Self Care | Payer: Self-pay | Source: Home / Self Care | Attending: Gastroenterology

## 2022-10-05 ENCOUNTER — Ambulatory Visit (HOSPITAL_COMMUNITY)
Admission: RE | Admit: 2022-10-05 | Discharge: 2022-10-05 | Disposition: A | Discharge status: Home / Self Care | Payer: Medicare Other | Attending: Gastroenterology | Admitting: Gastroenterology

## 2022-10-05 DIAGNOSIS — R1310 Dysphagia, unspecified: Secondary | ICD-10-CM | POA: Diagnosis not present

## 2022-10-05 DIAGNOSIS — K219 Gastro-esophageal reflux disease without esophagitis: Secondary | ICD-10-CM | POA: Insufficient documentation

## 2022-10-05 HISTORY — PX: ESOPHAGEAL MANOMETRY: SHX5429

## 2022-10-05 SURGERY — MANOMETRY, ESOPHAGUS

## 2022-10-05 MED ORDER — LIDOCAINE VISCOUS HCL 2 % MT SOLN
OROMUCOSAL | Status: AC
  Filled 2022-10-05: qty 15

## 2022-10-05 SURGICAL SUPPLY — 2 items
FACESHIELD LNG OPTICON STERILE (SAFETY) IMPLANT
GLOVE BIO SURGEON STRL SZ8 (GLOVE) ×4 IMPLANT

## 2022-10-05 NOTE — Progress Notes (Signed)
Esophageal manometry performed per protocol without complications.  Patient tolerated well. 

## 2022-10-09 ENCOUNTER — Encounter (HOSPITAL_COMMUNITY): Payer: Self-pay | Admitting: Gastroenterology

## 2022-10-12 ENCOUNTER — Telehealth: Payer: Self-pay

## 2022-10-12 DIAGNOSIS — E1122 Type 2 diabetes mellitus with diabetic chronic kidney disease: Secondary | ICD-10-CM | POA: Diagnosis not present

## 2022-10-12 NOTE — Telephone Encounter (Signed)
Follow Up Pharmacist Visit ([facility])  Jason Zimmerman,Jason Zimmerman  66 years, Male  DOB: Dec 17, 1955  M: [phone]  __________________________________________________ Clinical Summary Next Pharmacist Follow Up: FPO every 3 months  Next AWV: 11/08/22 Summary for PCP:  - patient is working on his diet and bringing A1C under control. - Recommend patient to monitor BP at home and log them for checking in the office. . Patient Chart Prep (HC) Chronic Conditions Patient's Chronic Conditions: Diabetes (DM), Chronic Kidney Disease (CKD), Gastroesophageal Reflux Disease (GERD), Other, Hypertension (HTN) List Other Conditions (separated by comma): CAD, Anemia d/t CKD, Non-Alcoholic Fatty Liver Doctor and Hospital Visits Were there PCP Visits since last visit with the Pharmacist?: Yes PCP Visits details: 05/16/22, Dr. Leonia Reader. OV for non-alcoholic fatty liver. Notes indicate that Atorvastatin had been discontinued due to pt's elevated LFTs.  08/15/22, Dr. Leonia Reader. Notes indicate that pt was taking 48U of Lantus QD. Pt was "not walking as much as they would like". Home glucose readings: 100-200s. Postponed drawing A1c; pt had started Ozempic 1 week prior. Were there Specialist Visits since last visit with the Pharmacist?: Yes Specialist Visits details: 03/31/22, Vito Cirigliano (GI). OV for capsule endoscopy. No med changes.  03/29/22, [place]. Planned admission for L arm AV fistua/graft creation. No known med changes.  Dr. Shaune Spittle and Dr. Jobe Gibbon (transplant clinic). F/u s/p transplant. No med changes.  04/18/22, Dr. Garnet Sierras (transplant clinic). Follow-up. Fasting glucose: 211-260mg /dL. Increased Lantus from 35U to 40U QD.  04/20/22, Amber Reeves-Daniel, DO (transplant clinic). Follow-up. Decreased Prednisone to 10mg  QD due to hyperglycemia.  04/22/22, 04/26/22, 04/29/22, 05/05/22, 06/14/22, 06/28/22, 07/12/22, Eliezer Bottom, PA and Nickie Retort, PA (transplant, [facility]). OVs  to discuss immunosuppression. Notes are not available.  05/03/22, Dr. Mauricio Po (transplant, [facility]). F/u. Notes are unavailable.  05/05/22, Dr. Odella Aquas (urologist). OV for cystoscopy and JJ ureteral stent removal. No med changes.  05/10/22, Luiz Blare, PA (transplant, [facility]). Follow-up. No known med changes.  05/13/22, Vito Cirigliano. Follow-up. Pt c/o intermittent dysphagia with regurgitation. Planned to re-evaluate liver function in 3-4 months after pt had recovered from the renal transplant. Scheduled esophageal manometry. Notes indicate that pt would start taking iron.  05/17/22, Latanya Maudlin (transplant, [facility]). Fasting glucose readings: 91-168mg /dL. Post-prandial readings: 153-205mg /dL. Notes indicate that Atorvastatin, Bactrim, and Fluconazole were on hold due to elevated LFTs. Restarted Bactrim M-W-F-S-Sun due to improvement in LFTs.  05/24/22, Arman Bogus, PA (transplant, [facility]). Prescribed Prednisone 5mg  QD. Ozempic was not covered by pt's insurance. He declined starting a GLP1; "he would prefer to continue on insulin and work on dietary changes."  05/31/22, Dr. Candee Furbish (transplant, [facility]). F/u. No med changes.  06/07/22, Constance Haw, PharmD/CPP (transplant, [facility]). Average home glucose: 260mg /dL. Someone had called pt and stated that Ozempic was now covered by insurance. He did not want to start using Ozempic at that time. Increased Lantus to 48U QD.  07/06/22, Dr. Izell  (endocrinologist). Pt was taking Lantus 50U QD, 16U of Humalog with breakfast and lunch, and 18U of Humalog with dinner. Trulicity was not covered by [employer]. Glucose readings fluctuated between 60-400mg /dL. Elevated readings in the afternoon/evening were attributed to Prednisone. A1c: 10.4. Prescribed Ozempic 0.25mg  once weekly, increasing to 0.5mg  after 4 weeks. Discontinued Humalog and prescribed Lyumjev (insurance preference). Decreased Lantus to 44U QD and provided a Lyumjev sliding scale. Upgraded from  Emet 2 to Erskine 3.  08/09/22, Constance Haw, CPP (transplant, [facility]). Follow-up. BP was "suboptimally controlled", with average home BP readings in the  130s/70s. Some readings were up to the 150s/90s. Restarted Carvedilol 3.125mg  BID. Advised pt to take half of his mealtime insulin he if does not eat a full meal.  09/06/22, Dr. Jefm Miles (general surgery). Notes are unavailable at this time. Was there a Hospital Visit in last 30 days?: No Were there other Hospital Visits since last visit with the Pharmacist?: Yes Hospital Visits details: 04/09/22-04/14/22, [facility]. Planned admission for renal transplant. Prescribed Ciprofloxacin 500mg  QD, Fluconazole 50mg  QD, Furosemide 40mg  QD, Insulin Lispro 10U TID, Mycophenolate Sodium 180mg  EC 360mg  2 tabs BID, Omeprazole 20mg  DR QD, Miralax 17G QD PRN, Prednisone 5mg  4 tabs QD, Senna-Docusate 8.6-50mg  BID PRN, Sulfamethoxazole-Tri. 400-80mg  QD M-W-F, Tacrolimus 1mg  2 caps BID, Valganciclovir 450mg  M-W-F. Changed Lantus to 40U QD. Discontinued Carvedilol 3.125mg , Trulicity 0.75mg /0.96mL, Pioglitazone 45mg , Sodium Bicarbonate 650mg , B Complex Vitamins, and Albuterol 68mcg/ACT. Engagement Notes Jason Zimmerman on 09/21/2022 10:42 AM Previous AWV: 05/16/22. Previous Colonoscopy: 02/13/2018. Previous DEE: 10/26/2022 (DUE SOON - h/o retinopathy). Previous Microalbumin: 12/27/19 (OVERDUE). Jason Zimmerman on 09/21/2022 10:37 AM Most recent EMR med list:  Ezetimibe 10mg  QD. Lantus 100UT/mL 48U QD. Lyumjev 100U/mL per sliding scale TID. Ozempic 0.25mg  once weekly. Mycophenolate 180mg  EC 2 caps BID. Tacrolimus 1mg  1.5mg  BID. Prednisone 5mg  QD. Omeprazole 20mg  QD. Aspirin 81mg  QD. Timolol 0.5% 1 drop L eye BID.  Acetaminophen 500mg  2 tabs QID PRN. Nitroglycerin 0.4mg  SL PRN. Jason Zimmerman on 09/02/2022 12:14 PM 09/23/2022 Pre-Call Questions (HC) Pre-Call Questions Date:: 09/22/2022 Time:: PM Outcome:: Successful Confirmed appointment date/time with  patient/caregiver?: Yes Date/time of the appointment: 09/23/22, 1:30pm. Visit type: Clinic What is your top health concern to discuss at your upcoming visit?: No specific concerns. 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 Lifestyle habits: Diet: He has libre 3 on him now. He is trying to control the diet. He has been drinking enough water for the most part. He drinks 1.5 to 2 liters. Since transplant, he was put on ozempic 2 months ago. He now tries to eat 3 meals a day. He is down to 174lbs now with Ozempic and transplant. Food cut off is 7pm.  Exercise: with the girls team coaching.  Sleep: sleep around 1pm. He and his wife are both night owls. He gets 6hrs a night. May take a powernap for SDOH: Accountable Health Communities Health-Related Social Needs Screening Tool ([employer]) SDOH questions were documented in [employer] within the past 12 months or since hospitalization?: Yes Medication Adherence Does the [facility] have access to medication refill history?: No Hypertension (HTN) Most Recent BP: 146/86 Most Recent HR: 84 taken on: 08/15/2022 Care Gap: Need BP documented or last BP 140/90 or higher: Needs to be addressed Assessed today?: Yes BP today is: unable to obtain Goal: <130/80 mmHG Is Patient checking BP at home?: Yes Has patient experienced hypotension, dizziness, falls or bradycardia?: No Assessment:: Uncontrolled Drug: Carvedilol 3.125mg  BID Pharmacist Assessment: Appropriate, Query Effectiveness Diabetes (DM) Most recent A1C: 10.9 taken on: 09/06/2022 Most Recent GFR: 29 taken on: 09/06/2022 Type: 2 Most recent microalbumin ratio: 258.70 tested on: 12/27/2019 Care Gap: Statin therapy needed: Needs to be addressed Care Gap: Need A1c documented or last A1c > 9 %: Needs  to be addressed Care Gap: Need eye exam documented in EMR or by claim: Needs to be addressed Care Gap: Need eGFR and uACR for kidney health evaluation: Needs to be addressed Assessed today?: Yes Goal A1C: <  7.0 % Type: 2 Is Patient taking statin medication: Yes Has patient experienced any hypoglycemic episodes?: sometimes overnight, and if he doesn't eat properly. Assessment:: Uncontrolled Drug: Lantus 100UT/mL 48U QD. Pharmacist Assessment: Appropriate, Query Effectiveness Drug: Lyumjev 100U/mL per sliding scale TID. Pharmacist Assessment: Appropriate, Query Effectiveness Drug: Ozempic 0.25mg  once weekly. Pharmacist Assessment: Appropriate, Query Effectiveness GERD Assessment Assessed today?: No Drug: Omeprazole 20mg  QD. Chronic kidney disease (CKD) Most Recent GFR: 29 taken on: 09/06/2022 Previous GFR: 37 taken on: 08/09/2022 Most recent microalbumin ratio: 258.70 tested on: 12/27/2019 Assessed today?: No General Disease Assessment Assessed today?: No Preventative Health Care Gap: Colorectal cancer screening: Addressed Care Gap: Breast cancer screening: Patient excluded from population (Age > 75, hx of bilateral mastectomy, frailty, hospice services) Care Gap: Annual Wellness Visit (AWV): Needs to be addressed . Jason Zimmerman, PharmD  Chart review  Televisit Documentation 

## 2022-10-15 DIAGNOSIS — E1122 Type 2 diabetes mellitus with diabetic chronic kidney disease: Secondary | ICD-10-CM | POA: Diagnosis not present

## 2022-10-25 DIAGNOSIS — Z7985 Long-term (current) use of injectable non-insulin antidiabetic drugs: Secondary | ICD-10-CM | POA: Diagnosis not present

## 2022-10-25 DIAGNOSIS — E782 Mixed hyperlipidemia: Secondary | ICD-10-CM | POA: Diagnosis not present

## 2022-10-25 DIAGNOSIS — N184 Chronic kidney disease, stage 4 (severe): Secondary | ICD-10-CM | POA: Diagnosis not present

## 2022-10-25 DIAGNOSIS — E1122 Type 2 diabetes mellitus with diabetic chronic kidney disease: Secondary | ICD-10-CM | POA: Diagnosis not present

## 2022-10-25 DIAGNOSIS — Z794 Long term (current) use of insulin: Secondary | ICD-10-CM | POA: Diagnosis not present

## 2022-10-25 DIAGNOSIS — I129 Hypertensive chronic kidney disease with stage 1 through stage 4 chronic kidney disease, or unspecified chronic kidney disease: Secondary | ICD-10-CM | POA: Diagnosis not present

## 2022-10-26 ENCOUNTER — Encounter (INDEPENDENT_AMBULATORY_CARE_PROVIDER_SITE_OTHER): Payer: Medicare Other | Admitting: Ophthalmology

## 2022-11-01 DIAGNOSIS — D849 Immunodeficiency, unspecified: Secondary | ICD-10-CM | POA: Diagnosis not present

## 2022-11-01 DIAGNOSIS — N184 Chronic kidney disease, stage 4 (severe): Secondary | ICD-10-CM | POA: Diagnosis not present

## 2022-11-01 DIAGNOSIS — Z794 Long term (current) use of insulin: Secondary | ICD-10-CM | POA: Diagnosis not present

## 2022-11-01 DIAGNOSIS — E1122 Type 2 diabetes mellitus with diabetic chronic kidney disease: Secondary | ICD-10-CM | POA: Diagnosis not present

## 2022-11-01 DIAGNOSIS — I10 Essential (primary) hypertension: Secondary | ICD-10-CM | POA: Diagnosis not present

## 2022-11-01 DIAGNOSIS — I129 Hypertensive chronic kidney disease with stage 1 through stage 4 chronic kidney disease, or unspecified chronic kidney disease: Secondary | ICD-10-CM | POA: Diagnosis not present

## 2022-11-01 DIAGNOSIS — Z940 Kidney transplant status: Secondary | ICD-10-CM | POA: Diagnosis not present

## 2022-11-08 ENCOUNTER — Telehealth: Payer: Self-pay | Admitting: Gastroenterology

## 2022-11-08 ENCOUNTER — Encounter: Payer: Self-pay | Admitting: Internal Medicine

## 2022-11-08 ENCOUNTER — Ambulatory Visit: Payer: Medicare Other | Admitting: Internal Medicine

## 2022-11-08 VITALS — BP 132/82 | HR 67 | Temp 97.2°F | Resp 18 | Ht 70.0 in | Wt 174.0 lb

## 2022-11-08 DIAGNOSIS — I10 Essential (primary) hypertension: Secondary | ICD-10-CM

## 2022-11-08 DIAGNOSIS — K760 Fatty (change of) liver, not elsewhere classified: Secondary | ICD-10-CM | POA: Diagnosis not present

## 2022-11-08 DIAGNOSIS — E1121 Type 2 diabetes mellitus with diabetic nephropathy: Secondary | ICD-10-CM

## 2022-11-08 DIAGNOSIS — Z940 Kidney transplant status: Secondary | ICD-10-CM

## 2022-11-08 NOTE — Assessment & Plan Note (Signed)
I reviewed his notes from endocrinology where last week his HgBA1c was 12.7%.  They did adjust his SSI.  He will continue with his freestyle libre CGM.

## 2022-11-08 NOTE — Assessment & Plan Note (Signed)
His BP has been well controlled.  We will continue on coreg.

## 2022-11-08 NOTE — Assessment & Plan Note (Signed)
His kidney transplant is stable per the transplants teams notes from 11/01/2022.  He will continue to see them in followup.

## 2022-11-08 NOTE — Assessment & Plan Note (Signed)
His liver enzymes are doing well.  He has steatosis seen on liver US in the past.  I want him to control his diabetes and eat healthy and exercise.

## 2022-11-08 NOTE — Progress Notes (Signed)
Office Visit  Subjective   Patient ID: Tarez Bowns   DOB: 08-30-1955   Age: 67 y.o.   MRN: [id number]   Chief Complaint Chief Complaint  Patient presents with   Follow-up    3 month follow up     History of Present Illness The patient is a 67 year old Caucasian/White male who returns for a follow-up visit for his T2 diabetes.  He was diagnosed with diabetes in 2004.  On his last visit, I had not seen him in several years and he had undergone a kidney transplant in 04/2022.  At that time, he had seen endocrinology and they had just started him on Ozempic the week before so I did not repeat his HgBA1c.  Over the interim, he did seen endocrinology again on 10/25/2022.  They noted that his HgBA1c was 12.7% and had worsened at that time.  They felt his blood sugars were elevated during daytime likely because of prednisone effect.  They increased his dose of lyumjev insulin at that time.  He remains lantus 44 Units daily, lyumjev sliding scale where he has a new chart which he normally takes 10-12 units 3 times per day and ozempic 0.25mg  subcut weekly.  He is not walking as much as they would like.  He specifically denies unexplained abdominal pain, nausea or vomiting, or documented hypoglycemia. He uses the [employer] CGM  and his sugars range somewhere between 100-450's.  His last HgbA1c was done on 10/25/2022 and was 12.7%. He does have long term complications of CAD/PVD, diabetic retinopathy and diabetic nephropathy but no diabetic neuropathy.  His last dilated eye exam by Dr. Luciana Axe in [place] was done on 10/25/2021 where they noted his right eye had moderate proliferative reinoatphy and macular edema.  He states he goes back this Thursday for a yearly eye exam with Dr. Luciana Axe.   The patient has a history of ESRD secondary to T2 diabetes and HTN where he underwent renal transplantation in 04/10/2022.  His last visit with the transplant team was on 11/01/2022.  They noted at that time that in  their note that he had some thrombocytopenia when he was in the office.  He had a blood transfusion in 04/2022 and a platelet transfusion at that time as well.  They did check his labs and his HgB level on 11/01/2022 was 12.6 and his platelet level was 127.  His last kidney functions were done on 11/01/2022 and his BUN was 25, creatinine 2.2 and GFR of 32.  They checked his prograf levels and these look good and they felt his tranplant was stable.  He remains on prednisone 5mg  daily and prograf.  His neprhologist is Dr. Sabra Heck at [facility].  The patient continues to avoid NSAIDS at this time.     The patient is a 67 year old Caucasian/White male who presents for a follow-up evaluation of hypertension.  On his last visit, his BP was elevated.  I reviewed his notes from transplant and endocrinology and his BP was controlled at his last 2 visits.  The patient has been checking his blood pressure at home.  The patient states his systolic BP runs in the 120's.  The patient's current medications include: coreg 3.125mg  po BID. The patient has been tolerating his medications well. The patient denies any headache, visual changes, dizziness, lightheadness, chest pain, shortness of breath, weakness/numbness, and edema. He reports there have been no other symptoms noted.   The transplant team also has noted  that he has had elevated LFT's in the past.  He is on a statin, bactrim, fluconzaole initially on hold, but Bactrim re-started at prior visit.  He had a liver US at [facility] showing steatosis. Viral hepatitis titers were negative.  He had a CMP done on 11/01/2022 and his AST was 43 and ALT 51.        Past Medical History Past Medical History:  Diagnosis Date   Anemia associated with chronic renal failure    CKD (chronic kidney disease), stage V (HCC)    Coronary disease    Diabetic nephropathy associated with type 2 diabetes mellitus (HCC)    Diabetic retinopathy (HCC)    Fatty liver    per  patient's wife   GERD (gastroesophageal reflux disease) 03/14/2022   no meds   Heart attack (HCC) 10/19/2010   History of prostate cancer    Hyperlipidemia    Hypertension, renal    Ocular hypertension    Prostate cancer (HCC) 2011   RTA (renal tubular acidosis)    Secondary hyperparathyroidism, renal (HCC)    Vitamin D deficiency      Allergies Allergies  Allergen Reactions   Cefazolin Anaphylaxis    Administered for surgical prophylaxis along with platelets and pt subsequently had presumed allergic reaction with hypotension, tachycardia elevate peak pressures     Medications  Current Outpatient Medications:    acetaminophen (TYLENOL) 500 MG tablet, Take 1,000 mg by mouth every 6 (six) hours as needed (pain.)., Disp: , Rfl:    aspirin EC 81 MG tablet, Take 81 mg by mouth at bedtime., Disp: , Rfl:    atorvastatin (LIPITOR) 10 MG tablet, Take 1 tablet by mouth daily., Disp: , Rfl:    carvedilol (COREG) 3.125 MG tablet, Take 3.125 mg by mouth 2 (two) times daily with a meal., Disp: , Rfl:    ezetimibe (ZETIA) 10 MG tablet, Take 10 mg by mouth in the morning., Disp: , Rfl:    glucose blood (FREESTYLE TEST STRIPS) test strip, 1 each by Other route 2 (two) times daily as needed for other. PRN, Disp: 102 each, Rfl: 5   insulin glargine (LANTUS) 100 UNIT/ML Solostar Pen, Inject 48 Units into the skin at bedtime., Disp: , Rfl:    LYUMJEV KWIKPEN 100 UNIT/ML KwikPen, Inject into the skin., Disp: , Rfl:    mycophenolate (MYFORTIC) 180 MG EC tablet, Take 180 mg by mouth 2 (two) times daily., Disp: , Rfl:    nitroGLYCERIN (NITROSTAT) 0.4 MG SL tablet, Place 0.4 mg under the tongue every 5 (five) minutes x 3 doses as needed for chest pain., Disp: , Rfl:    omeprazole (PRILOSEC) 20 MG capsule, Take 20 mg by mouth daily., Disp: , Rfl:    OZEMPIC, 0.25 OR 0.5 MG/DOSE, 2 MG/3ML SOPN, Inject 0.25 mg into the skin once a week., Disp: , Rfl:    predniSONE (DELTASONE) 5 MG tablet, Take 5 mg by mouth  daily with breakfast., Disp: , Rfl:    predniSONE (DELTASONE) 5 MG tablet, Take 1 tablet by mouth daily., Disp: , Rfl:    sulfamethoxazole-trimethoprim (BACTRIM) 400-80 MG tablet, Take 1 tablet by mouth 3 (three) times a week., Disp: , Rfl:    tacrolimus (PROGRAF) 0.5 MG capsule, Take 0.5 mg by mouth daily. Total a day is 1.5mg  (takes 1mg  BID and 0.5mg  once a day), Disp: , Rfl:    tacrolimus (PROGRAF) 1 MG capsule, Take 1.5 mg by mouth 2 (two) times daily., Disp: , Rfl:  timolol (TIMOPTIC) 0.5 % ophthalmic solution, Place 1 drop into the left eye 2 (two) times daily., Disp: 10 mL, Rfl: 2   Review of Systems Review of Systems  Constitutional:  Negative for chills and fever.  Eyes:  Negative for blurred vision and double vision.  Respiratory:  Negative for cough and shortness of breath.   Cardiovascular:  Negative for chest pain, palpitations and leg swelling.  Gastrointestinal:  Negative for abdominal pain, constipation, diarrhea, nausea and vomiting.  Genitourinary:  Negative for dysuria, frequency and hematuria.  Musculoskeletal:  Negative for myalgias.  Skin:  Negative for itching and rash.  Neurological:  Negative for dizziness, weakness and headaches.  Endo/Heme/Allergies:  Positive for polydipsia.       Objective:    Vitals BP 132/82 (BP Location: Left Arm, Patient Position: Sitting, Cuff Size: Normal)   Pulse 67   Temp (!) 97.2 F (36.2 C)   Resp 18   Ht 5\' 10"  (1.778 m)   Wt 174 lb (78.9 kg)   SpO2 98%   BMI 24.97 kg/m    Physical Examination Physical Exam Constitutional:      Appearance: Normal appearance. He is not ill-appearing.  Cardiovascular:     Rate and Rhythm: Normal rate and regular rhythm.     Pulses: Normal pulses.     Heart sounds: No murmur heard.    No friction rub. No gallop.  Pulmonary:     Effort: Pulmonary effort is normal. No respiratory distress.     Breath sounds: No wheezing, rhonchi or rales.  Abdominal:     General: Abdomen is flat.  Bowel sounds are normal. There is no distension.     Palpations: Abdomen is soft.     Tenderness: There is no abdominal tenderness.  Musculoskeletal:     Right lower leg: No edema.     Left lower leg: No edema.  Skin:    General: Skin is warm and dry.     Findings: No rash.  Neurological:     General: No focal deficit present.     Mental Status: He is alert and oriented to person, place, and time.  Psychiatric:        Mood and Affect: Mood normal.        Behavior: Behavior normal.        Assessment & Plan:   Essential hypertension His BP has been well controlled.  We will continue on coreg.  Steatosis of liver His liver enzymes are doing well.  He has steatosis seen on liver US in the past.  I want him to control his diabetes and eat healthy and exercise.  Diabetic nephropathy associated with type 2 diabetes mellitus (HCC) I reviewed his notes from endocrinology where last week his HgBA1c was 12.7%.  They did adjust his SSI.  He will continue with his freestyle libre CGM.  Kidney transplanted His kidney transplant is stable per the transplants teams notes from 11/01/2022.  He will continue to see them in followup.    Return in about 3 months (around 02/08/2023) for annual.   Crist Fat, MD

## 2022-11-08 NOTE — Telephone Encounter (Signed)
Good Morning Dr Barron Alvine,  I'm working on recalls and came across this pt I see that he has had a procedure since his OV recall was due, would you still like me to send a letter for him to call and schedule a follow up or cancel recall? Please advise  Thank You!

## 2022-11-10 ENCOUNTER — Encounter: Payer: Self-pay | Admitting: Gastroenterology

## 2022-11-10 DIAGNOSIS — E113411 Type 2 diabetes mellitus with severe nonproliferative diabetic retinopathy with macular edema, right eye: Secondary | ICD-10-CM | POA: Diagnosis not present

## 2022-11-10 DIAGNOSIS — H2511 Age-related nuclear cataract, right eye: Secondary | ICD-10-CM | POA: Diagnosis not present

## 2022-11-10 DIAGNOSIS — H2702 Aphakia, left eye: Secondary | ICD-10-CM | POA: Diagnosis not present

## 2022-11-10 DIAGNOSIS — Z794 Long term (current) use of insulin: Secondary | ICD-10-CM | POA: Diagnosis not present

## 2022-11-11 DIAGNOSIS — E1122 Type 2 diabetes mellitus with diabetic chronic kidney disease: Secondary | ICD-10-CM | POA: Diagnosis not present

## 2022-11-14 DIAGNOSIS — E1122 Type 2 diabetes mellitus with diabetic chronic kidney disease: Secondary | ICD-10-CM | POA: Diagnosis not present

## 2022-11-24 DIAGNOSIS — E113411 Type 2 diabetes mellitus with severe nonproliferative diabetic retinopathy with macular edema, right eye: Secondary | ICD-10-CM | POA: Diagnosis not present

## 2022-11-24 LAB — HM DIABETES EYE EXAM

## 2022-11-29 DIAGNOSIS — I129 Hypertensive chronic kidney disease with stage 1 through stage 4 chronic kidney disease, or unspecified chronic kidney disease: Secondary | ICD-10-CM | POA: Diagnosis not present

## 2022-11-29 DIAGNOSIS — N184 Chronic kidney disease, stage 4 (severe): Secondary | ICD-10-CM | POA: Diagnosis not present

## 2022-11-29 DIAGNOSIS — Z794 Long term (current) use of insulin: Secondary | ICD-10-CM | POA: Diagnosis not present

## 2022-11-29 DIAGNOSIS — Z940 Kidney transplant status: Secondary | ICD-10-CM | POA: Diagnosis not present

## 2022-11-29 DIAGNOSIS — E1122 Type 2 diabetes mellitus with diabetic chronic kidney disease: Secondary | ICD-10-CM | POA: Diagnosis not present

## 2022-11-29 DIAGNOSIS — I10 Essential (primary) hypertension: Secondary | ICD-10-CM | POA: Diagnosis not present

## 2022-11-29 DIAGNOSIS — D849 Immunodeficiency, unspecified: Secondary | ICD-10-CM | POA: Diagnosis not present

## 2022-12-15 DIAGNOSIS — E1122 Type 2 diabetes mellitus with diabetic chronic kidney disease: Secondary | ICD-10-CM | POA: Diagnosis not present

## 2022-12-20 DIAGNOSIS — E119 Type 2 diabetes mellitus without complications: Secondary | ICD-10-CM | POA: Diagnosis not present

## 2022-12-20 DIAGNOSIS — I10 Essential (primary) hypertension: Secondary | ICD-10-CM | POA: Diagnosis not present

## 2022-12-20 DIAGNOSIS — I2510 Atherosclerotic heart disease of native coronary artery without angina pectoris: Secondary | ICD-10-CM | POA: Diagnosis not present

## 2022-12-20 DIAGNOSIS — I252 Old myocardial infarction: Secondary | ICD-10-CM | POA: Diagnosis not present

## 2022-12-27 DIAGNOSIS — D849 Immunodeficiency, unspecified: Secondary | ICD-10-CM | POA: Diagnosis not present

## 2022-12-27 DIAGNOSIS — I10 Essential (primary) hypertension: Secondary | ICD-10-CM | POA: Diagnosis not present

## 2022-12-27 DIAGNOSIS — Z940 Kidney transplant status: Secondary | ICD-10-CM | POA: Diagnosis not present

## 2022-12-27 DIAGNOSIS — E1122 Type 2 diabetes mellitus with diabetic chronic kidney disease: Secondary | ICD-10-CM | POA: Diagnosis not present

## 2022-12-27 DIAGNOSIS — N184 Chronic kidney disease, stage 4 (severe): Secondary | ICD-10-CM | POA: Diagnosis not present

## 2022-12-27 DIAGNOSIS — I129 Hypertensive chronic kidney disease with stage 1 through stage 4 chronic kidney disease, or unspecified chronic kidney disease: Secondary | ICD-10-CM | POA: Diagnosis not present

## 2022-12-27 DIAGNOSIS — Z794 Long term (current) use of insulin: Secondary | ICD-10-CM | POA: Diagnosis not present

## 2023-01-09 ENCOUNTER — Other Ambulatory Visit: Payer: Self-pay

## 2023-01-09 ENCOUNTER — Emergency Department (HOSPITAL_COMMUNITY): Payer: Medicare Other

## 2023-01-09 ENCOUNTER — Encounter (HOSPITAL_COMMUNITY): Payer: Self-pay

## 2023-01-09 ENCOUNTER — Emergency Department (HOSPITAL_COMMUNITY)
Admission: EM | Admit: 2023-01-09 | Discharge: 2023-01-10 | Disposition: A | Discharge status: Home / Self Care | Payer: Medicare Other | Attending: Emergency Medicine | Admitting: Emergency Medicine

## 2023-01-09 DIAGNOSIS — Y990 Civilian activity done for income or pay: Secondary | ICD-10-CM | POA: Diagnosis not present

## 2023-01-09 DIAGNOSIS — M25551 Pain in right hip: Secondary | ICD-10-CM | POA: Diagnosis not present

## 2023-01-09 DIAGNOSIS — S7291XA Unspecified fracture of right femur, initial encounter for closed fracture: Secondary | ICD-10-CM

## 2023-01-09 DIAGNOSIS — R739 Hyperglycemia, unspecified: Secondary | ICD-10-CM | POA: Diagnosis not present

## 2023-01-09 DIAGNOSIS — S0101XA Laceration without foreign body of scalp, initial encounter: Secondary | ICD-10-CM | POA: Insufficient documentation

## 2023-01-09 DIAGNOSIS — S199XXA Unspecified injury of neck, initial encounter: Secondary | ICD-10-CM | POA: Diagnosis not present

## 2023-01-09 DIAGNOSIS — W1839XA Other fall on same level, initial encounter: Secondary | ICD-10-CM | POA: Diagnosis not present

## 2023-01-09 DIAGNOSIS — S0990XA Unspecified injury of head, initial encounter: Secondary | ICD-10-CM

## 2023-01-09 DIAGNOSIS — Z794 Long term (current) use of insulin: Secondary | ICD-10-CM | POA: Diagnosis not present

## 2023-01-09 DIAGNOSIS — S32411A Displaced fracture of anterior wall of right acetabulum, initial encounter for closed fracture: Secondary | ICD-10-CM | POA: Diagnosis not present

## 2023-01-09 DIAGNOSIS — Z23 Encounter for immunization: Secondary | ICD-10-CM | POA: Diagnosis not present

## 2023-01-09 DIAGNOSIS — Z7982 Long term (current) use of aspirin: Secondary | ICD-10-CM | POA: Insufficient documentation

## 2023-01-09 DIAGNOSIS — W19XXXA Unspecified fall, initial encounter: Secondary | ICD-10-CM

## 2023-01-09 DIAGNOSIS — S79911A Unspecified injury of right hip, initial encounter: Secondary | ICD-10-CM | POA: Diagnosis not present

## 2023-01-09 DIAGNOSIS — S32401A Unspecified fracture of right acetabulum, initial encounter for closed fracture: Secondary | ICD-10-CM | POA: Diagnosis not present

## 2023-01-09 DIAGNOSIS — M8580 Other specified disorders of bone density and structure, unspecified site: Secondary | ICD-10-CM | POA: Diagnosis not present

## 2023-01-09 DIAGNOSIS — S0003XA Contusion of scalp, initial encounter: Secondary | ICD-10-CM | POA: Diagnosis not present

## 2023-01-09 DIAGNOSIS — S32421A Displaced fracture of posterior wall of right acetabulum, initial encounter for closed fracture: Secondary | ICD-10-CM | POA: Insufficient documentation

## 2023-01-09 DIAGNOSIS — S72001A Fracture of unspecified part of neck of right femur, initial encounter for closed fracture: Secondary | ICD-10-CM | POA: Diagnosis not present

## 2023-01-09 DIAGNOSIS — M25461 Effusion, right knee: Secondary | ICD-10-CM | POA: Diagnosis not present

## 2023-01-09 DIAGNOSIS — S72391A Other fracture of shaft of right femur, initial encounter for closed fracture: Secondary | ICD-10-CM | POA: Diagnosis not present

## 2023-01-09 DIAGNOSIS — J320 Chronic maxillary sinusitis: Secondary | ICD-10-CM | POA: Diagnosis not present

## 2023-01-09 LAB — BASIC METABOLIC PANEL
Anion gap: 11 (ref 5–15)
BUN: 24 mg/dL — ABNORMAL HIGH (ref 8–23)
CO2: 21 mmol/L — ABNORMAL LOW (ref 22–32)
Calcium: 9.3 mg/dL (ref 8.9–10.3)
Chloride: 100 mmol/L (ref 98–111)
Creatinine, Ser: 1.93 mg/dL — ABNORMAL HIGH (ref 0.61–1.24)
GFR, Estimated: 37 mL/min — ABNORMAL LOW (ref >60)
Glucose, Bld: 394 mg/dL — ABNORMAL HIGH (ref 70–99)
Potassium: 4.8 mmol/L (ref 3.5–5.1)
Sodium: 132 mmol/L — ABNORMAL LOW (ref 135–145)

## 2023-01-09 LAB — CBC WITH DIFFERENTIAL/PLATELET
Abs Immature Granulocytes: 0.04 10*3/uL (ref 0.00–0.07)
Basophils Absolute: 0 10*3/uL (ref 0.0–0.1)
Basophils Relative: 0 %
Eosinophils Absolute: 0 10*3/uL (ref 0.0–0.5)
Eosinophils Relative: 0 %
HCT: 37.6 % — ABNORMAL LOW (ref 39.0–52.0)
Hemoglobin: 12.7 g/dL — ABNORMAL LOW (ref 13.0–17.0)
Immature Granulocytes: 0 %
Lymphocytes Relative: 6 %
Lymphs Abs: 0.7 10*3/uL (ref 0.7–4.0)
MCH: 32.3 pg (ref 26.0–34.0)
MCHC: 33.8 g/dL (ref 30.0–36.0)
MCV: 95.7 fL (ref 80.0–100.0)
Monocytes Absolute: 0.7 10*3/uL (ref 0.1–1.0)
Monocytes Relative: 7 %
Neutro Abs: 9.3 10*3/uL — ABNORMAL HIGH (ref 1.7–7.7)
Neutrophils Relative %: 87 %
Platelets: 78 10*3/uL — ABNORMAL LOW (ref 150–400)
RBC: 3.93 MIL/uL — ABNORMAL LOW (ref 4.22–5.81)
RDW: 13 % (ref 11.5–15.5)
WBC: 10.8 10*3/uL — ABNORMAL HIGH (ref 4.0–10.5)
nRBC: 0 % (ref 0.0–0.2)

## 2023-01-09 LAB — PROTIME-INR
INR: 1.1 (ref 0.8–1.2)
Prothrombin Time: 14.6 s (ref 11.4–15.2)

## 2023-01-09 LAB — TYPE AND SCREEN
ABO/RH(D): O POS
Antibody Screen: NEGATIVE

## 2023-01-09 LAB — APTT: aPTT: 29 s (ref 24–36)

## 2023-01-09 LAB — CBG MONITORING, ED: Glucose-Capillary: 406 mg/dL — ABNORMAL HIGH (ref 70–99)

## 2023-01-09 MED ORDER — OXYCODONE HCL 5 MG PO TABS
10.0000 mg | ORAL_TABLET | Freq: Once | ORAL | Status: AC
  Administered 2023-01-09: 10 mg via ORAL
  Filled 2023-01-09: qty 2

## 2023-01-09 MED ORDER — IBUPROFEN 600 MG PO TABS: 600.0000 mg | ORAL_TABLET | Freq: Four times a day (QID) | ORAL | 0 refills | Status: DC | PRN

## 2023-01-09 MED ORDER — TETANUS-DIPHTH-ACELL PERTUSSIS 5-2.5-18.5 LF-MCG/0.5 IM SUSY
0.5000 mL | PREFILLED_SYRINGE | Freq: Once | INTRAMUSCULAR | Status: AC
  Administered 2023-01-09: 0.5 mL via INTRAMUSCULAR
  Filled 2023-01-09: qty 0.5

## 2023-01-09 MED ORDER — SODIUM CHLORIDE 0.9 % IV BOLUS: 1000.0000 mL | Freq: Once | INTRAVENOUS | Status: DC

## 2023-01-09 MED ORDER — FENTANYL CITRATE PF 50 MCG/ML IJ SOSY
50.0000 ug | PREFILLED_SYRINGE | Freq: Once | INTRAMUSCULAR | Status: AC
  Administered 2023-01-09: 50 ug via INTRAVENOUS
  Filled 2023-01-09: qty 1

## 2023-01-09 MED ORDER — SODIUM CHLORIDE 0.9 % IV SOLN: Freq: Once | INTRAVENOUS | Status: AC

## 2023-01-09 MED ORDER — OXYCODONE HCL 5 MG PO TABS
5.0000 mg | ORAL_TABLET | Freq: Four times a day (QID) | ORAL | 0 refills | Status: DC | PRN
Start: 2023-01-09 — End: 2023-01-11

## 2023-01-09 NOTE — ED Provider Triage Note (Signed)
Emergency Medicine Provider Triage Evaluation Note  Jason Zimmerman , a 67 y.o. male  was evaluated in triage.  Pt complains of right hip pain and lac to the right side of his head after a mechanical fall.  Patient states he tripped and landed on his right side hitting the floor joist.  Denies LOC.  He takes 81 mg ASA daily, no blood thinners.  Denies headache, vision changes, nausea, vomiting.   Review of Systems  Positive: As above Negative: As above  Physical Exam  BP 123/76 (BP Location: Right Arm)   Pulse 69   Temp 97.9 F (36.6 C) (Oral)   Resp 18   SpO2 100%  Gen:   Awake, no distress   Resp:  Normal effort  MSK:   Moves extremities without difficulty  Other:  A&Ox3, normal cervical spine ROM without pain, no midline tenderness; lac with oozing and dried blood to R side of head, no palpable fracture or mass  Medical Decision Making  Medically screening exam initiated at 1:48 PM.  Appropriate orders placed.  Jason Zimmerman was informed that the remainder of the evaluation will be completed by another provider, this initial triage assessment does not replace that evaluation, and the importance of remaining in the ED until their evaluation is complete.     Melton Alar R, PA-C 01/09/23 1351

## 2023-01-09 NOTE — ED Provider Notes (Signed)
[place] EMERGENCY DEPARTMENT AT [facility] Provider Note   CSN: [id number] Arrival date & time: 01/09/23  1344     History  Chief Complaint  Patient presents with   Jason Zimmerman is a 67 y.o. male.  Patient is a 67 year old male who fell through a floor during his job today.  Fall occurred several hours prior to arrival.  He admits to head trauma.  Severe right-sided upper thigh and hip pain.  Denies loss of consciousness.  No blood thinners.  The history is provided by the patient. No language interpreter was used.  Fall Pertinent negatives include no chest pain, no abdominal pain and no shortness of breath.       Home Medications Prior to Admission medications   Medication Sig Start Date End Date Taking? Authorizing Provider  ibuprofen (ADVIL) 600 MG tablet Take 1 tablet (600 mg total) by mouth every 6 (six) hours as needed for mild pain. 01/09/23  Yes Edwin Dada P, DO  oxyCODONE (ROXICODONE) 5 MG immediate release tablet Take 1 tablet (5 mg total) by mouth every 6 (six) hours as needed for severe pain. 01/09/23  Yes Edwin Dada P, DO  acetaminophen (TYLENOL) 500 MG tablet Take 1,000 mg by mouth every 6 (six) hours as needed (pain.).    [provider]  aspirin EC 81 MG tablet Take 81 mg by mouth at bedtime.    [provider]  atorvastatin (LIPITOR) 10 MG tablet Take 1 tablet by mouth daily. 05/24/22   [provider]  carvedilol (COREG) 3.125 MG tablet Take 3.125 mg by mouth 2 (two) times daily with a meal. 08/09/22   [provider]  ezetimibe (ZETIA) 10 MG tablet Take 10 mg by mouth in the morning.    [provider]  glucose blood (FREESTYLE TEST STRIPS) test strip 1 each by Other route 2 (two) times daily as needed for other. PRN 11/11/10   Romero Belling, MD  insulin glargine (LANTUS) 100 UNIT/ML Solostar Pen Inject 48 Units into the skin at bedtime. 12/24/15   [provider]  LYUMJEV KWIKPEN 100  UNIT/ML KwikPen Inject into the skin.    [provider]  mycophenolate (MYFORTIC) 180 MG EC tablet Take 180 mg by mouth 2 (two) times daily. 04/13/22   [provider]  nitroGLYCERIN (NITROSTAT) 0.4 MG SL tablet Place 0.4 mg under the tongue every 5 (five) minutes x 3 doses as needed for chest pain.    [provider]  omeprazole (PRILOSEC) 20 MG capsule Take 20 mg by mouth daily.    [provider]  OZEMPIC, 0.25 OR 0.5 MG/DOSE, 2 MG/3ML SOPN Inject 0.25 mg into the skin once a week. 07/06/22   [provider]  predniSONE (DELTASONE) 5 MG tablet Take 5 mg by mouth daily with breakfast. 04/20/22   [provider]  predniSONE (DELTASONE) 5 MG tablet Take 1 tablet by mouth daily. 05/24/22   [provider]  sulfamethoxazole-trimethoprim (BACTRIM) 400-80 MG tablet Take 1 tablet by mouth 3 (three) times a week.    [provider]  tacrolimus (PROGRAF) 0.5 MG capsule Take 0.5 mg by mouth daily. Total a day is 1.5mg  (takes 1mg  BID and 0.5mg  once a day) 06/08/22 09/06/23  [provider]  tacrolimus (PROGRAF) 1 MG capsule Take 1.5 mg by mouth 2 (two) times daily. 04/20/22   [provider]  timolol (TIMOPTIC) 0.5 % ophthalmic solution Place 1 drop into the left eye 2 (  two) times daily. 10/25/21   Rankin, Alford Highland, MD      Allergies    Cefazolin    Review of Systems   Review of Systems  Constitutional:  Negative for chills and fever.  HENT:  Negative for ear pain and sore throat.   Eyes:  Negative for pain and visual disturbance.  Respiratory:  Negative for cough and shortness of breath.   Cardiovascular:  Negative for chest pain and palpitations.  Gastrointestinal:  Negative for abdominal pain and vomiting.  Genitourinary:  Negative for dysuria and hematuria.  Musculoskeletal:  Negative for arthralgias and back pain.  Skin:  Positive for wound. Negative for color change and rash.  Neurological:  Negative for seizures  and syncope.  All other systems reviewed and are negative.   Physical Exam Updated Vital Signs BP 122/74   Pulse 86   Temp 98 F (36.7 C) (Oral)   Resp 18   Ht 5\' 10"  (1.778 m)   Wt 77.1 kg   SpO2 99%   BMI 24.39 kg/m  Physical Exam Vitals and nursing note reviewed.  Constitutional:      General: He is not in acute distress.    Appearance: He is well-developed.  HENT:     Head: Normocephalic.   Eyes:     General: Lids are normal. Vision grossly intact.     Conjunctiva/sclera: Conjunctivae normal.     Pupils: Pupils are equal, round, and reactive to light.  Cardiovascular:     Rate and Rhythm: Normal rate and regular rhythm.     Pulses:          Dorsalis pedis pulses are 2+ on the right side and 2+ on the left side.     Heart sounds: No murmur heard. Pulmonary:     Effort: Pulmonary effort is normal. No respiratory distress.     Breath sounds: Normal breath sounds.  Abdominal:     Palpations: Abdomen is soft.     Tenderness: There is no abdominal tenderness.  Musculoskeletal:        General: No swelling.     Cervical back: Neck supple. No bony tenderness.     Thoracic back: No bony tenderness.     Lumbar back: No bony tenderness.     Right hip: Bony tenderness present.     Left hip: Normal.     Right upper leg: Bony tenderness present.     Left upper leg: Normal.     Right knee: Normal.     Left knee: Normal.     Right lower leg: Normal.     Right ankle: Normal.     Left ankle: Normal.  Skin:    General: Skin is warm and dry.     Capillary Refill: Capillary refill takes less than 2 seconds.       Neurological:     Mental Status: He is alert.     GCS: GCS eye subscore is 4. GCS verbal subscore is 5. GCS motor subscore is 6.     Sensory: Sensation is intact.     Motor: Motor function is intact.  Psychiatric:        Mood and Affect: Mood normal.     ED Results / Procedures / Treatments   Labs (all labs ordered are listed, but only abnormal results are  displayed) Labs Reviewed  CBC WITH DIFFERENTIAL/PLATELET - Abnormal; Notable for the following components:      Result Value   WBC 10.8 (*)  RBC 3.93 (*)    Hemoglobin 12.7 (*)    HCT 37.6 (*)    Platelets 78 (*)    Neutro Abs 9.3 (*)    All other components within normal limits  BASIC METABOLIC PANEL - Abnormal; Notable for the following components:   Sodium 132 (*)    CO2 21 (*)    Glucose, Bld 394 (*)    BUN 24 (*)    Creatinine, Ser 1.93 (*)    GFR, Estimated 37 (*)    All other components within normal limits  CBG MONITORING, ED - Abnormal; Notable for the following components:   Glucose-Capillary 406 (*)    All other components within normal limits  PROTIME-INR  APTT  TYPE AND SCREEN    EKG None  Radiology CT Hip Right Wo Contrast  Result Date: 01/09/2023 CLINICAL DATA:  Upper leg trauma, fall with abrasion to right hip and laceration to right side. EXAM: CT OF THE RIGHT HIP AND FEMUR WITHOUT CONTRAST TECHNIQUE: Multidetector CT imaging of the right hip was performed according to the standard protocol. Multiplanar CT image reconstructions were also generated. RADIATION DOSE REDUCTION: This exam was performed according to the departmental dose-optimization program which includes automated exposure control, adjustment of the mA and/or kV according to patient size and/or use of iterative reconstruction technique. COMPARISON:  01/09/2023. FINDINGS: Bones/Joint/Cartilage There is a minimally displaced comminuted fracture of the right acetabulum involving the posterior and anterior acetabular walls. There is cortical buckling involving the inferior pubic ramus on the right. The remaining bony structures are intact. No dislocation is seen. Trace joint effusion is noted at the knee. Ligaments Suboptimally assessed by CT. Muscles and Tendons There is asymmetric enlargement of the internal obturator muscle on the right, possibly reflecting edema or hematoma. Soft tissues Vascular  calcifications are noted. Strandy opacities are noted in the subcutaneous tissues over the right hip, likely contusion. A renal transplant is noted in the pelvis on the right. A small fat containing umbilical hernia is present. IMPRESSION: 1. Minimally displaced comminuted fracture of the acetabulum on the right involving the anterior and posterior walls. 2. Cortical buckling of the inferior pubic ramus on the right, possible nondisplaced fracture. Electronically Signed   By: Thornell Sartorius M.D.   On: 01/09/2023 21:44   CT FEMUR RIGHT WO CONTRAST  Result Date: 01/09/2023 CLINICAL DATA:  Upper leg trauma, fall with abrasion to right hip and laceration to right side. EXAM: CT OF THE RIGHT HIP AND FEMUR WITHOUT CONTRAST TECHNIQUE: Multidetector CT imaging of the right hip was performed according to the standard protocol. Multiplanar CT image reconstructions were also generated. RADIATION DOSE REDUCTION: This exam was performed according to the departmental dose-optimization program which includes automated exposure control, adjustment of the mA and/or kV according to patient size and/or use of iterative reconstruction technique. COMPARISON:  01/09/2023. FINDINGS: Bones/Joint/Cartilage There is a minimally displaced comminuted fracture of the right acetabulum involving the posterior and anterior acetabular walls. There is cortical buckling involving the inferior pubic ramus on the right. The remaining bony structures are intact. No dislocation is seen. Trace joint effusion is noted at the knee. Ligaments Suboptimally assessed by CT. Muscles and Tendons There is asymmetric enlargement of the internal obturator muscle on the right, possibly reflecting edema or hematoma. Soft tissues Vascular calcifications are noted. Strandy opacities are noted in the subcutaneous tissues over the right hip, likely contusion. A renal transplant is noted in the pelvis on the right. A small fat containing umbilical hernia  is present.  IMPRESSION: 1. Minimally displaced comminuted fracture of the acetabulum on the right involving the anterior and posterior walls. 2. Cortical buckling of the inferior pubic ramus on the right, possible nondisplaced fracture. Electronically Signed   By: Thornell Sartorius M.D.   On: 01/09/2023 21:44   CT Head Wo Contrast  Result Date: 01/09/2023 CLINICAL DATA:  Head trauma, minor (Age >= 65y); Neck trauma (Age >= 65y). EXAM: CT HEAD WITHOUT CONTRAST CT CERVICAL SPINE WITHOUT CONTRAST TECHNIQUE: Multidetector CT imaging of the head and cervical spine was performed following the standard protocol without intravenous contrast. Multiplanar CT image reconstructions of the cervical spine were also generated. RADIATION DOSE REDUCTION: This exam was performed according to the departmental dose-optimization program which includes automated exposure control, adjustment of the mA and/or kV according to patient size and/or use of iterative reconstruction technique. COMPARISON:  None available. FINDINGS: CT HEAD FINDINGS Brain: No acute hemorrhage. Cortical Verlyn Dannenberg-white differentiation is preserved. Patchy hypoattenuation of the cerebral white matter, most consistent with mild chronic small-vessel disease. Prominence of the ventricles and sulci within normal limits for age. No extra-axial collection. Basilar cisterns are patent. Vascular: No hyperdense vessel or unexpected calcification. Skull: No calvarial fracture or suspicious bone lesion. Skull base is unremarkable. Sinuses/Orbits: Postoperative changes of prior left globe vitrectomy. Moderate mucosal disease in the right maxillary sinus. Other: Right parietal scalp hematoma. CT CERVICAL SPINE FINDINGS Alignment: Normal. Skull base and vertebrae: No acute fracture. Normal craniocervical junction. No suspicious bone lesions. Soft tissues and spinal canal: No prevertebral fluid or swelling. No visible canal hematoma. Disc levels: Mild cervical spondylosis without high-grade spinal  canal stenosis. Upper chest: No acute findings. Other: Atherosclerotic calcifications of the carotid bulbs. IMPRESSION: 1. No acute intracranial abnormality. Right parietal scalp hematoma. 2. No acute cervical spine fracture or traumatic listhesis. Electronically Signed   By: Orvan Falconer M.D.   On: 01/09/2023 16:37   CT Cervical Spine Wo Contrast  Result Date: 01/09/2023 CLINICAL DATA:  Head trauma, minor (Age >= 65y); Neck trauma (Age >= 65y). EXAM: CT HEAD WITHOUT CONTRAST CT CERVICAL SPINE WITHOUT CONTRAST TECHNIQUE: Multidetector CT imaging of the head and cervical spine was performed following the standard protocol without intravenous contrast. Multiplanar CT image reconstructions of the cervical spine were also generated. RADIATION DOSE REDUCTION: This exam was performed according to the departmental dose-optimization program which includes automated exposure control, adjustment of the mA and/or kV according to patient size and/or use of iterative reconstruction technique. COMPARISON:  None available. FINDINGS: CT HEAD FINDINGS Brain: No acute hemorrhage. Cortical Laelyn Blumenthal-white differentiation is preserved. Patchy hypoattenuation of the cerebral white matter, most consistent with mild chronic small-vessel disease. Prominence of the ventricles and sulci within normal limits for age. No extra-axial collection. Basilar cisterns are patent. Vascular: No hyperdense vessel or unexpected calcification. Skull: No calvarial fracture or suspicious bone lesion. Skull base is unremarkable. Sinuses/Orbits: Postoperative changes of prior left globe vitrectomy. Moderate mucosal disease in the right maxillary sinus. Other: Right parietal scalp hematoma. CT CERVICAL SPINE FINDINGS Alignment: Normal. Skull base and vertebrae: No acute fracture. Normal craniocervical junction. No suspicious bone lesions. Soft tissues and spinal canal: No prevertebral fluid or swelling. No visible canal hematoma. Disc levels: Mild cervical  spondylosis without high-grade spinal canal stenosis. Upper chest: No acute findings. Other: Atherosclerotic calcifications of the carotid bulbs. IMPRESSION: 1. No acute intracranial abnormality. Right parietal scalp hematoma. 2. No acute cervical spine fracture or traumatic listhesis. Electronically Signed   By: Elwyn Reach.D.  On: 01/09/2023 16:37   DG Hip Unilat W or Wo Pelvis 2-3 Views Right  Result Date: 01/09/2023 CLINICAL DATA:  Pain after fall EXAM: DG HIP (WITH OR WITHOUT PELVIS) 3V RIGHT COMPARISON:  None Available. FINDINGS: Osteopenia. No fracture or dislocation. Preserved joint spaces. Surgical clips in the right hemipelvis. Scattered vascular calcifications. With this level of osteopenia subtle nondisplaced injury is difficult to completely exclude and if needed further workup with CT or MRI as clinically appropriate. IMPRESSION: Osteopenia.  Chronic changes.  No acute osseous abnormality Electronically Signed   By: Karen Kays M.D.   On: 01/09/2023 15:54    Procedures Procedures    Medications Ordered in ED Medications  Tdap (BOOSTRIX) injection 0.5 mL (0.5 mLs Intramuscular Given 01/09/23 1829)  fentaNYL (SUBLIMAZE) injection 50 mcg (50 mcg Intravenous Given 01/09/23 1821)  0.9 %  sodium chloride infusion (0 mLs Intravenous Stopped 01/09/23 1820)  fentaNYL (SUBLIMAZE) injection 50 mcg (50 mcg Intravenous Given 01/09/23 2219)  oxyCODONE (Oxy IR/ROXICODONE) immediate release tablet 10 mg (10 mg Oral Given 01/09/23 2324)    ED Course/ Medical Decision Making/ A&P                                 Medical Decision Making Amount and/or Complexity of Data Reviewed Labs: ordered. Radiology: ordered.  Risk Prescription drug management.   67 year old male presenting after fall with head trauma and right hip pain.  Abrasions and lacerations to the scalp and right proximal lateral thigh.  Concern for femur fracture.  Tetanus given. Wounds irrigated and cleaned.  Fentanyl 50 mg given  for pain.  CT head and neck demonstrates no acute process.  Patient has a superficial abrasion as well as a large hematoma to the right scalp.  Attempt to dissolve hematoma to look for underlying lacerations unsuccessful with peroxide.  At this time there is no active bleeding.  Will leave hematoma.  Otherwise patient has no spinal pain.  No neurovascular deficits on exam.  Right hip x-ray demonstrates no acute process.  Right hip and femur CT demonstrates 1. Minimally displaced comminuted fracture of the acetabulum on the right involving the anterior and posterior walls.2. Cortical buckling of the inferior pubic ramus on the right, possible nondisplaced fracture.  Ortho consulted and has reviewed images.  Recommends patient is able to do touchdown with the lower extremity but no weightbearing.  Crutches at bedside.  Will attempt to ambulate patient.  Plan for rehab if unable to ambulate.  Patient able to ambulate with crutches.  Recommended for close follow-up with orthopedics in the next 3 to 5 days.  Leg remained neurovascularly intact at discharge. Soft compartments.  Patient in no distress and overall condition improved here in the ED. Detailed discussions were had with the patient regarding current findings, and need for close f/u with PCP or on call doctor. The patient has been instructed to return immediately if the symptoms worsen in any way for re-evaluation. Patient verbalized understanding and is in agreement with current care plan. All questions answered prior to discharge.          Final Clinical Impression(s) / ED Diagnoses Final diagnoses:  Fall, initial encounter  Laceration of scalp, initial encounter  Traumatic injury of head, initial encounter  Closed fracture of right femur, unspecified fracture morphology, unspecified portion of femur, initial encounter (HCC)    Rx / DC Orders ED Discharge Orders  Ordered    oxyCODONE (ROXICODONE) 5 MG immediate release  tablet  Every 6 hours PRN        01/09/23 2358    ibuprofen (ADVIL) 600 MG tablet  Every 6 hours PRN        01/09/23 2358              Edwin Dada P, DO 01/10/23 0000

## 2023-01-09 NOTE — ED Triage Notes (Signed)
Patient BIB [facility] from work site after falling on an exposed floor joist and hitting his head. Patient is not on thinners and but did hit head and reports small abrasion to right hip as well. Patient has 2in lac to right side of head but no bleeding at this time. Patient A&Ox4, ambulatory with pain. VSS with EMS.

## 2023-01-09 NOTE — ED Notes (Signed)
Took patient back to triage for PA to reassess patient's head wound. PA is aware of condition. Wrapped patient's head with a gauze fluff and 4 by 4.

## 2023-01-09 NOTE — Discharge Instructions (Signed)
Please call orthopedic surgery office first thing tomorrow morning to establish follow-up appointment for femur fracture.   You can touchdown on the right lower extremity but no weightbearing activities.  Please use crutches.  When resting please elevate the foot.  Medication sent to pharmacy for pain control.

## 2023-01-09 NOTE — ED Notes (Signed)
Wound was cleaned.  Dr was informed.

## 2023-01-09 NOTE — ED Notes (Signed)
Call placed to ortho tech for crutches

## 2023-01-09 NOTE — ED Notes (Signed)
Triage RN was informed about pts CBG results

## 2023-01-10 NOTE — Progress Notes (Signed)
Orthopedic Tech Progress Note Patient Details:  Jason Zimmerman Dec 25, 1955 [id number]  Ortho Devices Type of Ortho Device: Crutches Ortho Device/Splint Interventions: Ordered, Application, Adjustment  Patient got up and walked good. Post Interventions Patient Tolerated: Well Instructions Provided: Care of device, Adjustment of device  Jason, Zimmerman 01/10/2023, 12:19 AM

## 2023-01-11 ENCOUNTER — Ambulatory Visit (HOSPITAL_BASED_OUTPATIENT_CLINIC_OR_DEPARTMENT_OTHER): Payer: Medicare Other | Admitting: Student

## 2023-01-11 ENCOUNTER — Other Ambulatory Visit (HOSPITAL_BASED_OUTPATIENT_CLINIC_OR_DEPARTMENT_OTHER): Payer: Self-pay

## 2023-01-11 ENCOUNTER — Encounter (HOSPITAL_COMMUNITY): Payer: Self-pay

## 2023-01-11 ENCOUNTER — Encounter (HOSPITAL_BASED_OUTPATIENT_CLINIC_OR_DEPARTMENT_OTHER): Payer: Self-pay | Admitting: Student

## 2023-01-11 DIAGNOSIS — S32401A Unspecified fracture of right acetabulum, initial encounter for closed fracture: Secondary | ICD-10-CM

## 2023-01-11 MED ORDER — TRAMADOL HCL 50 MG PO TABS
50.0000 mg | ORAL_TABLET | Freq: Four times a day (QID) | ORAL | 0 refills | Status: AC | PRN
Start: 2023-01-11 — End: 2023-01-16
  Filled 2023-01-11: qty 20, 5d supply, fill #0

## 2023-01-11 NOTE — Progress Notes (Signed)
Chief Complaint: Right hip pain     History of Present Illness:    Jason Zimmerman is a 67 y.o. male presents today for further evaluation of his right hip.  Patient was seen in the emergency department on 01/09/2023 after a fall.  Patient states that he was walking between floor joists in a house while at work and took a bad step.  Evaluation in the emergency department with his right hip showed no bony injury on x-ray, however follow-up CT scan revealed a comminuted acetabular fracture.  He is given a prescription for oxycodone 5 mg, however has been unable to pick this up due to being out of stock.  He has been taking occasional ibuprofen, however he is not supposed to be taking this due to recent kidney transplant December.  States that pain levels are moderate, feel like a constant ache, and are worse on the lateral side.  Denies any numbness or tingling.  Has been nonweightbearing utilizing crutches.   Surgical History:   Right knee quad tendon repair - 2022 Right kidney transplant - 2023  PMH/PSH/Family History/Social History/Meds/Allergies:    Past Medical History:  Diagnosis Date   Anemia associated with chronic renal failure    CKD (chronic kidney disease), stage V (HCC)    Coronary disease    Diabetic nephropathy associated with type 2 diabetes mellitus (HCC)    Diabetic retinopathy (HCC)    Fatty liver    per patient's wife   GERD (gastroesophageal reflux disease) 03/14/2022   no meds   Heart attack (HCC) 10/19/2010   History of prostate cancer    Hyperlipidemia    Hypertension, renal    Ocular hypertension    Prostate cancer (HCC) 2011   RTA (renal tubular acidosis)    Secondary hyperparathyroidism, renal (HCC)    Vitamin D deficiency    Past Surgical History:  Procedure Laterality Date   APPENDECTOMY     AV FISTULA PLACEMENT Left 03/29/2022   Procedure: LEFT ARM ARTERIOVENOUS (AV) FISTULA VERSUS ARTERIOVENOUS GRAFT CREATION;   Surgeon: Larina Earthly, MD;  Location: [facility] OR;  Service: Vascular;  Laterality: Left;   BACK SURGERY     BIOPSY  02/13/2018   Procedure: BIOPSY;  Surgeon: Shellia Cleverly, DO;  Location: [facility];  Service: Gastroenterology;;   COLONOSCOPY WITH PROPOFOL N/A 02/13/2018   Procedure: COLONOSCOPY WITH PROPOFOL;  Surgeon: Shellia Cleverly, DO;  Location: [facility];  Service: Gastroenterology;  Laterality: N/A;   CORONARY ANGIOPLASTY     by notes, LAD stent placed 10/2010 in setting of MI   ESOPHAGEAL DILATION  02/13/2018   Procedure: ESOPHAGEAL DILATION;  Surgeon: Shellia Cleverly, DO;  Location: [facility];  Service: Gastroenterology;;   ESOPHAGEAL MANOMETRY N/A 10/05/2022   Procedure: ESOPHAGEAL MANOMETRY (EM);  Surgeon: Shellia Cleverly, DO;  Location: [facility];  Service: Gastroenterology;  Laterality: N/A;   ESOPHAGOGASTRODUODENOSCOPY (EGD) WITH PROPOFOL N/A 02/13/2018   Procedure: ESOPHAGOGASTRODUODENOSCOPY (EGD) WITH PROPOFOL;  Surgeon: Shellia Cleverly, DO;  Location: [facility];  Service: Gastroenterology;  Laterality: N/A;   NASAL SEPTUM SURGERY  1981   PROSTATE SURGERY     QUADRICEPS TENDON REPAIR Right 02/16/2021   Procedure: RIGHT KNEE OPEN REPAIR QUADRICEP TENDON;  Surgeon: Yolonda Kida, MD;  Location: [facility] OR;  Service: Orthopedics;  Laterality: Right;  UPPER GI ENDOSCOPY  03/14/2022   VITRECTOMY  12/21/2006   Social History   Socioeconomic History   Marital status: Married    Spouse name: Not on file   Number of children: 0   Years of education: Not on file   Highest education level: Not on file  Occupational History   Not on file  Tobacco Use   Smoking status: Never   Smokeless tobacco: Never  Vaping Use   Vaping status: Never Used  Substance and Sexual Activity   Alcohol use: Not Currently   Drug use: Never   Sexual activity: Not on file  Other Topics Concern   Not on file  Social History Narrative   Not on file   Social  Determinants of Health   Financial Resource Strain: Not on file  Food Insecurity: Low Risk  (04/11/2022)   [facility]   Hunger Vital Sign    Worried About Running Out of Food in the Last Year: Never true    Within the past 12 months, the food you bought just didn't last and you didn't have money to get more: Not on file  Transportation Needs: Not on file (04/11/2022)  Physical Activity: Not on file  Stress: Not on file  Social Connections: Not on file   Family History  Problem Relation Age of Onset   Cancer Father        Prostate   Heart disease Father    Cirrhosis Maternal Grandmother    Colon cancer Neg Hx    Esophageal cancer Neg Hx    Allergies  Allergen Reactions   Cefazolin Anaphylaxis    Administered for surgical prophylaxis along with platelets and pt subsequently had presumed allergic reaction with hypotension, tachycardia elevate peak pressures   Current Outpatient Medications  Medication Sig Dispense Refill   traMADol (ULTRAM) 50 MG tablet Take 1 tablet (50 mg total) by mouth every 6 (six) hours as needed for up to 5 days. 20 tablet 0   acetaminophen (TYLENOL) 500 MG tablet Take 1,000 mg by mouth every 6 (six) hours as needed (pain.).     aspirin EC 81 MG tablet Take 81 mg by mouth at bedtime.     atorvastatin (LIPITOR) 10 MG tablet Take 1 tablet by mouth daily.     carvedilol (COREG) 3.125 MG tablet Take 3.125 mg by mouth 2 (two) times daily with a meal.     ezetimibe (ZETIA) 10 MG tablet Take 10 mg by mouth in the morning.     glucose blood (FREESTYLE TEST STRIPS) test strip 1 each by Other route 2 (two) times daily as needed for other. PRN 102 each 5   ibuprofen (ADVIL) 600 MG tablet Take 1 tablet (600 mg total) by mouth every 6 (six) hours as needed for mild pain. 30 tablet 0   insulin glargine (LANTUS) 100 UNIT/ML Solostar Pen Inject 48 Units into the skin at bedtime.     LYUMJEV KWIKPEN 100 UNIT/ML KwikPen Inject into the skin.      mycophenolate (MYFORTIC) 180 MG EC tablet Take 180 mg by mouth 2 (two) times daily.     nitroGLYCERIN (NITROSTAT) 0.4 MG SL tablet Place 0.4 mg under the tongue every 5 (five) minutes x 3 doses as needed for chest pain.     omeprazole (PRILOSEC) 20 MG capsule Take 20 mg by mouth daily.     OZEMPIC, 0.25 OR 0.5 MG/DOSE, 2 MG/3ML SOPN Inject 0.25 mg into the skin once a  week.     predniSONE (DELTASONE) 5 MG tablet Take 5 mg by mouth daily with breakfast.     predniSONE (DELTASONE) 5 MG tablet Take 1 tablet by mouth daily.     sulfamethoxazole-trimethoprim (BACTRIM) 400-80 MG tablet Take 1 tablet by mouth 3 (three) times a week.     tacrolimus (PROGRAF) 0.5 MG capsule Take 0.5 mg by mouth daily. Total a day is 1.5mg  (takes 1mg  BID and 0.5mg  once a day)     tacrolimus (PROGRAF) 1 MG capsule Take 1.5 mg by mouth 2 (two) times daily.     timolol (TIMOPTIC) 0.5 % ophthalmic solution Place 1 drop into the left eye 2 (two) times daily. 10 mL 2   No current facility-administered medications for this visit.   CT Hip Right Wo Contrast  Result Date: 01/09/2023 CLINICAL DATA:  Upper leg trauma, fall with abrasion to right hip and laceration to right side. EXAM: CT OF THE RIGHT HIP AND FEMUR WITHOUT CONTRAST TECHNIQUE: Multidetector CT imaging of the right hip was performed according to the standard protocol. Multiplanar CT image reconstructions were also generated. RADIATION DOSE REDUCTION: This exam was performed according to the departmental dose-optimization program which includes automated exposure control, adjustment of the mA and/or kV according to patient size and/or use of iterative reconstruction technique. COMPARISON:  01/09/2023. FINDINGS: Bones/Joint/Cartilage There is a minimally displaced comminuted fracture of the right acetabulum involving the posterior and anterior acetabular walls. There is cortical buckling involving the inferior pubic ramus on the right. The remaining bony structures are  intact. No dislocation is seen. Trace joint effusion is noted at the knee. Ligaments Suboptimally assessed by CT. Muscles and Tendons There is asymmetric enlargement of the internal obturator muscle on the right, possibly reflecting edema or hematoma. Soft tissues Vascular calcifications are noted. Strandy opacities are noted in the subcutaneous tissues over the right hip, likely contusion. A renal transplant is noted in the pelvis on the right. A small fat containing umbilical hernia is present. IMPRESSION: 1. Minimally displaced comminuted fracture of the acetabulum on the right involving the anterior and posterior walls. 2. Cortical buckling of the inferior pubic ramus on the right, possible nondisplaced fracture. Electronically Signed   By: Thornell Sartorius M.D.   On: 01/09/2023 21:44   CT FEMUR RIGHT WO CONTRAST  Result Date: 01/09/2023 CLINICAL DATA:  Upper leg trauma, fall with abrasion to right hip and laceration to right side. EXAM: CT OF THE RIGHT HIP AND FEMUR WITHOUT CONTRAST TECHNIQUE: Multidetector CT imaging of the right hip was performed according to the standard protocol. Multiplanar CT image reconstructions were also generated. RADIATION DOSE REDUCTION: This exam was performed according to the departmental dose-optimization program which includes automated exposure control, adjustment of the mA and/or kV according to patient size and/or use of iterative reconstruction technique. COMPARISON:  01/09/2023. FINDINGS: Bones/Joint/Cartilage There is a minimally displaced comminuted fracture of the right acetabulum involving the posterior and anterior acetabular walls. There is cortical buckling involving the inferior pubic ramus on the right. The remaining bony structures are intact. No dislocation is seen. Trace joint effusion is noted at the knee. Ligaments Suboptimally assessed by CT. Muscles and Tendons There is asymmetric enlargement of the internal obturator muscle on the right, possibly reflecting  edema or hematoma. Soft tissues Vascular calcifications are noted. Strandy opacities are noted in the subcutaneous tissues over the right hip, likely contusion. A renal transplant is noted in the pelvis on the right. A small fat containing umbilical hernia is  present. IMPRESSION: 1. Minimally displaced comminuted fracture of the acetabulum on the right involving the anterior and posterior walls. 2. Cortical buckling of the inferior pubic ramus on the right, possible nondisplaced fracture. Electronically Signed   By: Thornell Sartorius M.D.   On: 01/09/2023 21:44   CT Head Wo Contrast  Result Date: 01/09/2023 CLINICAL DATA:  Head trauma, minor (Age >= 65y); Neck trauma (Age >= 65y). EXAM: CT HEAD WITHOUT CONTRAST CT CERVICAL SPINE WITHOUT CONTRAST TECHNIQUE: Multidetector CT imaging of the head and cervical spine was performed following the standard protocol without intravenous contrast. Multiplanar CT image reconstructions of the cervical spine were also generated. RADIATION DOSE REDUCTION: This exam was performed according to the departmental dose-optimization program which includes automated exposure control, adjustment of the mA and/or kV according to patient size and/or use of iterative reconstruction technique. COMPARISON:  None available. FINDINGS: CT HEAD FINDINGS Brain: No acute hemorrhage. Cortical gray-white differentiation is preserved. Patchy hypoattenuation of the cerebral white matter, most consistent with mild chronic small-vessel disease. Prominence of the ventricles and sulci within normal limits for age. No extra-axial collection. Basilar cisterns are patent. Vascular: No hyperdense vessel or unexpected calcification. Skull: No calvarial fracture or suspicious bone lesion. Skull base is unremarkable. Sinuses/Orbits: Postoperative changes of prior left globe vitrectomy. Moderate mucosal disease in the right maxillary sinus. Other: Right parietal scalp hematoma. CT CERVICAL SPINE FINDINGS Alignment:  Normal. Skull base and vertebrae: No acute fracture. Normal craniocervical junction. No suspicious bone lesions. Soft tissues and spinal canal: No prevertebral fluid or swelling. No visible canal hematoma. Disc levels: Mild cervical spondylosis without high-grade spinal canal stenosis. Upper chest: No acute findings. Other: Atherosclerotic calcifications of the carotid bulbs. IMPRESSION: 1. No acute intracranial abnormality. Right parietal scalp hematoma. 2. No acute cervical spine fracture or traumatic listhesis. Electronically Signed   By: Orvan Falconer M.D.   On: 01/09/2023 16:37   CT Cervical Spine Wo Contrast  Result Date: 01/09/2023 CLINICAL DATA:  Head trauma, minor (Age >= 65y); Neck trauma (Age >= 65y). EXAM: CT HEAD WITHOUT CONTRAST CT CERVICAL SPINE WITHOUT CONTRAST TECHNIQUE: Multidetector CT imaging of the head and cervical spine was performed following the standard protocol without intravenous contrast. Multiplanar CT image reconstructions of the cervical spine were also generated. RADIATION DOSE REDUCTION: This exam was performed according to the departmental dose-optimization program which includes automated exposure control, adjustment of the mA and/or kV according to patient size and/or use of iterative reconstruction technique. COMPARISON:  None available. FINDINGS: CT HEAD FINDINGS Brain: No acute hemorrhage. Cortical gray-white differentiation is preserved. Patchy hypoattenuation of the cerebral white matter, most consistent with mild chronic small-vessel disease. Prominence of the ventricles and sulci within normal limits for age. No extra-axial collection. Basilar cisterns are patent. Vascular: No hyperdense vessel or unexpected calcification. Skull: No calvarial fracture or suspicious bone lesion. Skull base is unremarkable. Sinuses/Orbits: Postoperative changes of prior left globe vitrectomy. Moderate mucosal disease in the right maxillary sinus. Other: Right parietal scalp hematoma. CT  CERVICAL SPINE FINDINGS Alignment: Normal. Skull base and vertebrae: No acute fracture. Normal craniocervical junction. No suspicious bone lesions. Soft tissues and spinal canal: No prevertebral fluid or swelling. No visible canal hematoma. Disc levels: Mild cervical spondylosis without high-grade spinal canal stenosis. Upper chest: No acute findings. Other: Atherosclerotic calcifications of the carotid bulbs. IMPRESSION: 1. No acute intracranial abnormality. Right parietal scalp hematoma. 2. No acute cervical spine fracture or traumatic listhesis. Electronically Signed   By: Orvan Falconer M.D.   On:  01/09/2023 16:37    Review of Systems:   A ROS was performed including pertinent positives and negatives as documented in the HPI.  Physical Exam :   Constitutional: NAD and appears stated age Neurological: Alert and oriented Psych: Appropriate affect and cooperative There were no vitals taken for this visit.   Comprehensive Musculoskeletal Exam:    Tenderness palpation over the lateral right hip.  Gentle passive range of motion of the hip to 90 degrees flexion.  Knee flexion extension intact with adequate strength.  Distal neurosensory exam intact.  Imaging:   Xray (AP pelvis, right hip 2 views): Small lucency in the acetabulum suggesting possible fracture  CT (Right hip): Comminuted acetabular fracture with minimal displacement.    I personally reviewed and interpreted the radiographs.   Assessment:   67 y.o. male with a right comminuted acetabular fracture after a fall 2 days ago.  This does appear very minimally displaced and per review with Dr. Steward Drone will likely not require surgical intervention.  I will send him in some tramadol for pain control that he can use as needed.  Discussed that he needs to remain toe-touch weightbearing for the next 2 weeks at which point we will bring him back in for reassessment and consider further progression.  Plan :    - Toe-touch weight bearing  and return to clinic in 2 weeks for reassessment - Start Tramadol 50 mg as needed     I personally saw and evaluated the patient, and participated in the management and treatment plan.  Hazle Nordmann, PA-C Orthopedics

## 2023-01-24 ENCOUNTER — Telehealth: Payer: Self-pay | Admitting: Gastroenterology

## 2023-01-24 DIAGNOSIS — Z940 Kidney transplant status: Secondary | ICD-10-CM | POA: Diagnosis not present

## 2023-01-24 DIAGNOSIS — N184 Chronic kidney disease, stage 4 (severe): Secondary | ICD-10-CM | POA: Diagnosis not present

## 2023-01-24 DIAGNOSIS — Z5181 Encounter for therapeutic drug level monitoring: Secondary | ICD-10-CM | POA: Diagnosis not present

## 2023-01-24 DIAGNOSIS — D849 Immunodeficiency, unspecified: Secondary | ICD-10-CM | POA: Diagnosis not present

## 2023-01-24 DIAGNOSIS — Z79899 Other long term (current) drug therapy: Secondary | ICD-10-CM | POA: Diagnosis not present

## 2023-01-24 DIAGNOSIS — Z79621 Long term (current) use of calcineurin inhibitor: Secondary | ICD-10-CM | POA: Diagnosis not present

## 2023-01-24 DIAGNOSIS — Z4822 Encounter for aftercare following kidney transplant: Secondary | ICD-10-CM | POA: Diagnosis not present

## 2023-01-24 DIAGNOSIS — Z794 Long term (current) use of insulin: Secondary | ICD-10-CM | POA: Diagnosis not present

## 2023-01-24 DIAGNOSIS — D84821 Immunodeficiency due to drugs: Secondary | ICD-10-CM | POA: Diagnosis not present

## 2023-01-24 DIAGNOSIS — E1122 Type 2 diabetes mellitus with diabetic chronic kidney disease: Secondary | ICD-10-CM | POA: Diagnosis not present

## 2023-01-24 DIAGNOSIS — E1165 Type 2 diabetes mellitus with hyperglycemia: Secondary | ICD-10-CM | POA: Diagnosis not present

## 2023-01-24 NOTE — Telephone Encounter (Signed)
Inbound call from patient, stated he never received the results from his esophageal manometry. Patient states he was unsure if he needed to be seen again for a follow up or what his further plan of care would be. Would like to discuss with a nurse his results and plan of care.

## 2023-01-24 NOTE — Telephone Encounter (Signed)
Patient is advised of esophageal manometry results/recommendations as written by Dr Barron Alvine 10/2022 and displayed in [place]. Patient verbalizes understanding and thanked me for the call.

## 2023-01-25 ENCOUNTER — Ambulatory Visit (HOSPITAL_BASED_OUTPATIENT_CLINIC_OR_DEPARTMENT_OTHER): Payer: Medicare Other | Admitting: Student

## 2023-01-26 ENCOUNTER — Ambulatory Visit (HOSPITAL_BASED_OUTPATIENT_CLINIC_OR_DEPARTMENT_OTHER): Payer: Medicare Other | Admitting: Student

## 2023-01-26 ENCOUNTER — Ambulatory Visit (INDEPENDENT_AMBULATORY_CARE_PROVIDER_SITE_OTHER): Payer: Medicare Other

## 2023-01-26 ENCOUNTER — Encounter (HOSPITAL_BASED_OUTPATIENT_CLINIC_OR_DEPARTMENT_OTHER): Payer: Self-pay | Admitting: Student

## 2023-01-26 DIAGNOSIS — M85842 Other specified disorders of bone density and structure, left hand: Secondary | ICD-10-CM | POA: Diagnosis not present

## 2023-01-26 DIAGNOSIS — M79642 Pain in left hand: Secondary | ICD-10-CM | POA: Diagnosis not present

## 2023-01-26 DIAGNOSIS — S32401A Unspecified fracture of right acetabulum, initial encounter for closed fracture: Secondary | ICD-10-CM

## 2023-01-26 DIAGNOSIS — S62202A Unspecified fracture of first metacarpal bone, left hand, initial encounter for closed fracture: Secondary | ICD-10-CM | POA: Diagnosis not present

## 2023-01-26 DIAGNOSIS — S62232A Other displaced fracture of base of first metacarpal bone, left hand, initial encounter for closed fracture: Secondary | ICD-10-CM | POA: Diagnosis not present

## 2023-01-26 NOTE — Progress Notes (Signed)
Chief Complaint: Right hip and left hand pain     History of Present Illness:   01/26/23: Patient is here for 2-week follow-up of a right acetabular fracture.  States that he is overall doing some better.  He has remained toe-touch weightbearing.  Pain levels are generally mild.  He does note specific difficulty with getting in and out of a car.  Otherwise he has been getting around fairly well with assistance of a walker.  Has been taking tramadol occasionally before bedtime which is helping him sleep.  Does also note today some continued pain in his left hand, which at this point is moderate in severity and bothers him more than his hip.  His hand was also injured in the fall a few weeks ago.  There is still some bruising on the back of his hand toward the base of his thumb.  Denies any numbness or tingling.   01/11/2023: Brock Fanta is a 67 y.o. male presents today for further evaluation of his right hip.  Patient was seen in the emergency department on 01/09/2023 after a fall.  Patient states that he was walking between floor joists in a house while at work and took a bad step.  Evaluation in the emergency department with his right hip showed no bony injury on x-ray, however follow-up CT scan revealed a comminuted acetabular fracture.  He is given a prescription for oxycodone 5 mg, however has been unable to pick this up due to being out of stock.  He has been taking occasional ibuprofen, however he is not supposed to be taking this due to recent kidney transplant December.  States that pain levels are moderate, feel like a constant ache, and are worse on the lateral side.  Denies any numbness or tingling.  Has been nonweightbearing utilizing crutches.   Surgical History:   Right knee quad tendon repair - 2022 Right kidney transplant - 2023  PMH/PSH/Family History/Social History/Meds/Allergies:    Past Medical History:  Diagnosis Date   Anemia associated  with chronic renal failure    CKD (chronic kidney disease), stage V (HCC)    Coronary disease    Diabetic nephropathy associated with type 2 diabetes mellitus (HCC)    Diabetic retinopathy (HCC)    Fatty liver    per patient's wife   GERD (gastroesophageal reflux disease) 03/14/2022   no meds   Heart attack (HCC) 10/19/2010   History of prostate cancer    Hyperlipidemia    Hypertension, renal    Ocular hypertension    Prostate cancer (HCC) 2011   RTA (renal tubular acidosis)    Secondary hyperparathyroidism, renal (HCC)    Vitamin D deficiency    Past Surgical History:  Procedure Laterality Date   APPENDECTOMY     AV FISTULA PLACEMENT Left 03/29/2022   Procedure: LEFT ARM ARTERIOVENOUS (AV) FISTULA VERSUS ARTERIOVENOUS GRAFT CREATION;  Surgeon: Larina Earthly, MD;  Location: [facility] OR;  Service: Vascular;  Laterality: Left;   BACK SURGERY     BIOPSY  02/13/2018   Procedure: BIOPSY;  Surgeon: Shellia Cleverly, DO;  Location: [facility];  Service: Gastroenterology;;   COLONOSCOPY WITH PROPOFOL N/A 02/13/2018   Procedure: COLONOSCOPY WITH PROPOFOL;  Surgeon: Shellia Cleverly, DO;  Location: [facility];  Service: Gastroenterology;  Laterality: N/A;   CORONARY ANGIOPLASTY  by notes, LAD stent placed 10/2010 in setting of MI   ESOPHAGEAL DILATION  02/13/2018   Procedure: ESOPHAGEAL DILATION;  Surgeon: Shellia Cleverly, DO;  Location: [facility];  Service: Gastroenterology;;   ESOPHAGEAL MANOMETRY N/A 10/05/2022   Procedure: ESOPHAGEAL MANOMETRY (EM);  Surgeon: Shellia Cleverly, DO;  Location: [facility];  Service: Gastroenterology;  Laterality: N/A;   ESOPHAGOGASTRODUODENOSCOPY (EGD) WITH PROPOFOL N/A 02/13/2018   Procedure: ESOPHAGOGASTRODUODENOSCOPY (EGD) WITH PROPOFOL;  Surgeon: Shellia Cleverly, DO;  Location: [facility];  Service: Gastroenterology;  Laterality: N/A;   NASAL SEPTUM SURGERY  1981   PROSTATE SURGERY     QUADRICEPS TENDON REPAIR Right 02/16/2021    Procedure: RIGHT KNEE OPEN REPAIR QUADRICEP TENDON;  Surgeon: Yolonda Kida, MD;  Location: [facility] OR;  Service: Orthopedics;  Laterality: Right;   UPPER GI ENDOSCOPY  03/14/2022   VITRECTOMY  12/21/2006   Social History   Socioeconomic History   Marital status: Married    Spouse name: Not on file   Number of children: 0   Years of education: Not on file   Highest education level: Not on file  Occupational History   Not on file  Tobacco Use   Smoking status: Never   Smokeless tobacco: Never  Vaping Use   Vaping status: Never Used  Substance and Sexual Activity   Alcohol use: Not Currently   Drug use: Never   Sexual activity: Not on file  Other Topics Concern   Not on file  Social History Narrative   Not on file   Social Determinants of Health   Financial Resource Strain: Not on file  Food Insecurity: Low Risk  (04/11/2022)   [facility]   Hunger Vital Sign    Worried About Running Out of Food in the Last Year: Never true    Within the past 12 months, the food you bought just didn't last and you didn't have money to get more: Not on file  Transportation Needs: Not on file (04/11/2022)  Physical Activity: Not on file  Stress: Not on file  Social Connections: Not on file   Family History  Problem Relation Age of Onset   Cancer Father        Prostate   Heart disease Father    Cirrhosis Maternal Grandmother    Colon cancer Neg Hx    Esophageal cancer Neg Hx    Allergies  Allergen Reactions   Cefazolin Anaphylaxis    Administered for surgical prophylaxis along with platelets and pt subsequently had presumed allergic reaction with hypotension, tachycardia elevate peak pressures   Current Outpatient Medications  Medication Sig Dispense Refill   acetaminophen (TYLENOL) 500 MG tablet Take 1,000 mg by mouth every 6 (six) hours as needed (pain.).     aspirin EC 81 MG tablet Take 81 mg by mouth at bedtime.     atorvastatin (LIPITOR) 10  MG tablet Take 1 tablet by mouth daily.     carvedilol (COREG) 3.125 MG tablet Take 3.125 mg by mouth 2 (two) times daily with a meal.     ezetimibe (ZETIA) 10 MG tablet Take 10 mg by mouth in the morning.     glucose blood (FREESTYLE TEST STRIPS) test strip 1 each by Other route 2 (two) times daily as needed for other. PRN 102 each 5   ibuprofen (ADVIL) 600 MG tablet Take 1 tablet (600 mg total) by mouth every 6 (six) hours as needed for mild pain. 30 tablet 0  insulin glargine (LANTUS) 100 UNIT/ML Solostar Pen Inject 48 Units into the skin at bedtime.     LYUMJEV KWIKPEN 100 UNIT/ML KwikPen Inject into the skin.     mycophenolate (MYFORTIC) 180 MG EC tablet Take 180 mg by mouth 2 (two) times daily.     nitroGLYCERIN (NITROSTAT) 0.4 MG SL tablet Place 0.4 mg under the tongue every 5 (five) minutes x 3 doses as needed for chest pain.     omeprazole (PRILOSEC) 20 MG capsule Take 20 mg by mouth daily.     OZEMPIC, 0.25 OR 0.5 MG/DOSE, 2 MG/3ML SOPN Inject 0.25 mg into the skin once a week.     predniSONE (DELTASONE) 5 MG tablet Take 5 mg by mouth daily with breakfast.     predniSONE (DELTASONE) 5 MG tablet Take 1 tablet by mouth daily.     sulfamethoxazole-trimethoprim (BACTRIM) 400-80 MG tablet Take 1 tablet by mouth 3 (three) times a week.     tacrolimus (PROGRAF) 0.5 MG capsule Take 0.5 mg by mouth daily. Total a day is 1.5mg  (takes 1mg  BID and 0.5mg  once a day)     tacrolimus (PROGRAF) 1 MG capsule Take 1.5 mg by mouth 2 (two) times daily.     timolol (TIMOPTIC) 0.5 % ophthalmic solution Place 1 drop into the left eye 2 (two) times daily. 10 mL 2   No current facility-administered medications for this visit.   No results found.  Review of Systems:   A ROS was performed including pertinent positives and negatives as documented in the HPI.  Physical Exam :   Constitutional: NAD and appears stated age Neurological: Alert and oriented Psych: Appropriate affect and cooperative There were  no vitals taken for this visit.   Comprehensive Musculoskeletal Exam:    No significant tenderness to palpation over the right hip.  Passive range of motion of the right hip to 110 degrees flexion, 20 degrees external rotation, and 10 degrees internal rotation without discomfort.  Right knee flexion and extension strength is 5/5. Evaluation of the left hand reveals ecchymosis and mild edema over the dorsal hand at the base of the thumb.  Full range of motion with thumb flexion, extension, and opposition but this does cause some discomfort.  Able to form a fist with good strength.  No snuffbox tenderness.  Radial pulse 2+.  Neurosensory exam intact.  Imaging:   Xray (right hand 3 views): Transverse mildly angulated fracture of the base of the first metacarpal.  Previous surgical fixation of the second metacarpal.    I personally reviewed and interpreted the radiographs.   Assessment:   67 y.o. male 2-week status post right acetabular fracture.  He has been toe-touch weightbearing and overall is doing very well.  Given this, I would like to have him gradually progress weightbearing as tolerated over the next 2 weeks, up to 50%.  I would like to see him back at that time for reevaluation, repeat x-ray, and can consider progressing further as well as incorporating physical therapy.  He also reports continued pain in the left hand today.  X-rays do reveal a mildly displaced fracture of the first metacarpal base.  Given that he is needing to utilize a walker, I would like to place him into a removable thumb spica splint today as opposed to casting.  Will plan to follow-up on this in 2 weeks as well.  Plan :    -Slowly progress right-sided weightbearing as tolerated up to 50% -Removable thumb spica splint given today -  Return to clinic in 2 weeks for reevaluation    I personally saw and evaluated the patient, and participated in the management and treatment plan.  Hazle Nordmann,  PA-C Orthopedics

## 2023-02-09 ENCOUNTER — Ambulatory Visit (HOSPITAL_BASED_OUTPATIENT_CLINIC_OR_DEPARTMENT_OTHER): Payer: Medicare Other | Admitting: Student

## 2023-02-09 ENCOUNTER — Ambulatory Visit (INDEPENDENT_AMBULATORY_CARE_PROVIDER_SITE_OTHER): Payer: Medicare Other

## 2023-02-09 ENCOUNTER — Encounter (HOSPITAL_BASED_OUTPATIENT_CLINIC_OR_DEPARTMENT_OTHER): Payer: Self-pay | Admitting: Student

## 2023-02-09 DIAGNOSIS — S32401D Unspecified fracture of right acetabulum, subsequent encounter for fracture with routine healing: Secondary | ICD-10-CM

## 2023-02-09 DIAGNOSIS — S62232A Other displaced fracture of base of first metacarpal bone, left hand, initial encounter for closed fracture: Secondary | ICD-10-CM

## 2023-02-09 DIAGNOSIS — S62232D Other displaced fracture of base of first metacarpal bone, left hand, subsequent encounter for fracture with routine healing: Secondary | ICD-10-CM

## 2023-02-09 DIAGNOSIS — M85842 Other specified disorders of bone density and structure, left hand: Secondary | ICD-10-CM | POA: Diagnosis not present

## 2023-02-09 DIAGNOSIS — S62202A Unspecified fracture of first metacarpal bone, left hand, initial encounter for closed fracture: Secondary | ICD-10-CM | POA: Diagnosis not present

## 2023-02-09 NOTE — Progress Notes (Signed)
Chief Complaint: Right hip and left hand pain     History of Present Illness:   02/09/23: Patient is here today for follow-up of fractures to the right acetabulum and left first metacarpal that occurred due to a fall on 01/09/2023.  He reports that his hip is feeling much better with no pain.  He has mainly been walking with assistance of 1 crutch, however has been able to walk some at home without any assistance.  Is having some discomfort in the right knee, which he did have a quad tendon repair performed 2 years ago.  In regards to the left hand, he does still report some mild and constant aching pain.  Has been wearing the wrist brace majority of the time.  Does report good movement and use of the thumb.   01/26/2023: Patient is here for 2-week follow-up of a right acetabular fracture.  States that he is overall doing some better.  He has remained toe-touch weightbearing.  Pain levels are generally mild.  He does note specific difficulty with getting in and out of a car.  Otherwise he has been getting around fairly well with assistance of a walker.  Has been taking tramadol occasionally before bedtime which is helping him sleep.  Does also note today some continued pain in his left hand, which at this point is moderate in severity and bothers him more than his hip.  His hand was also injured in the fall a few weeks ago.  There is still some bruising on the back of his hand toward the base of his thumb.  Denies any numbness or tingling.   Surgical History:   Right knee quad tendon repair - 2022 Right kidney transplant - 2023  PMH/PSH/Family History/Social History/Meds/Allergies:    Past Medical History:  Diagnosis Date   Anemia associated with chronic renal failure    CKD (chronic kidney disease), stage V (HCC)    Coronary disease    Diabetic nephropathy associated with type 2 diabetes mellitus (HCC)    Diabetic retinopathy (HCC)    Fatty liver    per  patient's wife   GERD (gastroesophageal reflux disease) 03/14/2022   no meds   Heart attack (HCC) 10/19/2010   History of prostate cancer    Hyperlipidemia    Hypertension, renal    Ocular hypertension    Prostate cancer (HCC) 2011   RTA (renal tubular acidosis)    Secondary hyperparathyroidism, renal (HCC)    Vitamin D deficiency    Past Surgical History:  Procedure Laterality Date   APPENDECTOMY     AV FISTULA PLACEMENT Left 03/29/2022   Procedure: LEFT ARM ARTERIOVENOUS (AV) FISTULA VERSUS ARTERIOVENOUS GRAFT CREATION;  Surgeon: Larina Earthly, MD;  Location: [facility] OR;  Service: Vascular;  Laterality: Left;   BACK SURGERY     BIOPSY  02/13/2018   Procedure: BIOPSY;  Surgeon: Shellia Cleverly, DO;  Location: [facility];  Service: Gastroenterology;;   COLONOSCOPY WITH PROPOFOL N/A 02/13/2018   Procedure: COLONOSCOPY WITH PROPOFOL;  Surgeon: Shellia Cleverly, DO;  Location: [facility];  Service: Gastroenterology;  Laterality: N/A;   CORONARY ANGIOPLASTY     by notes, LAD stent placed 10/2010 in setting of MI   ESOPHAGEAL DILATION  02/13/2018   Procedure: ESOPHAGEAL DILATION;  Surgeon: Shellia Cleverly, DO;  Location:  [facility];  Service: Gastroenterology;;   ESOPHAGEAL MANOMETRY N/A 10/05/2022   Procedure: ESOPHAGEAL MANOMETRY (EM);  Surgeon: Shellia Cleverly, DO;  Location: [facility];  Service: Gastroenterology;  Laterality: N/A;   ESOPHAGOGASTRODUODENOSCOPY (EGD) WITH PROPOFOL N/A 02/13/2018   Procedure: ESOPHAGOGASTRODUODENOSCOPY (EGD) WITH PROPOFOL;  Surgeon: Shellia Cleverly, DO;  Location: [facility];  Service: Gastroenterology;  Laterality: N/A;   NASAL SEPTUM SURGERY  1981   PROSTATE SURGERY     QUADRICEPS TENDON REPAIR Right 02/16/2021   Procedure: RIGHT KNEE OPEN REPAIR QUADRICEP TENDON;  Surgeon: Yolonda Kida, MD;  Location: [facility] OR;  Service: Orthopedics;  Laterality: Right;   UPPER GI ENDOSCOPY  03/14/2022   VITRECTOMY  12/21/2006   Social  History   Socioeconomic History   Marital status: Married    Spouse name: Not on file   Number of children: 0   Years of education: Not on file   Highest education level: Not on file  Occupational History   Not on file  Tobacco Use   Smoking status: Never   Smokeless tobacco: Never  Vaping Use   Vaping status: Never Used  Substance and Sexual Activity   Alcohol use: Not Currently   Drug use: Never   Sexual activity: Not on file  Other Topics Concern   Not on file  Social History Narrative   Not on file   Social Determinants of Health   Financial Resource Strain: Not on file  Food Insecurity: Low Risk  (04/11/2022)   [facility]   Hunger Vital Sign    Worried About Running Out of Food in the Last Year: Never true    Within the past 12 months, the food you bought just didn't last and you didn't have money to get more: Not on file  Transportation Needs: Not on file (04/11/2022)  Physical Activity: Not on file  Stress: Not on file  Social Connections: Not on file   Family History  Problem Relation Age of Onset   Cancer Father        Prostate   Heart disease Father    Cirrhosis Maternal Grandmother    Colon cancer Neg Hx    Esophageal cancer Neg Hx    Allergies  Allergen Reactions   Cefazolin Anaphylaxis    Administered for surgical prophylaxis along with platelets and pt subsequently had presumed allergic reaction with hypotension, tachycardia elevate peak pressures   Current Outpatient Medications  Medication Sig Dispense Refill   acetaminophen (TYLENOL) 500 MG tablet Take 1,000 mg by mouth every 6 (six) hours as needed (pain.).     aspirin EC 81 MG tablet Take 81 mg by mouth at bedtime.     atorvastatin (LIPITOR) 10 MG tablet Take 1 tablet by mouth daily.     carvedilol (COREG) 3.125 MG tablet Take 3.125 mg by mouth 2 (two) times daily with a meal.     ezetimibe (ZETIA) 10 MG tablet Take 10 mg by mouth in the morning.     glucose  blood (FREESTYLE TEST STRIPS) test strip 1 each by Other route 2 (two) times daily as needed for other. PRN 102 each 5   ibuprofen (ADVIL) 600 MG tablet Take 1 tablet (600 mg total) by mouth every 6 (six) hours as needed for mild pain. 30 tablet 0   insulin glargine (LANTUS) 100 UNIT/ML Solostar Pen Inject 48 Units into the skin at bedtime.     LYUMJEV KWIKPEN 100 UNIT/ML KwikPen Inject into the skin.  mycophenolate (MYFORTIC) 180 MG EC tablet Take 180 mg by mouth 2 (two) times daily.     nitroGLYCERIN (NITROSTAT) 0.4 MG SL tablet Place 0.4 mg under the tongue every 5 (five) minutes x 3 doses as needed for chest pain.     omeprazole (PRILOSEC) 20 MG capsule Take 20 mg by mouth daily.     OZEMPIC, 0.25 OR 0.5 MG/DOSE, 2 MG/3ML SOPN Inject 0.25 mg into the skin once a week.     predniSONE (DELTASONE) 5 MG tablet Take 5 mg by mouth daily with breakfast.     predniSONE (DELTASONE) 5 MG tablet Take 1 tablet by mouth daily.     sulfamethoxazole-trimethoprim (BACTRIM) 400-80 MG tablet Take 1 tablet by mouth 3 (three) times a week.     tacrolimus (PROGRAF) 0.5 MG capsule Take 0.5 mg by mouth daily. Total a day is 1.5mg  (takes 1mg  BID and 0.5mg  once a day)     tacrolimus (PROGRAF) 1 MG capsule Take 1.5 mg by mouth 2 (two) times daily.     timolol (TIMOPTIC) 0.5 % ophthalmic solution Place 1 drop into the left eye 2 (two) times daily. 10 mL 2   No current facility-administered medications for this visit.   No results found.  Review of Systems:   A ROS was performed including pertinent positives and negatives as documented in the HPI.  Physical Exam :   Constitutional: NAD and appears stated age Neurological: Alert and oriented Psych: Appropriate affect and cooperative There were no vitals taken for this visit.   Comprehensive Musculoskeletal Exam:    Passive range of motion of the right hip to 120 degrees flexion, 30 degrees external rotation, 10 degrees internal rotation.  No tenderness  palpation about the right knee.  Knee extension and flexion strength is 5/5. Some tenderness noted at the left first [facility] joint.  Normal ROM of the thumb with flexion, extension, and opposition.  Minimal ecchymosis noted over the dorsal [facility] joint.  Radial pulse 2+.  Neurosensory exam is intact.  Imaging:   Xray (right hand 3 views): Healing fracture of the first metacarpal proximal metaphysis with peripheral callus formation   I personally reviewed and interpreted the radiographs.   Assessment:   67 y.o. male now 4 weeks status post a fall at work which she sustained fractures to the right acetabulum and left first metacarpal base.  He has no pain in the hip and has been tolerating gradual progression of weightbearing extremely well.  At this point I would like to begin progressing back to full weightbearing as tolerated.  He has been noting some general balance issues which could have contributed to his fall, so I would like to get him into physical therapy to help address these.  His thumb fracture does show evidence of callus formation on today's x-rays.  I would like him to remain in the splint for a few more weeks to ensure adequate healing.  I will plan to see him back in another 4 weeks for reassessment.  Plan :    -Referral to physical therapy for progression of weightbearing, gait training, and balance -Remain in thumb spica splint -Return to clinic in 4 weeks for reassessment    I personally saw and evaluated the patient, and participated in the management and treatment plan.  Hazle Nordmann, PA-C Orthopedics

## 2023-02-13 DIAGNOSIS — E1122 Type 2 diabetes mellitus with diabetic chronic kidney disease: Secondary | ICD-10-CM | POA: Diagnosis not present

## 2023-02-13 DIAGNOSIS — E1165 Type 2 diabetes mellitus with hyperglycemia: Secondary | ICD-10-CM | POA: Diagnosis not present

## 2023-02-13 DIAGNOSIS — E782 Mixed hyperlipidemia: Secondary | ICD-10-CM | POA: Diagnosis not present

## 2023-02-13 DIAGNOSIS — N184 Chronic kidney disease, stage 4 (severe): Secondary | ICD-10-CM | POA: Diagnosis not present

## 2023-02-14 ENCOUNTER — Encounter: Payer: Self-pay | Admitting: Internal Medicine

## 2023-02-14 ENCOUNTER — Ambulatory Visit: Payer: Medicare Other | Admitting: Internal Medicine

## 2023-02-14 VITALS — BP 128/78 | HR 90 | Temp 98.3°F | Resp 17 | Ht 70.0 in | Wt 166.8 lb

## 2023-02-14 DIAGNOSIS — Z23 Encounter for immunization: Secondary | ICD-10-CM | POA: Diagnosis not present

## 2023-02-14 DIAGNOSIS — Z940 Kidney transplant status: Secondary | ICD-10-CM | POA: Diagnosis not present

## 2023-02-14 DIAGNOSIS — I10 Essential (primary) hypertension: Secondary | ICD-10-CM | POA: Diagnosis not present

## 2023-02-14 DIAGNOSIS — E1165 Type 2 diabetes mellitus with hyperglycemia: Secondary | ICD-10-CM

## 2023-02-14 DIAGNOSIS — S32401D Unspecified fracture of right acetabulum, subsequent encounter for fracture with routine healing: Secondary | ICD-10-CM

## 2023-02-14 NOTE — Assessment & Plan Note (Signed)
Nephrology had a discussion with him regarding his elevated A1c.  I had another frank discussion that if he does not control his diabetes, he is not going to be around very long.  He states he will takes his meds as directed.  I reviewed his endocrinology notes and his sliding scale.

## 2023-02-14 NOTE — Assessment & Plan Note (Signed)
He is waiting on PT referral.  He will follow up with ortho as directed.

## 2023-02-14 NOTE — Assessment & Plan Note (Signed)
I reviewed his notes and labs from the transplant team where his creatinine was stable at 1.9.  He will continue to followup with them for his regular lab work.  He is to avoid NSAIDS.

## 2023-02-14 NOTE — Assessment & Plan Note (Signed)
His BP is controlled today.  We will continue his current medications.

## 2023-02-14 NOTE — Progress Notes (Signed)
Office Visit  Subjective   Patient ID: Jason Zimmerman   DOB: [birth year]   Age: 67 y.o.   MRN: [id number]   Chief Complaint Chief Complaint  Patient presents with   Annual Exam     History of Present Illness The patient is not here for an annual wellness exam as he had one done in 05/2022.  The patient is a 67 year old Caucasian/White male who returns for a follow-up visit for his T2 diabetes.  He was diagnosed with diabetes in 2004.  Over the interim, he did seen endocrinology yesterday on 02/13/2023 and his HgBA1c was 13.7%.  He admitted to noncompliance and not taking his SSI meal time coverage.  He also ran out of ozempic 3 weeks ago.  They recommended him taking his ozempic regularly and to take Lyumjev 3 times per day.   He remains lantus 80 Units daily, lyumjev sliding scale and ozempic 0.5 mg subcut weekly.  He is not walking as much as they would like.  He specifically denies unexplained abdominal pain, nausea or vomiting, or documented hypoglycemia. He uses the [employer] CGM and his sugars range somewhere between 150-400's.  His last HgbA1c was done on 10/25/2022 and was 12.7%. He does have long term complications of CAD/PVD, diabetic retinopathy and diabetic nephropathy but no diabetic neuropathy.  His last dilated eye exam by Dr. Luciana Axe in [place] was done on 10/25/2021 where they noted his right eye had moderate proliferative reinoatphy and macular edema.  He did see Dr. Luciana Axe about 3 months ago for his yearly eye exam but I do not have this report.   The patient has a history of ESRD secondary to T2 diabetes and HTN where he underwent renal transplantation in 04/10/2022.  His last visit with the transplant team was on 01/24/2023.  They noted earlier in the year that he had some thrombocytopenia when he was in the office.  He had a blood transfusion in 04/2022 and a platelet transfusion at that time as well.   They have been checking his prograf levels and these look good and  they felt his tranplant was stable.  He remains on prednisone 5mg  daily and prograf.  His neprhologist is Dr. Sabra Heck at [facility].  The patient continues to avoid NSAIDS at this time.  The transplant team also has noted that he has had elevated LFT's in the past.  He is on a statin, bactrim, fluconzaole initially on hold, but Bactrim re-started at prior visit.  He had a liver US at [facility] showing steatosis. Viral hepatitis titers were negative.  They have been following his liver enzymes where his last liver enzymes were done on 01/24/2023 and this showed an AST of 47 and ALT of 52.   The patient is a 66 year old Caucasian/White male who presents for a follow-up evaluation of hypertension.  This past year, we have noted some elevated blood pressures.  The patient has been checking his blood pressure at home.  The patient states his systolic BP runs in the 120's.  The patient's current medications include: coreg 3.125mg  po BID. The patient has been tolerating his medications well. The patient denies any headache, visual changes, dizziness, lightheadness, chest pain, shortness of breath, weakness/numbness, and edema. He reports there have been no other symptoms noted.    Also over the interim, he sustained fractures to the right acetabulum and left first metacarpal base on 01/09/2023. They have been slowly progressing his weight bearing status and  he has mainly been walking with assistance of 1 crutch, however has been able to walk some at home without any assistance.  Ortho has seen him last week and has referred him to physical therapy for progression of weightbearing, gait training, and balance.  They will see him back in clinic in 4 weeks for reassessment.       Past Medical History Past Medical History:  Diagnosis Date   Anemia associated with chronic renal failure    CKD (chronic kidney disease), stage V (HCC)    Coronary disease    Diabetic nephropathy associated with type 2 diabetes  mellitus (HCC)    Diabetic retinopathy (HCC)    Fatty liver    per patient's wife   GERD (gastroesophageal reflux disease) 03/14/2022   no meds   Heart attack (HCC) 10/19/2010   History of prostate cancer    Hyperlipidemia    Hypertension, renal    Ocular hypertension    Prostate cancer (HCC) 2011   RTA (renal tubular acidosis)    Secondary hyperparathyroidism, renal (HCC)    Vitamin D deficiency      Allergies Allergies  Allergen Reactions   Cefazolin Anaphylaxis    Administered for surgical prophylaxis along with platelets and pt subsequently had presumed allergic reaction with hypotension, tachycardia elevate peak pressures     Medications  Current Outpatient Medications:    acetaminophen (TYLENOL) 500 MG tablet, Take 1,000 mg by mouth every 6 (six) hours as needed (pain.)., Disp: , Rfl:    aspirin EC 81 MG tablet, Take 81 mg by mouth at bedtime., Disp: , Rfl:    atorvastatin (LIPITOR) 10 MG tablet, Take 1 tablet by mouth daily., Disp: , Rfl:    carvedilol (COREG) 3.125 MG tablet, Take 3.125 mg by mouth 2 (two) times daily with a meal., Disp: , Rfl:    ezetimibe (ZETIA) 10 MG tablet, Take 10 mg by mouth in the morning., Disp: , Rfl:    glucose blood (FREESTYLE TEST STRIPS) test strip, 1 each by Other route 2 (two) times daily as needed for other. PRN, Disp: 102 each, Rfl: 5   ibuprofen (ADVIL) 600 MG tablet, Take 1 tablet (600 mg total) by mouth every 6 (six) hours as needed for mild pain., Disp: 30 tablet, Rfl: 0   insulin glargine (LANTUS) 100 UNIT/ML Solostar Pen, Inject 48 Units into the skin at bedtime., Disp: , Rfl:    LYUMJEV KWIKPEN 100 UNIT/ML KwikPen, Inject into the skin., Disp: , Rfl:    mycophenolate (MYFORTIC) 180 MG EC tablet, Take 180 mg by mouth 2 (two) times daily., Disp: , Rfl:    nitroGLYCERIN (NITROSTAT) 0.4 MG SL tablet, Place 0.4 mg under the tongue every 5 (five) minutes x 3 doses as needed for chest pain., Disp: , Rfl:    omeprazole (PRILOSEC) 20 MG  capsule, Take 20 mg by mouth daily., Disp: , Rfl:    OZEMPIC, 0.25 OR 0.5 MG/DOSE, 2 MG/3ML SOPN, Inject 0.25 mg into the skin once a week., Disp: , Rfl:    predniSONE (DELTASONE) 5 MG tablet, Take 5 mg by mouth daily with breakfast., Disp: , Rfl:    predniSONE (DELTASONE) 5 MG tablet, Take 1 tablet by mouth daily., Disp: , Rfl:    sulfamethoxazole-trimethoprim (BACTRIM) 400-80 MG tablet, Take 1 tablet by mouth 3 (three) times a week., Disp: , Rfl:    tacrolimus (PROGRAF) 0.5 MG capsule, Take 0.5 mg by mouth daily. Total a day is 1.5mg  (takes 1mg  BID and 0.5mg   once a day), Disp: , Rfl:    tacrolimus (PROGRAF) 1 MG capsule, Take 1.5 mg by mouth 2 (two) times daily., Disp: , Rfl:    timolol (TIMOPTIC) 0.5 % ophthalmic solution, Place 1 drop into the left eye 2 (two) times daily., Disp: 10 mL, Rfl: 2   Review of Systems Review of Systems  Constitutional:  Negative for chills, fever, malaise/fatigue and weight loss.  Eyes:  Negative for blurred vision and double vision.  Respiratory:  Negative for cough and shortness of breath.   Cardiovascular:  Negative for chest pain, palpitations and leg swelling.  Gastrointestinal:  Negative for abdominal pain, constipation, diarrhea, heartburn, nausea and vomiting.  Genitourinary:  Negative for frequency.  Musculoskeletal:  Negative for myalgias.  Skin:  Negative for itching and rash.  Neurological:  Negative for dizziness, weakness and headaches.  Endo/Heme/Allergies:  Positive for polydipsia.       Objective:    Vitals BP 128/78   Pulse 90   Temp 98.3 F (36.8 C)   Resp 17   Ht 5\' 10"  (1.778 m)   Wt 166 lb 12.8 oz (75.7 kg)   SpO2 97%   BMI 23.93 kg/m    Physical Examination Physical Exam Constitutional:      Appearance: Normal appearance. He is not ill-appearing.  Cardiovascular:     Rate and Rhythm: Normal rate and regular rhythm.     Pulses: Normal pulses.     Heart sounds: No murmur heard.    No friction rub. No gallop.   Pulmonary:     Effort: Pulmonary effort is normal. No respiratory distress.     Breath sounds: No wheezing, rhonchi or rales.  Abdominal:     General: Abdomen is flat. Bowel sounds are normal. There is no distension.     Palpations: Abdomen is soft.     Tenderness: There is no abdominal tenderness.  Musculoskeletal:     Right lower leg: No edema.     Left lower leg: No edema.  Skin:    General: Skin is warm and dry.     Findings: No rash.  Neurological:     General: No focal deficit present.     Mental Status: He is alert and oriented to person, place, and time.  Psychiatric:        Mood and Affect: Mood normal.        Behavior: Behavior normal.        Assessment & Plan:   Essential hypertension His BP is controlled today.  We will continue his current medications.  Poorly controlled diabetes mellitus [facility]) Nephrology had a discussion with him regarding his elevated A1c.  I had another frank discussion that if he does not control his diabetes, he is not going to be around very long.  He states he will takes his meds as directed.  I reviewed his endocrinology notes and his sliding scale.  Closed nondisplaced fracture of right acetabulum with routine healing He is waiting on PT referral.  He will follow up with ortho as directed.  Kidney transplanted I reviewed his notes and labs from the transplant team where his creatinine was stable at 1.9.  He will continue to followup with them for his regular lab work.  He is to avoid NSAIDS.    Return in about 3 months (around 05/17/2023).   Crist Fat, MD

## 2023-02-14 NOTE — Addendum Note (Signed)
Addended byOda Cogan on: 02/14/2023 02:35 PM   Modules accepted: Orders

## 2023-02-23 DIAGNOSIS — M25551 Pain in right hip: Secondary | ICD-10-CM | POA: Diagnosis not present

## 2023-02-28 DIAGNOSIS — Z940 Kidney transplant status: Secondary | ICD-10-CM | POA: Diagnosis not present

## 2023-02-28 DIAGNOSIS — Z79621 Long term (current) use of calcineurin inhibitor: Secondary | ICD-10-CM | POA: Diagnosis not present

## 2023-02-28 DIAGNOSIS — Z5181 Encounter for therapeutic drug level monitoring: Secondary | ICD-10-CM | POA: Diagnosis not present

## 2023-02-28 DIAGNOSIS — R7989 Other specified abnormal findings of blood chemistry: Secondary | ICD-10-CM | POA: Diagnosis not present

## 2023-03-09 ENCOUNTER — Telehealth: Payer: Self-pay | Admitting: Gastroenterology

## 2023-03-09 NOTE — Telephone Encounter (Signed)
Patient called stated he recently had a MRI,the results stated that he had a spot on his liver. Requested a call back from the nurse. Please advise.

## 2023-03-09 NOTE — Telephone Encounter (Signed)
Patient states that his kidney transplant physician ([facility]) saw him recently and asked that he follow up with GI regarding continued elevated LFT's and return of his dysphagia.   Patient with hx steatosis; 05/13/22 GI office note indicates possible additional testing needed.  Patient has been scheduled to see Dr Barron Alvine 04/19/23. He verbalizes understanding.

## 2023-03-14 ENCOUNTER — Ambulatory Visit (HOSPITAL_BASED_OUTPATIENT_CLINIC_OR_DEPARTMENT_OTHER): Payer: Medicare Other | Admitting: Student

## 2023-03-27 DIAGNOSIS — H2511 Age-related nuclear cataract, right eye: Secondary | ICD-10-CM | POA: Diagnosis not present

## 2023-03-27 DIAGNOSIS — Z79621 Long term (current) use of calcineurin inhibitor: Secondary | ICD-10-CM | POA: Diagnosis not present

## 2023-03-27 DIAGNOSIS — N189 Chronic kidney disease, unspecified: Secondary | ICD-10-CM | POA: Diagnosis not present

## 2023-03-27 DIAGNOSIS — Z8546 Personal history of malignant neoplasm of prostate: Secondary | ICD-10-CM | POA: Diagnosis not present

## 2023-03-27 DIAGNOSIS — M25551 Pain in right hip: Secondary | ICD-10-CM | POA: Diagnosis not present

## 2023-03-27 DIAGNOSIS — Z940 Kidney transplant status: Secondary | ICD-10-CM | POA: Diagnosis not present

## 2023-03-27 DIAGNOSIS — I129 Hypertensive chronic kidney disease with stage 1 through stage 4 chronic kidney disease, or unspecified chronic kidney disease: Secondary | ICD-10-CM | POA: Diagnosis not present

## 2023-03-27 DIAGNOSIS — D62 Acute posthemorrhagic anemia: Secondary | ICD-10-CM | POA: Diagnosis not present

## 2023-03-27 DIAGNOSIS — H3342 Traction detachment of retina, left eye: Secondary | ICD-10-CM | POA: Diagnosis not present

## 2023-03-27 DIAGNOSIS — Z794 Long term (current) use of insulin: Secondary | ICD-10-CM | POA: Diagnosis not present

## 2023-03-27 DIAGNOSIS — Z7952 Long term (current) use of systemic steroids: Secondary | ICD-10-CM | POA: Diagnosis not present

## 2023-03-27 DIAGNOSIS — W19XXXA Unspecified fall, initial encounter: Secondary | ICD-10-CM | POA: Diagnosis not present

## 2023-03-27 DIAGNOSIS — N289 Disorder of kidney and ureter, unspecified: Secondary | ICD-10-CM | POA: Diagnosis not present

## 2023-03-27 DIAGNOSIS — Z7985 Long-term (current) use of injectable non-insulin antidiabetic drugs: Secondary | ICD-10-CM | POA: Diagnosis not present

## 2023-03-27 DIAGNOSIS — I10 Essential (primary) hypertension: Secondary | ICD-10-CM | POA: Diagnosis not present

## 2023-03-27 DIAGNOSIS — Z79899 Other long term (current) drug therapy: Secondary | ICD-10-CM | POA: Diagnosis not present

## 2023-03-27 DIAGNOSIS — E7800 Pure hypercholesterolemia, unspecified: Secondary | ICD-10-CM | POA: Diagnosis not present

## 2023-03-27 DIAGNOSIS — E1122 Type 2 diabetes mellitus with diabetic chronic kidney disease: Secondary | ICD-10-CM | POA: Diagnosis not present

## 2023-03-27 DIAGNOSIS — K219 Gastro-esophageal reflux disease without esophagitis: Secondary | ICD-10-CM | POA: Diagnosis not present

## 2023-03-27 DIAGNOSIS — M84651A Pathological fracture in other disease, right femur, initial encounter for fracture: Secondary | ICD-10-CM | POA: Diagnosis not present

## 2023-03-27 DIAGNOSIS — I2510 Atherosclerotic heart disease of native coronary artery without angina pectoris: Secondary | ICD-10-CM | POA: Diagnosis not present

## 2023-03-27 DIAGNOSIS — Z7982 Long term (current) use of aspirin: Secondary | ICD-10-CM | POA: Diagnosis not present

## 2023-03-27 DIAGNOSIS — S72011A Unspecified intracapsular fracture of right femur, initial encounter for closed fracture: Secondary | ICD-10-CM | POA: Diagnosis not present

## 2023-03-27 DIAGNOSIS — I252 Old myocardial infarction: Secondary | ICD-10-CM | POA: Diagnosis not present

## 2023-03-27 DIAGNOSIS — M1611 Unilateral primary osteoarthritis, right hip: Secondary | ICD-10-CM | POA: Diagnosis not present

## 2023-03-27 DIAGNOSIS — H211X2 Other vascular disorders of iris and ciliary body, left eye: Secondary | ICD-10-CM | POA: Diagnosis not present

## 2023-03-27 DIAGNOSIS — W010XXA Fall on same level from slipping, tripping and stumbling without subsequent striking against object, initial encounter: Secondary | ICD-10-CM | POA: Diagnosis not present

## 2023-03-27 DIAGNOSIS — S72001A Fracture of unspecified part of neck of right femur, initial encounter for closed fracture: Secondary | ICD-10-CM | POA: Diagnosis not present

## 2023-03-27 DIAGNOSIS — E113411 Type 2 diabetes mellitus with severe nonproliferative diabetic retinopathy with macular edema, right eye: Secondary | ICD-10-CM | POA: Diagnosis not present

## 2023-03-28 DIAGNOSIS — D62 Acute posthemorrhagic anemia: Secondary | ICD-10-CM | POA: Diagnosis not present

## 2023-03-28 DIAGNOSIS — Z940 Kidney transplant status: Secondary | ICD-10-CM | POA: Diagnosis not present

## 2023-03-28 DIAGNOSIS — S72011A Unspecified intracapsular fracture of right femur, initial encounter for closed fracture: Secondary | ICD-10-CM | POA: Diagnosis not present

## 2023-03-28 DIAGNOSIS — W19XXXA Unspecified fall, initial encounter: Secondary | ICD-10-CM | POA: Diagnosis not present

## 2023-03-28 DIAGNOSIS — S72001A Fracture of unspecified part of neck of right femur, initial encounter for closed fracture: Secondary | ICD-10-CM | POA: Diagnosis not present

## 2023-03-28 HISTORY — PX: PARTIAL HIP ARTHROPLASTY: SHX733

## 2023-03-29 DIAGNOSIS — D62 Acute posthemorrhagic anemia: Secondary | ICD-10-CM | POA: Diagnosis not present

## 2023-03-29 DIAGNOSIS — S72011A Unspecified intracapsular fracture of right femur, initial encounter for closed fracture: Secondary | ICD-10-CM | POA: Diagnosis not present

## 2023-03-29 DIAGNOSIS — Z940 Kidney transplant status: Secondary | ICD-10-CM | POA: Diagnosis not present

## 2023-03-30 DIAGNOSIS — Z940 Kidney transplant status: Secondary | ICD-10-CM | POA: Diagnosis not present

## 2023-03-30 DIAGNOSIS — S72011A Unspecified intracapsular fracture of right femur, initial encounter for closed fracture: Secondary | ICD-10-CM | POA: Diagnosis not present

## 2023-03-30 DIAGNOSIS — D62 Acute posthemorrhagic anemia: Secondary | ICD-10-CM | POA: Diagnosis not present

## 2023-03-31 DIAGNOSIS — S72011A Unspecified intracapsular fracture of right femur, initial encounter for closed fracture: Secondary | ICD-10-CM | POA: Diagnosis not present

## 2023-03-31 DIAGNOSIS — D62 Acute posthemorrhagic anemia: Secondary | ICD-10-CM | POA: Diagnosis not present

## 2023-03-31 DIAGNOSIS — Z940 Kidney transplant status: Secondary | ICD-10-CM | POA: Diagnosis not present

## 2023-04-03 DIAGNOSIS — E1122 Type 2 diabetes mellitus with diabetic chronic kidney disease: Secondary | ICD-10-CM | POA: Diagnosis not present

## 2023-04-03 DIAGNOSIS — I2510 Atherosclerotic heart disease of native coronary artery without angina pectoris: Secondary | ICD-10-CM | POA: Diagnosis not present

## 2023-04-03 DIAGNOSIS — Z940 Kidney transplant status: Secondary | ICD-10-CM | POA: Diagnosis not present

## 2023-04-03 DIAGNOSIS — Z8546 Personal history of malignant neoplasm of prostate: Secondary | ICD-10-CM | POA: Diagnosis not present

## 2023-04-03 DIAGNOSIS — Z792 Long term (current) use of antibiotics: Secondary | ICD-10-CM | POA: Diagnosis not present

## 2023-04-03 DIAGNOSIS — I129 Hypertensive chronic kidney disease with stage 1 through stage 4 chronic kidney disease, or unspecified chronic kidney disease: Secondary | ICD-10-CM | POA: Diagnosis not present

## 2023-04-03 DIAGNOSIS — Z96641 Presence of right artificial hip joint: Secondary | ICD-10-CM | POA: Diagnosis not present

## 2023-04-03 DIAGNOSIS — E7800 Pure hypercholesterolemia, unspecified: Secondary | ICD-10-CM | POA: Diagnosis not present

## 2023-04-03 DIAGNOSIS — M80051D Age-related osteoporosis with current pathological fracture, right femur, subsequent encounter for fracture with routine healing: Secondary | ICD-10-CM | POA: Diagnosis not present

## 2023-04-03 DIAGNOSIS — Z794 Long term (current) use of insulin: Secondary | ICD-10-CM | POA: Diagnosis not present

## 2023-04-03 DIAGNOSIS — D631 Anemia in chronic kidney disease: Secondary | ICD-10-CM | POA: Diagnosis not present

## 2023-04-03 DIAGNOSIS — Z9181 History of falling: Secondary | ICD-10-CM | POA: Diagnosis not present

## 2023-04-03 DIAGNOSIS — I252 Old myocardial infarction: Secondary | ICD-10-CM | POA: Diagnosis not present

## 2023-04-03 DIAGNOSIS — Z7952 Long term (current) use of systemic steroids: Secondary | ICD-10-CM | POA: Diagnosis not present

## 2023-04-03 DIAGNOSIS — Z7982 Long term (current) use of aspirin: Secondary | ICD-10-CM | POA: Diagnosis not present

## 2023-04-03 DIAGNOSIS — E1165 Type 2 diabetes mellitus with hyperglycemia: Secondary | ICD-10-CM | POA: Diagnosis not present

## 2023-04-03 DIAGNOSIS — N189 Chronic kidney disease, unspecified: Secondary | ICD-10-CM | POA: Diagnosis not present

## 2023-04-03 DIAGNOSIS — K219 Gastro-esophageal reflux disease without esophagitis: Secondary | ICD-10-CM | POA: Diagnosis not present

## 2023-04-04 DIAGNOSIS — Z7982 Long term (current) use of aspirin: Secondary | ICD-10-CM | POA: Diagnosis not present

## 2023-04-04 DIAGNOSIS — Z96641 Presence of right artificial hip joint: Secondary | ICD-10-CM | POA: Diagnosis not present

## 2023-04-04 DIAGNOSIS — E1122 Type 2 diabetes mellitus with diabetic chronic kidney disease: Secondary | ICD-10-CM | POA: Diagnosis not present

## 2023-04-04 DIAGNOSIS — D84821 Immunodeficiency due to drugs: Secondary | ICD-10-CM | POA: Diagnosis not present

## 2023-04-04 DIAGNOSIS — E1165 Type 2 diabetes mellitus with hyperglycemia: Secondary | ICD-10-CM | POA: Diagnosis not present

## 2023-04-04 DIAGNOSIS — M80051D Age-related osteoporosis with current pathological fracture, right femur, subsequent encounter for fracture with routine healing: Secondary | ICD-10-CM | POA: Diagnosis not present

## 2023-04-04 DIAGNOSIS — Z8546 Personal history of malignant neoplasm of prostate: Secondary | ICD-10-CM | POA: Diagnosis not present

## 2023-04-04 DIAGNOSIS — D631 Anemia in chronic kidney disease: Secondary | ICD-10-CM | POA: Diagnosis not present

## 2023-04-04 DIAGNOSIS — N189 Chronic kidney disease, unspecified: Secondary | ICD-10-CM | POA: Diagnosis not present

## 2023-04-04 DIAGNOSIS — I129 Hypertensive chronic kidney disease with stage 1 through stage 4 chronic kidney disease, or unspecified chronic kidney disease: Secondary | ICD-10-CM | POA: Diagnosis not present

## 2023-04-04 DIAGNOSIS — Z7952 Long term (current) use of systemic steroids: Secondary | ICD-10-CM | POA: Diagnosis not present

## 2023-04-04 DIAGNOSIS — Z792 Long term (current) use of antibiotics: Secondary | ICD-10-CM | POA: Diagnosis not present

## 2023-04-04 DIAGNOSIS — I2510 Atherosclerotic heart disease of native coronary artery without angina pectoris: Secondary | ICD-10-CM | POA: Diagnosis not present

## 2023-04-04 DIAGNOSIS — I252 Old myocardial infarction: Secondary | ICD-10-CM | POA: Diagnosis not present

## 2023-04-04 DIAGNOSIS — E7800 Pure hypercholesterolemia, unspecified: Secondary | ICD-10-CM | POA: Diagnosis not present

## 2023-04-04 DIAGNOSIS — K219 Gastro-esophageal reflux disease without esophagitis: Secondary | ICD-10-CM | POA: Diagnosis not present

## 2023-04-04 DIAGNOSIS — Z794 Long term (current) use of insulin: Secondary | ICD-10-CM | POA: Diagnosis not present

## 2023-04-06 DIAGNOSIS — Z8546 Personal history of malignant neoplasm of prostate: Secondary | ICD-10-CM | POA: Diagnosis not present

## 2023-04-06 DIAGNOSIS — Z7982 Long term (current) use of aspirin: Secondary | ICD-10-CM | POA: Diagnosis not present

## 2023-04-06 DIAGNOSIS — I2510 Atherosclerotic heart disease of native coronary artery without angina pectoris: Secondary | ICD-10-CM | POA: Diagnosis not present

## 2023-04-06 DIAGNOSIS — E7800 Pure hypercholesterolemia, unspecified: Secondary | ICD-10-CM | POA: Diagnosis not present

## 2023-04-06 DIAGNOSIS — I129 Hypertensive chronic kidney disease with stage 1 through stage 4 chronic kidney disease, or unspecified chronic kidney disease: Secondary | ICD-10-CM | POA: Diagnosis not present

## 2023-04-06 DIAGNOSIS — M80051D Age-related osteoporosis with current pathological fracture, right femur, subsequent encounter for fracture with routine healing: Secondary | ICD-10-CM | POA: Diagnosis not present

## 2023-04-06 DIAGNOSIS — E1122 Type 2 diabetes mellitus with diabetic chronic kidney disease: Secondary | ICD-10-CM | POA: Diagnosis not present

## 2023-04-06 DIAGNOSIS — Z794 Long term (current) use of insulin: Secondary | ICD-10-CM | POA: Diagnosis not present

## 2023-04-06 DIAGNOSIS — K219 Gastro-esophageal reflux disease without esophagitis: Secondary | ICD-10-CM | POA: Diagnosis not present

## 2023-04-06 DIAGNOSIS — Z792 Long term (current) use of antibiotics: Secondary | ICD-10-CM | POA: Diagnosis not present

## 2023-04-06 DIAGNOSIS — I252 Old myocardial infarction: Secondary | ICD-10-CM | POA: Diagnosis not present

## 2023-04-06 DIAGNOSIS — E1165 Type 2 diabetes mellitus with hyperglycemia: Secondary | ICD-10-CM | POA: Diagnosis not present

## 2023-04-06 DIAGNOSIS — Z7952 Long term (current) use of systemic steroids: Secondary | ICD-10-CM | POA: Diagnosis not present

## 2023-04-06 DIAGNOSIS — D631 Anemia in chronic kidney disease: Secondary | ICD-10-CM | POA: Diagnosis not present

## 2023-04-06 DIAGNOSIS — Z96641 Presence of right artificial hip joint: Secondary | ICD-10-CM | POA: Diagnosis not present

## 2023-04-06 DIAGNOSIS — N189 Chronic kidney disease, unspecified: Secondary | ICD-10-CM | POA: Diagnosis not present

## 2023-04-07 DIAGNOSIS — I129 Hypertensive chronic kidney disease with stage 1 through stage 4 chronic kidney disease, or unspecified chronic kidney disease: Secondary | ICD-10-CM | POA: Diagnosis not present

## 2023-04-07 DIAGNOSIS — I252 Old myocardial infarction: Secondary | ICD-10-CM | POA: Diagnosis not present

## 2023-04-07 DIAGNOSIS — D631 Anemia in chronic kidney disease: Secondary | ICD-10-CM | POA: Diagnosis not present

## 2023-04-07 DIAGNOSIS — Z8546 Personal history of malignant neoplasm of prostate: Secondary | ICD-10-CM | POA: Diagnosis not present

## 2023-04-07 DIAGNOSIS — I2510 Atherosclerotic heart disease of native coronary artery without angina pectoris: Secondary | ICD-10-CM | POA: Diagnosis not present

## 2023-04-07 DIAGNOSIS — Z794 Long term (current) use of insulin: Secondary | ICD-10-CM | POA: Diagnosis not present

## 2023-04-07 DIAGNOSIS — E1165 Type 2 diabetes mellitus with hyperglycemia: Secondary | ICD-10-CM | POA: Diagnosis not present

## 2023-04-07 DIAGNOSIS — Z7952 Long term (current) use of systemic steroids: Secondary | ICD-10-CM | POA: Diagnosis not present

## 2023-04-07 DIAGNOSIS — Z792 Long term (current) use of antibiotics: Secondary | ICD-10-CM | POA: Diagnosis not present

## 2023-04-07 DIAGNOSIS — Z96641 Presence of right artificial hip joint: Secondary | ICD-10-CM | POA: Diagnosis not present

## 2023-04-07 DIAGNOSIS — N189 Chronic kidney disease, unspecified: Secondary | ICD-10-CM | POA: Diagnosis not present

## 2023-04-07 DIAGNOSIS — Z7982 Long term (current) use of aspirin: Secondary | ICD-10-CM | POA: Diagnosis not present

## 2023-04-07 DIAGNOSIS — E7800 Pure hypercholesterolemia, unspecified: Secondary | ICD-10-CM | POA: Diagnosis not present

## 2023-04-07 DIAGNOSIS — E1122 Type 2 diabetes mellitus with diabetic chronic kidney disease: Secondary | ICD-10-CM | POA: Diagnosis not present

## 2023-04-07 DIAGNOSIS — M80051D Age-related osteoporosis with current pathological fracture, right femur, subsequent encounter for fracture with routine healing: Secondary | ICD-10-CM | POA: Diagnosis not present

## 2023-04-07 DIAGNOSIS — K219 Gastro-esophageal reflux disease without esophagitis: Secondary | ICD-10-CM | POA: Diagnosis not present

## 2023-04-10 ENCOUNTER — Inpatient Hospital Stay: Payer: Medicare Other | Admitting: Internal Medicine

## 2023-04-10 DIAGNOSIS — I129 Hypertensive chronic kidney disease with stage 1 through stage 4 chronic kidney disease, or unspecified chronic kidney disease: Secondary | ICD-10-CM | POA: Diagnosis not present

## 2023-04-10 DIAGNOSIS — M80051D Age-related osteoporosis with current pathological fracture, right femur, subsequent encounter for fracture with routine healing: Secondary | ICD-10-CM | POA: Diagnosis not present

## 2023-04-10 DIAGNOSIS — I2510 Atherosclerotic heart disease of native coronary artery without angina pectoris: Secondary | ICD-10-CM | POA: Diagnosis not present

## 2023-04-10 DIAGNOSIS — I252 Old myocardial infarction: Secondary | ICD-10-CM | POA: Diagnosis not present

## 2023-04-10 DIAGNOSIS — E1165 Type 2 diabetes mellitus with hyperglycemia: Secondary | ICD-10-CM | POA: Diagnosis not present

## 2023-04-10 DIAGNOSIS — Z96641 Presence of right artificial hip joint: Secondary | ICD-10-CM | POA: Diagnosis not present

## 2023-04-10 DIAGNOSIS — Z794 Long term (current) use of insulin: Secondary | ICD-10-CM | POA: Diagnosis not present

## 2023-04-10 DIAGNOSIS — N189 Chronic kidney disease, unspecified: Secondary | ICD-10-CM | POA: Diagnosis not present

## 2023-04-10 DIAGNOSIS — E1122 Type 2 diabetes mellitus with diabetic chronic kidney disease: Secondary | ICD-10-CM | POA: Diagnosis not present

## 2023-04-10 DIAGNOSIS — Z7952 Long term (current) use of systemic steroids: Secondary | ICD-10-CM | POA: Diagnosis not present

## 2023-04-10 DIAGNOSIS — D631 Anemia in chronic kidney disease: Secondary | ICD-10-CM | POA: Diagnosis not present

## 2023-04-10 DIAGNOSIS — Z7982 Long term (current) use of aspirin: Secondary | ICD-10-CM | POA: Diagnosis not present

## 2023-04-10 DIAGNOSIS — E7800 Pure hypercholesterolemia, unspecified: Secondary | ICD-10-CM | POA: Diagnosis not present

## 2023-04-10 DIAGNOSIS — K219 Gastro-esophageal reflux disease without esophagitis: Secondary | ICD-10-CM | POA: Diagnosis not present

## 2023-04-10 DIAGNOSIS — Z792 Long term (current) use of antibiotics: Secondary | ICD-10-CM | POA: Diagnosis not present

## 2023-04-10 DIAGNOSIS — Z8546 Personal history of malignant neoplasm of prostate: Secondary | ICD-10-CM | POA: Diagnosis not present

## 2023-04-12 DIAGNOSIS — M80051D Age-related osteoporosis with current pathological fracture, right femur, subsequent encounter for fracture with routine healing: Secondary | ICD-10-CM | POA: Diagnosis not present

## 2023-04-12 DIAGNOSIS — N189 Chronic kidney disease, unspecified: Secondary | ICD-10-CM | POA: Diagnosis not present

## 2023-04-12 DIAGNOSIS — Z794 Long term (current) use of insulin: Secondary | ICD-10-CM | POA: Diagnosis not present

## 2023-04-12 DIAGNOSIS — I2510 Atherosclerotic heart disease of native coronary artery without angina pectoris: Secondary | ICD-10-CM | POA: Diagnosis not present

## 2023-04-12 DIAGNOSIS — Z7982 Long term (current) use of aspirin: Secondary | ICD-10-CM | POA: Diagnosis not present

## 2023-04-12 DIAGNOSIS — E7800 Pure hypercholesterolemia, unspecified: Secondary | ICD-10-CM | POA: Diagnosis not present

## 2023-04-12 DIAGNOSIS — K219 Gastro-esophageal reflux disease without esophagitis: Secondary | ICD-10-CM | POA: Diagnosis not present

## 2023-04-12 DIAGNOSIS — Z96641 Presence of right artificial hip joint: Secondary | ICD-10-CM | POA: Diagnosis not present

## 2023-04-12 DIAGNOSIS — I252 Old myocardial infarction: Secondary | ICD-10-CM | POA: Diagnosis not present

## 2023-04-12 DIAGNOSIS — E1122 Type 2 diabetes mellitus with diabetic chronic kidney disease: Secondary | ICD-10-CM | POA: Diagnosis not present

## 2023-04-12 DIAGNOSIS — Z7952 Long term (current) use of systemic steroids: Secondary | ICD-10-CM | POA: Diagnosis not present

## 2023-04-12 DIAGNOSIS — Z8546 Personal history of malignant neoplasm of prostate: Secondary | ICD-10-CM | POA: Diagnosis not present

## 2023-04-12 DIAGNOSIS — Z792 Long term (current) use of antibiotics: Secondary | ICD-10-CM | POA: Diagnosis not present

## 2023-04-12 DIAGNOSIS — D631 Anemia in chronic kidney disease: Secondary | ICD-10-CM | POA: Diagnosis not present

## 2023-04-12 DIAGNOSIS — I129 Hypertensive chronic kidney disease with stage 1 through stage 4 chronic kidney disease, or unspecified chronic kidney disease: Secondary | ICD-10-CM | POA: Diagnosis not present

## 2023-04-12 DIAGNOSIS — E1165 Type 2 diabetes mellitus with hyperglycemia: Secondary | ICD-10-CM | POA: Diagnosis not present

## 2023-04-13 DIAGNOSIS — Z96641 Presence of right artificial hip joint: Secondary | ICD-10-CM | POA: Diagnosis not present

## 2023-04-13 DIAGNOSIS — I252 Old myocardial infarction: Secondary | ICD-10-CM | POA: Diagnosis not present

## 2023-04-13 DIAGNOSIS — K219 Gastro-esophageal reflux disease without esophagitis: Secondary | ICD-10-CM | POA: Diagnosis not present

## 2023-04-13 DIAGNOSIS — E7800 Pure hypercholesterolemia, unspecified: Secondary | ICD-10-CM | POA: Diagnosis not present

## 2023-04-13 DIAGNOSIS — I129 Hypertensive chronic kidney disease with stage 1 through stage 4 chronic kidney disease, or unspecified chronic kidney disease: Secondary | ICD-10-CM | POA: Diagnosis not present

## 2023-04-13 DIAGNOSIS — E1165 Type 2 diabetes mellitus with hyperglycemia: Secondary | ICD-10-CM | POA: Diagnosis not present

## 2023-04-13 DIAGNOSIS — Z794 Long term (current) use of insulin: Secondary | ICD-10-CM | POA: Diagnosis not present

## 2023-04-13 DIAGNOSIS — Z7952 Long term (current) use of systemic steroids: Secondary | ICD-10-CM | POA: Diagnosis not present

## 2023-04-13 DIAGNOSIS — D631 Anemia in chronic kidney disease: Secondary | ICD-10-CM | POA: Diagnosis not present

## 2023-04-13 DIAGNOSIS — Z8546 Personal history of malignant neoplasm of prostate: Secondary | ICD-10-CM | POA: Diagnosis not present

## 2023-04-13 DIAGNOSIS — E1122 Type 2 diabetes mellitus with diabetic chronic kidney disease: Secondary | ICD-10-CM | POA: Diagnosis not present

## 2023-04-13 DIAGNOSIS — I2510 Atherosclerotic heart disease of native coronary artery without angina pectoris: Secondary | ICD-10-CM | POA: Diagnosis not present

## 2023-04-13 DIAGNOSIS — N189 Chronic kidney disease, unspecified: Secondary | ICD-10-CM | POA: Diagnosis not present

## 2023-04-13 DIAGNOSIS — Z7982 Long term (current) use of aspirin: Secondary | ICD-10-CM | POA: Diagnosis not present

## 2023-04-13 DIAGNOSIS — M80051D Age-related osteoporosis with current pathological fracture, right femur, subsequent encounter for fracture with routine healing: Secondary | ICD-10-CM | POA: Diagnosis not present

## 2023-04-13 DIAGNOSIS — Z792 Long term (current) use of antibiotics: Secondary | ICD-10-CM | POA: Diagnosis not present

## 2023-04-14 ENCOUNTER — Encounter: Payer: Self-pay | Admitting: Internal Medicine

## 2023-04-14 ENCOUNTER — Ambulatory Visit: Payer: Medicare Other | Admitting: Internal Medicine

## 2023-04-14 VITALS — BP 128/66 | HR 66 | Temp 98.6°F | Resp 18 | Ht 70.0 in | Wt 167.4 lb

## 2023-04-14 DIAGNOSIS — S72009D Fracture of unspecified part of neck of unspecified femur, subsequent encounter for closed fracture with routine healing: Secondary | ICD-10-CM | POA: Diagnosis not present

## 2023-04-14 NOTE — Progress Notes (Signed)
Office Visit  Subjective   Patient ID: Jason Zimmerman   DOB: [birth year]   Age: 67 y.o.   MRN: [id number]   Chief Complaint Chief Complaint  Patient presents with   Follow-up    Hospital F/u     History of Present Illness Jason Zimmerman is a 67 yo male who comes in today for a hospital followup where he was admitted to [facility] from 03/27/2023 until 03/31/2023 after he had a fall and a resultant right femoral neck fracture.  Ortho was consulted and the patient underwent left hip hemiarthoplasty on 03/28/2023.  He had some post op anemia where his HgB dropped from 12.1 to 8.7.  He was asymptomatic and he did not need transfusion and sent him home on iron.  He is on oxycodone 5mg  po q 6 hr prn and he takes 1/2 tab at night.   He currently has homehealth physical therapy at home.  Today, he has followed up with ortho and he is doing well and they are going to see him back in 4 weeks.  His pain is tolerable and he is otherwise having no other problems.       Past Medical History Past Medical History:  Diagnosis Date   Anemia associated with chronic renal failure    CKD (chronic kidney disease), stage V (HCC)    Coronary disease    Diabetic nephropathy associated with type 2 diabetes mellitus (HCC)    Diabetic retinopathy (HCC)    Fatty liver    per patient's wife   GERD (gastroesophageal reflux disease) 03/14/2022   no meds   Heart attack (HCC) 10/19/2010   History of prostate cancer    Hyperlipidemia    Hypertension, renal    Ocular hypertension    Prostate cancer (HCC) 2011   RTA (renal tubular acidosis)    Secondary hyperparathyroidism, renal (HCC)    Vitamin D deficiency      Allergies Allergies  Allergen Reactions   Cefazolin Anaphylaxis    Administered for surgical prophylaxis along with platelets and pt subsequently had presumed allergic reaction with hypotension, tachycardia elevate peak pressures     Medications  Current Outpatient Medications:    acetaminophen  (TYLENOL) 500 MG tablet, Take 1,000 mg by mouth every 6 (six) hours as needed (pain.)., Disp: , Rfl:    aspirin EC 81 MG tablet, Take 81 mg by mouth at bedtime., Disp: , Rfl:    atorvastatin (LIPITOR) 10 MG tablet, Take 1 tablet by mouth daily., Disp: , Rfl:    carvedilol (COREG) 3.125 MG tablet, Take 3.125 mg by mouth 2 (two) times daily with a meal., Disp: , Rfl:    ezetimibe (ZETIA) 10 MG tablet, Take 10 mg by mouth in the morning., Disp: , Rfl:    glucose blood (FREESTYLE TEST STRIPS) test strip, 1 each by Other route 2 (two) times daily as needed for other. PRN, Disp: 102 each, Rfl: 5   ibuprofen (ADVIL) 600 MG tablet, Take 1 tablet (600 mg total) by mouth every 6 (six) hours as needed for mild pain., Disp: 30 tablet, Rfl: 0   insulin glargine (LANTUS) 100 UNIT/ML Solostar Pen, Inject 48 Units into the skin at bedtime., Disp: , Rfl:    LYUMJEV KWIKPEN 100 UNIT/ML KwikPen, Inject into the skin., Disp: , Rfl:    mycophenolate (MYFORTIC) 180 MG EC tablet, Take 180 mg by mouth 2 (two) times daily., Disp: , Rfl:    nitroGLYCERIN (NITROSTAT) 0.4 MG SL tablet, Place 0.4 mg under  the tongue every 5 (five) minutes x 3 doses as needed for chest pain., Disp: , Rfl:    omeprazole (PRILOSEC) 20 MG capsule, Take 20 mg by mouth daily., Disp: , Rfl:    OZEMPIC, 0.25 OR 0.5 MG/DOSE, 2 MG/3ML SOPN, Inject 0.25 mg into the skin once a week., Disp: , Rfl:    predniSONE (DELTASONE) 5 MG tablet, Take 5 mg by mouth daily with breakfast., Disp: , Rfl:    predniSONE (DELTASONE) 5 MG tablet, Take 1 tablet by mouth daily., Disp: , Rfl:    sulfamethoxazole-trimethoprim (BACTRIM) 400-80 MG tablet, Take 1 tablet by mouth 3 (three) times a week., Disp: , Rfl:    tacrolimus (PROGRAF) 0.5 MG capsule, Take 0.5 mg by mouth daily. Total a day is 1.5mg  (takes 1mg  BID and 0.5mg  once a day), Disp: , Rfl:    tacrolimus (PROGRAF) 1 MG capsule, Take 1.5 mg by mouth 2 (two) times daily., Disp: , Rfl:    timolol (TIMOPTIC) 0.5 %  ophthalmic solution, Place 1 drop into the left eye 2 (two) times daily., Disp: 10 mL, Rfl: 2   Review of Systems Review of Systems  Constitutional:  Negative for chills and fever.  Respiratory:  Negative for cough and shortness of breath.   Cardiovascular:  Negative for chest pain, palpitations and leg swelling.  Gastrointestinal:  Negative for abdominal pain, constipation, diarrhea, nausea and vomiting.  Musculoskeletal:  Positive for back pain and joint pain. Negative for myalgias.  Neurological:  Negative for dizziness, weakness and headaches.       Objective:    Vitals BP 128/66   Pulse 66   Temp 98.6 F (37 C)   Resp 18   Ht 5\' 10"  (1.778 m)   Wt 167 lb 6.4 oz (75.9 kg)   SpO2 99%   BMI 24.02 kg/m    Physical Examination Physical Exam Constitutional:      Appearance: Normal appearance. He is not ill-appearing.  Cardiovascular:     Rate and Rhythm: Normal rate and regular rhythm.     Pulses: Normal pulses.     Heart sounds: No murmur heard.    No friction rub. No gallop.  Pulmonary:     Effort: Pulmonary effort is normal. No respiratory distress.     Breath sounds: No wheezing, rhonchi or rales.  Abdominal:     General: Abdomen is flat. Bowel sounds are normal. There is no distension.     Palpations: Abdomen is soft.     Tenderness: There is no abdominal tenderness.  Musculoskeletal:     Right lower leg: No edema.     Left lower leg: No edema.  Skin:    General: Skin is warm and dry.     Findings: No rash.  Neurological:     Mental Status: He is alert.        Assessment & Plan:   Closed fracture of hip with routine healing He can continue on his oxycodone as needed.  He is to continue on therapies.  I am not going to check his CBC at this visit.  They will probably check it next week with GI.  I will see him back in 1 month.    No follow-ups on file.   Crist Fat, MD

## 2023-04-14 NOTE — Assessment & Plan Note (Signed)
He can continue on his oxycodone as needed.  He is to continue on therapies.  I am not going to check his CBC at this visit.  They will probably check it next week with GI.  I will see him back in 1 month.

## 2023-04-17 DIAGNOSIS — M80051D Age-related osteoporosis with current pathological fracture, right femur, subsequent encounter for fracture with routine healing: Secondary | ICD-10-CM | POA: Diagnosis not present

## 2023-04-17 DIAGNOSIS — I129 Hypertensive chronic kidney disease with stage 1 through stage 4 chronic kidney disease, or unspecified chronic kidney disease: Secondary | ICD-10-CM | POA: Diagnosis not present

## 2023-04-17 DIAGNOSIS — K219 Gastro-esophageal reflux disease without esophagitis: Secondary | ICD-10-CM | POA: Diagnosis not present

## 2023-04-17 DIAGNOSIS — E1165 Type 2 diabetes mellitus with hyperglycemia: Secondary | ICD-10-CM | POA: Diagnosis not present

## 2023-04-17 DIAGNOSIS — Z794 Long term (current) use of insulin: Secondary | ICD-10-CM | POA: Diagnosis not present

## 2023-04-17 DIAGNOSIS — Z792 Long term (current) use of antibiotics: Secondary | ICD-10-CM | POA: Diagnosis not present

## 2023-04-17 DIAGNOSIS — D631 Anemia in chronic kidney disease: Secondary | ICD-10-CM | POA: Diagnosis not present

## 2023-04-17 DIAGNOSIS — Z8546 Personal history of malignant neoplasm of prostate: Secondary | ICD-10-CM | POA: Diagnosis not present

## 2023-04-17 DIAGNOSIS — I2510 Atherosclerotic heart disease of native coronary artery without angina pectoris: Secondary | ICD-10-CM | POA: Diagnosis not present

## 2023-04-17 DIAGNOSIS — E7800 Pure hypercholesterolemia, unspecified: Secondary | ICD-10-CM | POA: Diagnosis not present

## 2023-04-17 DIAGNOSIS — Z7952 Long term (current) use of systemic steroids: Secondary | ICD-10-CM | POA: Diagnosis not present

## 2023-04-17 DIAGNOSIS — Z7982 Long term (current) use of aspirin: Secondary | ICD-10-CM | POA: Diagnosis not present

## 2023-04-17 DIAGNOSIS — Z96641 Presence of right artificial hip joint: Secondary | ICD-10-CM | POA: Diagnosis not present

## 2023-04-17 DIAGNOSIS — E1122 Type 2 diabetes mellitus with diabetic chronic kidney disease: Secondary | ICD-10-CM | POA: Diagnosis not present

## 2023-04-17 DIAGNOSIS — I252 Old myocardial infarction: Secondary | ICD-10-CM | POA: Diagnosis not present

## 2023-04-17 DIAGNOSIS — N189 Chronic kidney disease, unspecified: Secondary | ICD-10-CM | POA: Diagnosis not present

## 2023-04-19 ENCOUNTER — Ambulatory Visit: Payer: Medicare Other | Admitting: Gastroenterology

## 2023-04-19 ENCOUNTER — Other Ambulatory Visit (INDEPENDENT_AMBULATORY_CARE_PROVIDER_SITE_OTHER): Payer: Medicare Other

## 2023-04-19 ENCOUNTER — Encounter: Payer: Self-pay | Admitting: Gastroenterology

## 2023-04-19 VITALS — BP 102/58 | HR 80 | Ht 70.0 in | Wt 167.5 lb

## 2023-04-19 DIAGNOSIS — Z940 Kidney transplant status: Secondary | ICD-10-CM

## 2023-04-19 DIAGNOSIS — K5903 Drug induced constipation: Secondary | ICD-10-CM | POA: Diagnosis not present

## 2023-04-19 DIAGNOSIS — R7989 Other specified abnormal findings of blood chemistry: Secondary | ICD-10-CM | POA: Diagnosis not present

## 2023-04-19 DIAGNOSIS — T50905A Adverse effect of unspecified drugs, medicaments and biological substances, initial encounter: Secondary | ICD-10-CM

## 2023-04-19 DIAGNOSIS — R7401 Elevation of levels of liver transaminase levels: Secondary | ICD-10-CM | POA: Diagnosis not present

## 2023-04-19 LAB — HEPATIC FUNCTION PANEL
ALT: 45 U/L (ref 0–53)
AST: 44 U/L — ABNORMAL HIGH (ref 0–37)
Albumin: 3.9 g/dL (ref 3.5–5.2)
Alkaline Phosphatase: 239 U/L — ABNORMAL HIGH (ref 39–117)
Bilirubin, Direct: 0.3 mg/dL (ref 0.0–0.3)
Total Bilirubin: 1.2 mg/dL (ref 0.2–1.2)
Total Protein: 6.9 g/dL (ref 6.0–8.3)

## 2023-04-19 NOTE — Patient Instructions (Signed)
_______________________________________________________  If your blood pressure at your visit was 140/90 or greater, please contact your primary care physician to follow up on this. _______________________________________________________  If you are age 70 or older, your body mass index should be between 23-30. Your Body mass index is 24.03 kg/m. If this is out of the aforementioned range listed, please consider follow up with your Primary Care Provider. ______________________________________________________  The [facility] providers would like to encourage you to use [facility] to communicate with providers for non-urgent requests or questions.  Due to long hold times on the telephone, sending your provider a message by [facility] may be a faster and more efficient way to get a response.  Please allow 48 business hours for a response.  Please remember that this is for non-urgent requests.  _______________________________________________________  Your provider has requested that you go to the basement level for lab work before leaving today. Press "B" on the elevator. The lab is located at the first door on the left as you exit the elevator.  Due to recent changes in healthcare laws, you may see the results of your imaging and laboratory studies on MyChart before your provider has had a chance to review them.  We understand that in some cases there may be results that are confusing or concerning to you. Not all laboratory results come back in the same time frame and the provider may be waiting for multiple results in order to interpret others.  Please give [place] 48 hours in order for your provider to thoroughly review all the results before contacting the office for clarification of your results.   It was a pleasure to see you today!  Vito Cirigliano, D.O.

## 2023-04-19 NOTE — Progress Notes (Unsigned)
Chief Complaint:    Elevated liver enzymes, change in bowel habits, constipation  GI History:  67 year old male with a history of CKD 5 now s/p kidney transplant 04/2022, anemia of chronic disease on ESA therapy, prostate CA, CAD w/ hx of STEMI with cardiac arrest 2012 requiring DES, diabetes (A1c 9.8%), HLD, secondary hyperparathyroidism   -01/02/2018: Initial appointment in GI clinic for evaluation of dysphagia, GERD, and CRC screening.  Reports longstanding history of GERD, previously treated with PPI but that was stopped this CKD with subsequent worsening reflux symptoms.  Index symptoms of heartburn, regurgitation, nocturnal reflux. - 02/13/2018: EGD: Moderate stenosis at 39 cm dilated with 15 mm TTS balloon, LA Grade B esophagitis, Hill grade 3 valve, mild gastritis, duodenitis.  Recommended restarting PPI, start sucralfate - 02/13/2018: Colonoscopy: 2 mm rectal hyperplastic polyp, otherwise normal.  Normal TI.  Repeat 10 years -03/14/2022: EGD: Grade 1 esophageal varices, benign stricture in lower esophagus which was easily traversed.  Moderate gastritis and peptic duodenitis.  Benign esophageal nonobstructing stricture was not dilated due to the concomitant presence of esophageal varices.  - 03/31/2022: VCE: Complete study, poor prep.  Gastritis, copious amounts of dark fluid/material in stomach and throughout small bowel with some obscuring visualization.  No fresh bleeding or clear stigmata of bleeding.  Highest suspicion for intermittent oozing from gastritis and recommend surveillance CBCs and intermittent iron panels with IV iron as needed - 10/05/2022: Esophageal manometry: No significant esophageal peristaltic abnormality.  Complete bolus clearance on 6/10 swallows, and incomplete clearance on 4/10 swallows. - Interestingly, dysphagia stopped after renal transplant in 04/2022.  HPI:     Patient is a 67 y.o. male presenting to the Gastroenterology Clinic for follow-up.  Was last seen by  me on 05/13/2022.  Had elevated liver enzymes at that time, but these were downtrending.  Had been holding his postoperative fluconazole and Bactrim prophylaxis due to the elevated enzymes.  He was seen in follow-up in the Transplant Clinic on 02/28/2023.  Has since restarted Bactrim earlier this year after liver enzymes downtrended. Still waking MWF.  However, liver enzymes again uptrending on 10/29 as below, and Lipitor was stopped.  He was then admitted to [facility] for hip fracture on 11/25 as below, and Lipitor was restarted at that time.  His PCM then stopped this again last week and currently not taking; still on Zetia.   - 01/24/2023: AST/ALT 52/47, ALP 247, T. bili 1.3 - 02/28/2023: AST/ALT 131/147, ALP 199, T. bili 1.0.  BK virus detected.  PLT 79  Was admitted to [facility] 11/25-29 after a fall and right femoral neck fracture and underwent left hip hemiarthroplasty on 11/26.  Postop course complicated by anemia with hemoglobin 8.7, sent home on IV iron without need for blood transfusion.  No labs since then available for review.  He has been having constipation for the last couple of weeks.  Started taking MiraLAX daily and Senokot prn, and now regular soft stools. Only started after prescribed oxycodone after hip fracture/surgery.   Has f/u Jan 18 at [facility].   Review of systems:     No chest pain, no SOB, no fevers, no urinary sx   Past Medical History:  Diagnosis Date   Anemia associated with chronic renal failure    CKD (chronic kidney disease), stage V (HCC)    Coronary disease    Diabetic nephropathy associated with type 2 diabetes mellitus (HCC)    Diabetic retinopathy (HCC)    Fatty liver  per patient's wife   GERD (gastroesophageal reflux disease) 03/14/2022   no meds   Heart attack (HCC) 10/19/2010   History of prostate cancer    Hyperlipidemia    Hypertension, renal    Ocular hypertension    Prostate cancer (HCC) 2011   RTA (renal tubular  acidosis)    Secondary hyperparathyroidism, renal (HCC)    Vitamin D deficiency     Patient's surgical history, family medical history, social history, medications and allergies were all reviewed in Epic    Current Outpatient Medications  Medication Sig Dispense Refill   acetaminophen (TYLENOL) 500 MG tablet Take 1,000 mg by mouth every 6 (six) hours as needed (pain.).     aspirin EC 81 MG tablet Take 81 mg by mouth at bedtime.     carvedilol (COREG) 3.125 MG tablet Take 3.125 mg by mouth 2 (two) times daily with a meal.     ezetimibe (ZETIA) 10 MG tablet Take 10 mg by mouth in the morning.     glucose blood (FREESTYLE TEST STRIPS) test strip 1 each by Other route 2 (two) times daily as needed for other. PRN 102 each 5   insulin glargine (LANTUS) 100 UNIT/ML Solostar Pen Inject 48 Units into the skin at bedtime.     iron polysaccharides (NIFEREX) 150 MG capsule Take 150 mg by mouth daily.     LYUMJEV KWIKPEN 100 UNIT/ML KwikPen Inject into the skin.     mycophenolate (MYFORTIC) 180 MG EC tablet Take 180 mg by mouth 2 (two) times daily.     nitroGLYCERIN (NITROSTAT) 0.4 MG SL tablet Place 0.4 mg under the tongue every 5 (five) minutes x 3 doses as needed for chest pain.     omeprazole (PRILOSEC) 20 MG capsule Take 20 mg by mouth daily.     oxyCODONE (OXY IR/ROXICODONE) 5 MG immediate release tablet Take 5 mg by mouth every 6 (six) hours as needed.     predniSONE (DELTASONE) 5 MG tablet Take 5 mg by mouth daily with breakfast.     predniSONE (DELTASONE) 5 MG tablet Take 1 tablet by mouth daily.     SENNA S 8.6-50 MG tablet Take 2 tablets by mouth as needed.     sulfamethoxazole-trimethoprim (BACTRIM) 400-80 MG tablet Take 1 tablet by mouth 3 (three) times a week.     tacrolimus (PROGRAF) 0.5 MG capsule Take 0.5 mg by mouth daily. Total a day is 1.5mg  (takes 1mg  BID and 0.5mg  once a day)     tacrolimus (PROGRAF) 1 MG capsule Take 1.5 mg by mouth 2 (two) times daily.     timolol (TIMOPTIC)  0.5 % ophthalmic solution Place 1 drop into the left eye 2 (two) times daily. 10 mL 2   No current facility-administered medications for this visit.    Physical Exam:     BP (!) 102/58   Pulse 80   Ht 5\' 10"  (1.778 m)   Wt 167 lb 8 oz (76 kg)   BMI 24.03 kg/m   GENERAL:  Pleasant male in NAD, walking with walker after recent hip surgery PSYCH: : Cooperative, normal affect NEURO: Alert and oriented x 3   IMPRESSION and PLAN:    1) Elevated liver enzymes Increase suspicion for DILI based on temp relationship of liver enzyme elevation and medications.  Had then stopped Bactrim and enzymes down trended this year.  Bactrim was restarted and enzymes were uptrending.  Lipitor was stopped, restarted, then most recently stopped again last week, so will be  difficult to interpret how this medication is affecting liver enzymes until further washout.  Discussed additional etiologies for elevated liver enzymes, particularly in the posttransplantation with plan for the following:  - Repeat liver enzymes today - If enzymes uptrending, will send to [facility] for evaluation for DILI and message Renal Transplant MD about holding bactrim again.  - Continue holding Lipitor for the time being  2) Constipation Medication induced constipation.  Improving with MiraLAX and senna - Continue adequate hydration - Continue MiraLAX and senna -If needed, can trial course of methylnaltrexone  3) History of kidney transplant - Follow-up appoint with [facility] next month as scheduled   CC: Crist Fat, MD Latanya Maudlin, MD          Shellia Cleverly ,DO, [facility] 04/19/2023, 10:24 AM

## 2023-04-20 ENCOUNTER — Other Ambulatory Visit: Payer: Self-pay

## 2023-04-20 DIAGNOSIS — R7401 Elevation of levels of liver transaminase levels: Secondary | ICD-10-CM

## 2023-04-21 DIAGNOSIS — Z792 Long term (current) use of antibiotics: Secondary | ICD-10-CM | POA: Diagnosis not present

## 2023-04-21 DIAGNOSIS — E1122 Type 2 diabetes mellitus with diabetic chronic kidney disease: Secondary | ICD-10-CM | POA: Diagnosis not present

## 2023-04-21 DIAGNOSIS — D631 Anemia in chronic kidney disease: Secondary | ICD-10-CM | POA: Diagnosis not present

## 2023-04-21 DIAGNOSIS — Z96641 Presence of right artificial hip joint: Secondary | ICD-10-CM | POA: Diagnosis not present

## 2023-04-21 DIAGNOSIS — K219 Gastro-esophageal reflux disease without esophagitis: Secondary | ICD-10-CM | POA: Diagnosis not present

## 2023-04-21 DIAGNOSIS — Z8546 Personal history of malignant neoplasm of prostate: Secondary | ICD-10-CM | POA: Diagnosis not present

## 2023-04-21 DIAGNOSIS — Z7982 Long term (current) use of aspirin: Secondary | ICD-10-CM | POA: Diagnosis not present

## 2023-04-21 DIAGNOSIS — I252 Old myocardial infarction: Secondary | ICD-10-CM | POA: Diagnosis not present

## 2023-04-21 DIAGNOSIS — Z794 Long term (current) use of insulin: Secondary | ICD-10-CM | POA: Diagnosis not present

## 2023-04-21 DIAGNOSIS — E1165 Type 2 diabetes mellitus with hyperglycemia: Secondary | ICD-10-CM | POA: Diagnosis not present

## 2023-04-21 DIAGNOSIS — M80051D Age-related osteoporosis with current pathological fracture, right femur, subsequent encounter for fracture with routine healing: Secondary | ICD-10-CM | POA: Diagnosis not present

## 2023-04-21 DIAGNOSIS — I129 Hypertensive chronic kidney disease with stage 1 through stage 4 chronic kidney disease, or unspecified chronic kidney disease: Secondary | ICD-10-CM | POA: Diagnosis not present

## 2023-04-21 DIAGNOSIS — I2510 Atherosclerotic heart disease of native coronary artery without angina pectoris: Secondary | ICD-10-CM | POA: Diagnosis not present

## 2023-04-21 DIAGNOSIS — Z7952 Long term (current) use of systemic steroids: Secondary | ICD-10-CM | POA: Diagnosis not present

## 2023-04-21 DIAGNOSIS — E7800 Pure hypercholesterolemia, unspecified: Secondary | ICD-10-CM | POA: Diagnosis not present

## 2023-04-21 DIAGNOSIS — N189 Chronic kidney disease, unspecified: Secondary | ICD-10-CM | POA: Diagnosis not present

## 2023-04-24 DIAGNOSIS — I129 Hypertensive chronic kidney disease with stage 1 through stage 4 chronic kidney disease, or unspecified chronic kidney disease: Secondary | ICD-10-CM | POA: Diagnosis not present

## 2023-04-24 DIAGNOSIS — K219 Gastro-esophageal reflux disease without esophagitis: Secondary | ICD-10-CM | POA: Diagnosis not present

## 2023-04-24 DIAGNOSIS — I2510 Atherosclerotic heart disease of native coronary artery without angina pectoris: Secondary | ICD-10-CM | POA: Diagnosis not present

## 2023-04-24 DIAGNOSIS — Z7982 Long term (current) use of aspirin: Secondary | ICD-10-CM | POA: Diagnosis not present

## 2023-04-24 DIAGNOSIS — Z794 Long term (current) use of insulin: Secondary | ICD-10-CM | POA: Diagnosis not present

## 2023-04-24 DIAGNOSIS — E1165 Type 2 diabetes mellitus with hyperglycemia: Secondary | ICD-10-CM | POA: Diagnosis not present

## 2023-04-24 DIAGNOSIS — E1122 Type 2 diabetes mellitus with diabetic chronic kidney disease: Secondary | ICD-10-CM | POA: Diagnosis not present

## 2023-04-24 DIAGNOSIS — E7800 Pure hypercholesterolemia, unspecified: Secondary | ICD-10-CM | POA: Diagnosis not present

## 2023-04-24 DIAGNOSIS — Z7952 Long term (current) use of systemic steroids: Secondary | ICD-10-CM | POA: Diagnosis not present

## 2023-04-24 DIAGNOSIS — I252 Old myocardial infarction: Secondary | ICD-10-CM | POA: Diagnosis not present

## 2023-04-24 DIAGNOSIS — M80051D Age-related osteoporosis with current pathological fracture, right femur, subsequent encounter for fracture with routine healing: Secondary | ICD-10-CM | POA: Diagnosis not present

## 2023-04-24 DIAGNOSIS — Z96641 Presence of right artificial hip joint: Secondary | ICD-10-CM | POA: Diagnosis not present

## 2023-04-24 DIAGNOSIS — N189 Chronic kidney disease, unspecified: Secondary | ICD-10-CM | POA: Diagnosis not present

## 2023-04-24 DIAGNOSIS — Z792 Long term (current) use of antibiotics: Secondary | ICD-10-CM | POA: Diagnosis not present

## 2023-04-24 DIAGNOSIS — Z8546 Personal history of malignant neoplasm of prostate: Secondary | ICD-10-CM | POA: Diagnosis not present

## 2023-04-24 DIAGNOSIS — D631 Anemia in chronic kidney disease: Secondary | ICD-10-CM | POA: Diagnosis not present

## 2023-04-25 DIAGNOSIS — Z96641 Presence of right artificial hip joint: Secondary | ICD-10-CM | POA: Diagnosis not present

## 2023-04-25 DIAGNOSIS — Z7982 Long term (current) use of aspirin: Secondary | ICD-10-CM | POA: Diagnosis not present

## 2023-04-25 DIAGNOSIS — E1165 Type 2 diabetes mellitus with hyperglycemia: Secondary | ICD-10-CM | POA: Diagnosis not present

## 2023-04-25 DIAGNOSIS — Z794 Long term (current) use of insulin: Secondary | ICD-10-CM | POA: Diagnosis not present

## 2023-04-25 DIAGNOSIS — Z7952 Long term (current) use of systemic steroids: Secondary | ICD-10-CM | POA: Diagnosis not present

## 2023-04-25 DIAGNOSIS — Z8546 Personal history of malignant neoplasm of prostate: Secondary | ICD-10-CM | POA: Diagnosis not present

## 2023-04-25 DIAGNOSIS — D631 Anemia in chronic kidney disease: Secondary | ICD-10-CM | POA: Diagnosis not present

## 2023-04-25 DIAGNOSIS — E1122 Type 2 diabetes mellitus with diabetic chronic kidney disease: Secondary | ICD-10-CM | POA: Diagnosis not present

## 2023-04-25 DIAGNOSIS — E7800 Pure hypercholesterolemia, unspecified: Secondary | ICD-10-CM | POA: Diagnosis not present

## 2023-04-25 DIAGNOSIS — I129 Hypertensive chronic kidney disease with stage 1 through stage 4 chronic kidney disease, or unspecified chronic kidney disease: Secondary | ICD-10-CM | POA: Diagnosis not present

## 2023-04-25 DIAGNOSIS — N189 Chronic kidney disease, unspecified: Secondary | ICD-10-CM | POA: Diagnosis not present

## 2023-04-25 DIAGNOSIS — I2510 Atherosclerotic heart disease of native coronary artery without angina pectoris: Secondary | ICD-10-CM | POA: Diagnosis not present

## 2023-04-25 DIAGNOSIS — Z792 Long term (current) use of antibiotics: Secondary | ICD-10-CM | POA: Diagnosis not present

## 2023-04-25 DIAGNOSIS — K219 Gastro-esophageal reflux disease without esophagitis: Secondary | ICD-10-CM | POA: Diagnosis not present

## 2023-04-25 DIAGNOSIS — I252 Old myocardial infarction: Secondary | ICD-10-CM | POA: Diagnosis not present

## 2023-04-25 DIAGNOSIS — M80051D Age-related osteoporosis with current pathological fracture, right femur, subsequent encounter for fracture with routine healing: Secondary | ICD-10-CM | POA: Diagnosis not present

## 2023-04-27 DIAGNOSIS — I129 Hypertensive chronic kidney disease with stage 1 through stage 4 chronic kidney disease, or unspecified chronic kidney disease: Secondary | ICD-10-CM | POA: Diagnosis not present

## 2023-04-27 DIAGNOSIS — Z792 Long term (current) use of antibiotics: Secondary | ICD-10-CM | POA: Diagnosis not present

## 2023-04-27 DIAGNOSIS — Z7982 Long term (current) use of aspirin: Secondary | ICD-10-CM | POA: Diagnosis not present

## 2023-04-27 DIAGNOSIS — K219 Gastro-esophageal reflux disease without esophagitis: Secondary | ICD-10-CM | POA: Diagnosis not present

## 2023-04-27 DIAGNOSIS — Z96641 Presence of right artificial hip joint: Secondary | ICD-10-CM | POA: Diagnosis not present

## 2023-04-27 DIAGNOSIS — E7800 Pure hypercholesterolemia, unspecified: Secondary | ICD-10-CM | POA: Diagnosis not present

## 2023-04-27 DIAGNOSIS — I252 Old myocardial infarction: Secondary | ICD-10-CM | POA: Diagnosis not present

## 2023-04-27 DIAGNOSIS — Z794 Long term (current) use of insulin: Secondary | ICD-10-CM | POA: Diagnosis not present

## 2023-04-27 DIAGNOSIS — D631 Anemia in chronic kidney disease: Secondary | ICD-10-CM | POA: Diagnosis not present

## 2023-04-27 DIAGNOSIS — Z8546 Personal history of malignant neoplasm of prostate: Secondary | ICD-10-CM | POA: Diagnosis not present

## 2023-04-27 DIAGNOSIS — Z7952 Long term (current) use of systemic steroids: Secondary | ICD-10-CM | POA: Diagnosis not present

## 2023-04-27 DIAGNOSIS — M80051D Age-related osteoporosis with current pathological fracture, right femur, subsequent encounter for fracture with routine healing: Secondary | ICD-10-CM | POA: Diagnosis not present

## 2023-04-27 DIAGNOSIS — E1165 Type 2 diabetes mellitus with hyperglycemia: Secondary | ICD-10-CM | POA: Diagnosis not present

## 2023-04-27 DIAGNOSIS — N189 Chronic kidney disease, unspecified: Secondary | ICD-10-CM | POA: Diagnosis not present

## 2023-04-27 DIAGNOSIS — I2510 Atherosclerotic heart disease of native coronary artery without angina pectoris: Secondary | ICD-10-CM | POA: Diagnosis not present

## 2023-04-27 DIAGNOSIS — E1122 Type 2 diabetes mellitus with diabetic chronic kidney disease: Secondary | ICD-10-CM | POA: Diagnosis not present

## 2023-05-02 DIAGNOSIS — S72001A Fracture of unspecified part of neck of right femur, initial encounter for closed fracture: Secondary | ICD-10-CM | POA: Diagnosis not present

## 2023-05-03 DIAGNOSIS — Z7952 Long term (current) use of systemic steroids: Secondary | ICD-10-CM | POA: Diagnosis not present

## 2023-05-03 DIAGNOSIS — E1165 Type 2 diabetes mellitus with hyperglycemia: Secondary | ICD-10-CM | POA: Diagnosis not present

## 2023-05-03 DIAGNOSIS — Z792 Long term (current) use of antibiotics: Secondary | ICD-10-CM | POA: Diagnosis not present

## 2023-05-03 DIAGNOSIS — E1122 Type 2 diabetes mellitus with diabetic chronic kidney disease: Secondary | ICD-10-CM | POA: Diagnosis not present

## 2023-05-03 DIAGNOSIS — N189 Chronic kidney disease, unspecified: Secondary | ICD-10-CM | POA: Diagnosis not present

## 2023-05-03 DIAGNOSIS — M80051D Age-related osteoporosis with current pathological fracture, right femur, subsequent encounter for fracture with routine healing: Secondary | ICD-10-CM | POA: Diagnosis not present

## 2023-05-03 DIAGNOSIS — Z9181 History of falling: Secondary | ICD-10-CM | POA: Diagnosis not present

## 2023-05-03 DIAGNOSIS — D631 Anemia in chronic kidney disease: Secondary | ICD-10-CM | POA: Diagnosis not present

## 2023-05-03 DIAGNOSIS — I252 Old myocardial infarction: Secondary | ICD-10-CM | POA: Diagnosis not present

## 2023-05-03 DIAGNOSIS — Z8546 Personal history of malignant neoplasm of prostate: Secondary | ICD-10-CM | POA: Diagnosis not present

## 2023-05-03 DIAGNOSIS — I129 Hypertensive chronic kidney disease with stage 1 through stage 4 chronic kidney disease, or unspecified chronic kidney disease: Secondary | ICD-10-CM | POA: Diagnosis not present

## 2023-05-03 DIAGNOSIS — K219 Gastro-esophageal reflux disease without esophagitis: Secondary | ICD-10-CM | POA: Diagnosis not present

## 2023-05-03 DIAGNOSIS — Z96641 Presence of right artificial hip joint: Secondary | ICD-10-CM | POA: Diagnosis not present

## 2023-05-03 DIAGNOSIS — Z940 Kidney transplant status: Secondary | ICD-10-CM | POA: Diagnosis not present

## 2023-05-03 DIAGNOSIS — I2510 Atherosclerotic heart disease of native coronary artery without angina pectoris: Secondary | ICD-10-CM | POA: Diagnosis not present

## 2023-05-03 DIAGNOSIS — Z794 Long term (current) use of insulin: Secondary | ICD-10-CM | POA: Diagnosis not present

## 2023-05-03 DIAGNOSIS — Z7982 Long term (current) use of aspirin: Secondary | ICD-10-CM | POA: Diagnosis not present

## 2023-05-03 DIAGNOSIS — E7800 Pure hypercholesterolemia, unspecified: Secondary | ICD-10-CM | POA: Diagnosis not present

## 2023-05-04 DIAGNOSIS — E7800 Pure hypercholesterolemia, unspecified: Secondary | ICD-10-CM | POA: Diagnosis not present

## 2023-05-04 DIAGNOSIS — Z8546 Personal history of malignant neoplasm of prostate: Secondary | ICD-10-CM | POA: Diagnosis not present

## 2023-05-04 DIAGNOSIS — E1122 Type 2 diabetes mellitus with diabetic chronic kidney disease: Secondary | ICD-10-CM | POA: Diagnosis not present

## 2023-05-04 DIAGNOSIS — K219 Gastro-esophageal reflux disease without esophagitis: Secondary | ICD-10-CM | POA: Diagnosis not present

## 2023-05-04 DIAGNOSIS — I252 Old myocardial infarction: Secondary | ICD-10-CM | POA: Diagnosis not present

## 2023-05-04 DIAGNOSIS — Z96641 Presence of right artificial hip joint: Secondary | ICD-10-CM | POA: Diagnosis not present

## 2023-05-04 DIAGNOSIS — M80051D Age-related osteoporosis with current pathological fracture, right femur, subsequent encounter for fracture with routine healing: Secondary | ICD-10-CM | POA: Diagnosis not present

## 2023-05-04 DIAGNOSIS — I129 Hypertensive chronic kidney disease with stage 1 through stage 4 chronic kidney disease, or unspecified chronic kidney disease: Secondary | ICD-10-CM | POA: Diagnosis not present

## 2023-05-04 DIAGNOSIS — D631 Anemia in chronic kidney disease: Secondary | ICD-10-CM | POA: Diagnosis not present

## 2023-05-04 DIAGNOSIS — Z792 Long term (current) use of antibiotics: Secondary | ICD-10-CM | POA: Diagnosis not present

## 2023-05-04 DIAGNOSIS — E1165 Type 2 diabetes mellitus with hyperglycemia: Secondary | ICD-10-CM | POA: Diagnosis not present

## 2023-05-04 DIAGNOSIS — Z7982 Long term (current) use of aspirin: Secondary | ICD-10-CM | POA: Diagnosis not present

## 2023-05-04 DIAGNOSIS — I2510 Atherosclerotic heart disease of native coronary artery without angina pectoris: Secondary | ICD-10-CM | POA: Diagnosis not present

## 2023-05-04 DIAGNOSIS — Z794 Long term (current) use of insulin: Secondary | ICD-10-CM | POA: Diagnosis not present

## 2023-05-04 DIAGNOSIS — N189 Chronic kidney disease, unspecified: Secondary | ICD-10-CM | POA: Diagnosis not present

## 2023-05-04 DIAGNOSIS — Z7952 Long term (current) use of systemic steroids: Secondary | ICD-10-CM | POA: Diagnosis not present

## 2023-05-09 DIAGNOSIS — I2510 Atherosclerotic heart disease of native coronary artery without angina pectoris: Secondary | ICD-10-CM | POA: Diagnosis not present

## 2023-05-09 DIAGNOSIS — E7800 Pure hypercholesterolemia, unspecified: Secondary | ICD-10-CM | POA: Diagnosis not present

## 2023-05-09 DIAGNOSIS — E1122 Type 2 diabetes mellitus with diabetic chronic kidney disease: Secondary | ICD-10-CM | POA: Diagnosis not present

## 2023-05-09 DIAGNOSIS — Z792 Long term (current) use of antibiotics: Secondary | ICD-10-CM | POA: Diagnosis not present

## 2023-05-09 DIAGNOSIS — Z7982 Long term (current) use of aspirin: Secondary | ICD-10-CM | POA: Diagnosis not present

## 2023-05-09 DIAGNOSIS — I252 Old myocardial infarction: Secondary | ICD-10-CM | POA: Diagnosis not present

## 2023-05-09 DIAGNOSIS — Z794 Long term (current) use of insulin: Secondary | ICD-10-CM | POA: Diagnosis not present

## 2023-05-09 DIAGNOSIS — Z7952 Long term (current) use of systemic steroids: Secondary | ICD-10-CM | POA: Diagnosis not present

## 2023-05-09 DIAGNOSIS — Z8546 Personal history of malignant neoplasm of prostate: Secondary | ICD-10-CM | POA: Diagnosis not present

## 2023-05-09 DIAGNOSIS — I129 Hypertensive chronic kidney disease with stage 1 through stage 4 chronic kidney disease, or unspecified chronic kidney disease: Secondary | ICD-10-CM | POA: Diagnosis not present

## 2023-05-09 DIAGNOSIS — D631 Anemia in chronic kidney disease: Secondary | ICD-10-CM | POA: Diagnosis not present

## 2023-05-09 DIAGNOSIS — E1165 Type 2 diabetes mellitus with hyperglycemia: Secondary | ICD-10-CM | POA: Diagnosis not present

## 2023-05-09 DIAGNOSIS — N189 Chronic kidney disease, unspecified: Secondary | ICD-10-CM | POA: Diagnosis not present

## 2023-05-09 DIAGNOSIS — K219 Gastro-esophageal reflux disease without esophagitis: Secondary | ICD-10-CM | POA: Diagnosis not present

## 2023-05-09 DIAGNOSIS — Z96641 Presence of right artificial hip joint: Secondary | ICD-10-CM | POA: Diagnosis not present

## 2023-05-09 DIAGNOSIS — M80051D Age-related osteoporosis with current pathological fracture, right femur, subsequent encounter for fracture with routine healing: Secondary | ICD-10-CM | POA: Diagnosis not present

## 2023-05-10 DIAGNOSIS — S72001A Fracture of unspecified part of neck of right femur, initial encounter for closed fracture: Secondary | ICD-10-CM | POA: Diagnosis not present

## 2023-05-16 DIAGNOSIS — E113411 Type 2 diabetes mellitus with severe nonproliferative diabetic retinopathy with macular edema, right eye: Secondary | ICD-10-CM | POA: Diagnosis not present

## 2023-05-16 DIAGNOSIS — Z79621 Long term (current) use of calcineurin inhibitor: Secondary | ICD-10-CM | POA: Diagnosis not present

## 2023-05-16 DIAGNOSIS — Z5181 Encounter for therapeutic drug level monitoring: Secondary | ICD-10-CM | POA: Diagnosis not present

## 2023-05-16 DIAGNOSIS — H2702 Aphakia, left eye: Secondary | ICD-10-CM | POA: Diagnosis not present

## 2023-05-16 DIAGNOSIS — Z940 Kidney transplant status: Secondary | ICD-10-CM | POA: Diagnosis not present

## 2023-05-16 DIAGNOSIS — H33052 Total retinal detachment, left eye: Secondary | ICD-10-CM | POA: Diagnosis not present

## 2023-05-16 DIAGNOSIS — H2511 Age-related nuclear cataract, right eye: Secondary | ICD-10-CM | POA: Diagnosis not present

## 2023-05-16 DIAGNOSIS — H211X2 Other vascular disorders of iris and ciliary body, left eye: Secondary | ICD-10-CM | POA: Diagnosis not present

## 2023-05-16 DIAGNOSIS — Z79899 Other long term (current) drug therapy: Secondary | ICD-10-CM | POA: Diagnosis not present

## 2023-05-16 DIAGNOSIS — Z794 Long term (current) use of insulin: Secondary | ICD-10-CM | POA: Diagnosis not present

## 2023-05-16 DIAGNOSIS — R7989 Other specified abnormal findings of blood chemistry: Secondary | ICD-10-CM | POA: Diagnosis not present

## 2023-05-17 ENCOUNTER — Ambulatory Visit: Payer: Medicare Other | Admitting: Internal Medicine

## 2023-05-17 ENCOUNTER — Encounter: Payer: Self-pay | Admitting: Internal Medicine

## 2023-05-17 VITALS — BP 122/74 | HR 78 | Temp 98.5°F | Resp 17 | Ht 70.0 in | Wt 171.2 lb

## 2023-05-17 DIAGNOSIS — Z8546 Personal history of malignant neoplasm of prostate: Secondary | ICD-10-CM | POA: Diagnosis not present

## 2023-05-17 DIAGNOSIS — Z940 Kidney transplant status: Secondary | ICD-10-CM | POA: Diagnosis not present

## 2023-05-17 DIAGNOSIS — M80051D Age-related osteoporosis with current pathological fracture, right femur, subsequent encounter for fracture with routine healing: Secondary | ICD-10-CM | POA: Diagnosis not present

## 2023-05-17 DIAGNOSIS — R748 Abnormal levels of other serum enzymes: Secondary | ICD-10-CM | POA: Diagnosis not present

## 2023-05-17 DIAGNOSIS — N189 Chronic kidney disease, unspecified: Secondary | ICD-10-CM | POA: Diagnosis not present

## 2023-05-17 DIAGNOSIS — E1165 Type 2 diabetes mellitus with hyperglycemia: Secondary | ICD-10-CM

## 2023-05-17 DIAGNOSIS — E7800 Pure hypercholesterolemia, unspecified: Secondary | ICD-10-CM | POA: Diagnosis not present

## 2023-05-17 DIAGNOSIS — K219 Gastro-esophageal reflux disease without esophagitis: Secondary | ICD-10-CM | POA: Diagnosis not present

## 2023-05-17 DIAGNOSIS — Z7952 Long term (current) use of systemic steroids: Secondary | ICD-10-CM | POA: Diagnosis not present

## 2023-05-17 DIAGNOSIS — I129 Hypertensive chronic kidney disease with stage 1 through stage 4 chronic kidney disease, or unspecified chronic kidney disease: Secondary | ICD-10-CM | POA: Diagnosis not present

## 2023-05-17 DIAGNOSIS — Z96641 Presence of right artificial hip joint: Secondary | ICD-10-CM | POA: Diagnosis not present

## 2023-05-17 DIAGNOSIS — E1122 Type 2 diabetes mellitus with diabetic chronic kidney disease: Secondary | ICD-10-CM | POA: Diagnosis not present

## 2023-05-17 DIAGNOSIS — Z792 Long term (current) use of antibiotics: Secondary | ICD-10-CM | POA: Diagnosis not present

## 2023-05-17 DIAGNOSIS — I2510 Atherosclerotic heart disease of native coronary artery without angina pectoris: Secondary | ICD-10-CM | POA: Diagnosis not present

## 2023-05-17 DIAGNOSIS — Z794 Long term (current) use of insulin: Secondary | ICD-10-CM | POA: Diagnosis not present

## 2023-05-17 DIAGNOSIS — D631 Anemia in chronic kidney disease: Secondary | ICD-10-CM | POA: Diagnosis not present

## 2023-05-17 DIAGNOSIS — I252 Old myocardial infarction: Secondary | ICD-10-CM | POA: Diagnosis not present

## 2023-05-17 DIAGNOSIS — Z7982 Long term (current) use of aspirin: Secondary | ICD-10-CM | POA: Diagnosis not present

## 2023-05-17 NOTE — Progress Notes (Signed)
 Office Visit  Subjective   Patient ID: Jason Zimmerman   DOB: [birth year]   Age: 68 y.o.   MRN: [id number]   Chief Complaint Chief Complaint  Patient presents with   Follow-up     History of Present Illness Jason Zimmerman is a 68 yo male who returns today for a month followup where I saw him in Dec for a hospital followup where he was admitted to [facility] from 03/27/2023 until 03/31/2023 after he had a fall and a resultant right femoral neck fracture.  Ortho was consulted and the patient underwent left hip hemiarthoplasty on 03/28/2023.  He had some post op anemia where his HgB dropped from 12.1 to 8.7.  He was asymptomatic and he did not need transfusion and sent him home on iron.  He is still getting homehealth PT.  He did see his GI doctor on 04/19/2023.  They did blood work at that time and noted he had some elevated liver enzymes with suspicion for DILI based on temp relationship of liver enzyme elevation and medications.  He was previosly on Bactrim and stopped this and his liver enzymes down trended this year.  Bactrim was restarted and enzymes were uptrending.  Lipitor was stopped, restarted, then most recently stopped again.  He is not on cholesterol meds at this time.  He saw [facility] team yesterday for his history of kidney transplant.  They did blood work where his HgB is now 13.7.  They stopped him off his iron and his bactrimmeds.  His AST was 63 and ALT 82 yesterday.  His tacrolimus  levels were normal.    The patient is a 68 year old Caucasian/White male who returns for a follow-up visit for his T2 diabetes.  He was diagnosed with diabetes in 2004.  Over the interim, he did seen endocrinology on 02/13/2023 and his HgBA1c was 13.7%.  He tells me that his endocrinologist contacted him about 3 weeks ago and he was restarted him on ozempic where he is currently on ozempic 0.5mg  weekly.  He has had noncompliance and not taking his SSI meal time coverage.  They recommended him taking his  ozempic regularly and to take Lyumjev 3 times per day.   He remains lantus 48 Units daily, lyumjev sliding scale which he takes 2 times per day and ozempic 0.5 mg subcut weekly.  He is not walking as much as they would like.  He specifically denies unexplained abdominal pain, nausea or vomiting, or documented hypoglycemia. He uses the Jason Zimmerman CGM and his sugars range somewhere between 90-280's.  His last HgbA1c was done on 05/16/2023 and and was 9.4%. He does have long term complications of CAD/PVD, diabetic retinopathy and diabetic nephropathy but no diabetic neuropathy.  His last dilated eye exam by Jason Zimmerman in [place] was done on 11/24/2022 where he has severee proliferative retinopathy and macular edema.  They have been doing injections in his eyes.   The patient has a history of ESRD secondary to T2 diabetes and HTN where he underwent renal transplantation in 04/10/2022.  His last visit with the transplant team was on 05/16/2023.  They have been checking his prograf  levels and these look good and they felt his tranplant was stable.  He remains on prednisone  5mg  daily and prograf .  His neprhologist is Jason Zimmerman at [facility].  The patient continues to avoid NSAIDS at this time.  The transplant team also has noted that he has had elevated LFT's in the past.  He  is on a statin, bactrim, fluconzaole initially on hold, but Bactrim re-started at prior visit.  He had a liver US  at [facility] showing steatosis. Viral hepatitis titers were negative.  They have been following his liver enzymes where his last liver enzymes as described above.     Past Medical History Past Medical History:  Diagnosis Date   Anemia associated with chronic renal failure    CKD (chronic kidney disease), stage V (HCC)    Coronary disease    Diabetic nephropathy associated with type 2 diabetes mellitus (HCC)    Diabetic retinopathy (HCC)    Fatty liver    per patient's wife   GERD (gastroesophageal reflux  disease) 03/14/2022   no meds   Heart attack (HCC) 10/19/2010   History of prostate cancer    Hyperlipidemia    Hypertension, renal    Ocular hypertension    Prostate cancer (HCC) 2011   RTA (renal tubular acidosis)    Secondary hyperparathyroidism, renal (HCC)    Vitamin D deficiency      Allergies Allergies  Allergen Reactions   Cefazolin  Anaphylaxis    Administered for surgical prophylaxis along with platelets and pt subsequently had presumed allergic reaction with hypotension, tachycardia elevate peak pressures     Medications  Current Outpatient Medications:    acetaminophen  (TYLENOL ) 500 MG tablet, Take 1,000 mg by mouth every 6 (six) hours as needed (pain.)., Disp: , Rfl:    aspirin EC 81 MG tablet, Take 81 mg by mouth at bedtime., Disp: , Rfl:    carvedilol (COREG) 3.125 MG tablet, Take 3.125 mg by mouth 2 (two) times daily with a meal., Disp: , Rfl:    ezetimibe (ZETIA) 10 MG tablet, Take 10 mg by mouth in the morning., Disp: , Rfl:    glucose blood (FREESTYLE TEST STRIPS) test strip, 1 each by Other route 2 (two) times daily as needed for other. PRN, Disp: 102 each, Rfl: 5   insulin  glargine (LANTUS) 100 UNIT/ML Solostar Pen, Inject 48 Units into the skin at bedtime., Disp: , Rfl:    LYUMJEV KWIKPEN 100 UNIT/ML KwikPen, Inject into the skin., Disp: , Rfl:    mycophenolate (MYFORTIC) 180 MG EC tablet, Take 180 mg by mouth 2 (two) times daily., Disp: , Rfl:    nitroGLYCERIN (NITROSTAT) 0.4 MG SL tablet, Place 0.4 mg under the tongue every 5 (five) minutes x 3 doses as needed for chest pain., Disp: , Rfl:    omeprazole (PRILOSEC) 20 MG capsule, Take 20 mg by mouth daily., Disp: , Rfl:    oxyCODONE  (OXY IR/ROXICODONE ) 5 MG immediate release tablet, Take 5 mg by mouth every 6 (six) hours as needed., Disp: , Rfl:    predniSONE  (DELTASONE ) 5 MG tablet, Take 5 mg by mouth daily with breakfast., Disp: , Rfl:    predniSONE  (DELTASONE ) 5 MG tablet, Take 1 tablet by mouth daily.,  Disp: , Rfl:    SENNA S 8.6-50 MG tablet, Take 2 tablets by mouth as needed., Disp: , Rfl:    tacrolimus  (PROGRAF ) 0.5 MG capsule, Take 0.5 mg by mouth daily. Total a day is 1.5mg  (takes 1mg  BID and 0.5mg  once a day), Disp: , Rfl:    tacrolimus  (PROGRAF ) 1 MG capsule, Take 1.5 mg by mouth 2 (two) times daily., Disp: , Rfl:    timolol  (TIMOPTIC ) 0.5 % ophthalmic solution, Place 1 drop into the left eye 2 (two) times daily., Disp: 10 mL, Rfl: 2   Review of Systems Review of Systems  Constitutional:  Negative for chills, fever and malaise/fatigue.  Eyes:  Negative for blurred vision and double vision.  Respiratory:  Negative for cough and shortness of breath.   Cardiovascular:  Negative for chest pain, palpitations and leg swelling.  Gastrointestinal:  Negative for abdominal pain, constipation, diarrhea, nausea and vomiting.  Genitourinary:  Negative for frequency.  Musculoskeletal:  Negative for myalgias.  Skin:  Negative for itching and rash.  Neurological:  Negative for dizziness, weakness and headaches.  Endo/Heme/Allergies:  Negative for polydipsia.       Objective:    Vitals BP 122/74   Pulse 78   Temp 98.5 F (36.9 C)   Resp 17   Ht 5\' 10"  (1.778 m)   Wt 171 lb 3.2 oz (77.7 kg)   SpO2 98%   BMI 24.56 kg/m    Physical Examination Physical Exam Constitutional:      Appearance: Normal appearance. He is not ill-appearing.  Cardiovascular:     Rate and Rhythm: Normal rate and regular rhythm.     Pulses: Normal pulses.     Heart sounds: No murmur heard.    No friction rub. No gallop.  Pulmonary:     Effort: Pulmonary effort is normal. No respiratory distress.     Breath sounds: No wheezing, rhonchi or rales.  Abdominal:     General: Abdomen is flat. Bowel sounds are normal. There is no distension.     Palpations: Abdomen is soft.     Tenderness: There is no abdominal tenderness.  Musculoskeletal:     Right lower leg: No edema.     Left lower leg: No edema.  Skin:     General: Skin is warm and dry.     Findings: No rash.  Neurological:     General: No focal deficit present.     Mental Status: He is alert and oriented to person, place, and time.  Psychiatric:        Mood and Affect: Mood normal.        Behavior: Behavior normal.        Assessment & Plan:   Elevated liver enzymes GI as well as his transplant team have been following this.  He has had a history of fatty liver disease.  His enzymes from yesterday where mildly elevated.  They stopped him off of bactrim and he is not on his cholesterol med anymore.   Kidney transplanted I reviewed his labs from the transplant team from yestereay.  It looks like his transplant is stable.  He has an appointment with nephrology in 08/2023.  Poorly controlled diabetes mellitus (HCC) His HgBa1c was 9.4% yesterday but he just restarted his ozempic about 3 weeks ago.  He goes back to see endocrinology in 08/2022.  I will see him back in 3 months.  If his A1c remains elevated at that time, we will go up on his dose of ozempic.    Return in about 3 months (around 08/15/2023).   Wayne Haines, MD

## 2023-05-17 NOTE — Assessment & Plan Note (Signed)
 GI as well as his transplant team have been following this.  He has had a history of fatty liver disease.  His enzymes from yesterday where mildly elevated.  They stopped him off of bactrim and he is not on his cholesterol med anymore.

## 2023-05-17 NOTE — Assessment & Plan Note (Signed)
 I reviewed his labs from the transplant team from yestereay.  It looks like his transplant is stable.  He has an appointment with nephrology in 08/2023.

## 2023-05-17 NOTE — Assessment & Plan Note (Signed)
 His HgBa1c was 9.4% yesterday but he just restarted his ozempic about 3 weeks ago.  He goes back to see endocrinology in 08/2022.  I will see him back in 3 months.  If his A1c remains elevated at that time, we will go up on his dose of ozempic.

## 2023-05-27 DIAGNOSIS — E1165 Type 2 diabetes mellitus with hyperglycemia: Secondary | ICD-10-CM | POA: Diagnosis not present

## 2023-05-27 DIAGNOSIS — Z792 Long term (current) use of antibiotics: Secondary | ICD-10-CM | POA: Diagnosis not present

## 2023-05-27 DIAGNOSIS — Z7982 Long term (current) use of aspirin: Secondary | ICD-10-CM | POA: Diagnosis not present

## 2023-05-27 DIAGNOSIS — E1122 Type 2 diabetes mellitus with diabetic chronic kidney disease: Secondary | ICD-10-CM | POA: Diagnosis not present

## 2023-05-27 DIAGNOSIS — N189 Chronic kidney disease, unspecified: Secondary | ICD-10-CM | POA: Diagnosis not present

## 2023-05-27 DIAGNOSIS — D631 Anemia in chronic kidney disease: Secondary | ICD-10-CM | POA: Diagnosis not present

## 2023-05-27 DIAGNOSIS — I252 Old myocardial infarction: Secondary | ICD-10-CM | POA: Diagnosis not present

## 2023-05-27 DIAGNOSIS — I2510 Atherosclerotic heart disease of native coronary artery without angina pectoris: Secondary | ICD-10-CM | POA: Diagnosis not present

## 2023-05-27 DIAGNOSIS — K219 Gastro-esophageal reflux disease without esophagitis: Secondary | ICD-10-CM | POA: Diagnosis not present

## 2023-05-27 DIAGNOSIS — Z96641 Presence of right artificial hip joint: Secondary | ICD-10-CM | POA: Diagnosis not present

## 2023-05-27 DIAGNOSIS — I129 Hypertensive chronic kidney disease with stage 1 through stage 4 chronic kidney disease, or unspecified chronic kidney disease: Secondary | ICD-10-CM | POA: Diagnosis not present

## 2023-05-27 DIAGNOSIS — Z794 Long term (current) use of insulin: Secondary | ICD-10-CM | POA: Diagnosis not present

## 2023-05-27 DIAGNOSIS — M80051D Age-related osteoporosis with current pathological fracture, right femur, subsequent encounter for fracture with routine healing: Secondary | ICD-10-CM | POA: Diagnosis not present

## 2023-05-27 DIAGNOSIS — Z7952 Long term (current) use of systemic steroids: Secondary | ICD-10-CM | POA: Diagnosis not present

## 2023-05-27 DIAGNOSIS — Z8546 Personal history of malignant neoplasm of prostate: Secondary | ICD-10-CM | POA: Diagnosis not present

## 2023-05-27 DIAGNOSIS — E7800 Pure hypercholesterolemia, unspecified: Secondary | ICD-10-CM | POA: Diagnosis not present

## 2023-05-30 DIAGNOSIS — E1122 Type 2 diabetes mellitus with diabetic chronic kidney disease: Secondary | ICD-10-CM | POA: Diagnosis not present

## 2023-05-30 DIAGNOSIS — Z794 Long term (current) use of insulin: Secondary | ICD-10-CM | POA: Diagnosis not present

## 2023-05-30 DIAGNOSIS — I129 Hypertensive chronic kidney disease with stage 1 through stage 4 chronic kidney disease, or unspecified chronic kidney disease: Secondary | ICD-10-CM | POA: Diagnosis not present

## 2023-05-30 DIAGNOSIS — Z8546 Personal history of malignant neoplasm of prostate: Secondary | ICD-10-CM | POA: Diagnosis not present

## 2023-05-30 DIAGNOSIS — I252 Old myocardial infarction: Secondary | ICD-10-CM | POA: Diagnosis not present

## 2023-05-30 DIAGNOSIS — E7800 Pure hypercholesterolemia, unspecified: Secondary | ICD-10-CM | POA: Diagnosis not present

## 2023-05-30 DIAGNOSIS — I2510 Atherosclerotic heart disease of native coronary artery without angina pectoris: Secondary | ICD-10-CM | POA: Diagnosis not present

## 2023-05-30 DIAGNOSIS — N189 Chronic kidney disease, unspecified: Secondary | ICD-10-CM | POA: Diagnosis not present

## 2023-05-30 DIAGNOSIS — Z7952 Long term (current) use of systemic steroids: Secondary | ICD-10-CM | POA: Diagnosis not present

## 2023-05-30 DIAGNOSIS — K219 Gastro-esophageal reflux disease without esophagitis: Secondary | ICD-10-CM | POA: Diagnosis not present

## 2023-05-30 DIAGNOSIS — M80051D Age-related osteoporosis with current pathological fracture, right femur, subsequent encounter for fracture with routine healing: Secondary | ICD-10-CM | POA: Diagnosis not present

## 2023-05-30 DIAGNOSIS — E1165 Type 2 diabetes mellitus with hyperglycemia: Secondary | ICD-10-CM | POA: Diagnosis not present

## 2023-05-30 DIAGNOSIS — Z792 Long term (current) use of antibiotics: Secondary | ICD-10-CM | POA: Diagnosis not present

## 2023-05-30 DIAGNOSIS — Z96641 Presence of right artificial hip joint: Secondary | ICD-10-CM | POA: Diagnosis not present

## 2023-05-30 DIAGNOSIS — Z7982 Long term (current) use of aspirin: Secondary | ICD-10-CM | POA: Diagnosis not present

## 2023-05-30 DIAGNOSIS — D631 Anemia in chronic kidney disease: Secondary | ICD-10-CM | POA: Diagnosis not present

## 2023-06-08 ENCOUNTER — Encounter: Payer: Self-pay | Admitting: Student

## 2023-06-08 ENCOUNTER — Ambulatory Visit: Payer: Medicare Other | Admitting: Student

## 2023-06-08 VITALS — BP 138/72 | HR 91 | Temp 99.7°F | Resp 20 | Ht 70.0 in

## 2023-06-08 DIAGNOSIS — R059 Cough, unspecified: Secondary | ICD-10-CM | POA: Diagnosis not present

## 2023-06-08 LAB — POC COVID19 BINAXNOW: SARS Coronavirus 2 Ag: POSITIVE — AB

## 2023-06-08 LAB — POC INFLUENZA A&B (BINAX/QUICKVUE)
Influenza A, POC: NEGATIVE
Influenza B, POC: NEGATIVE

## 2023-06-08 NOTE — Assessment & Plan Note (Signed)
 This was a car visit patient is COVID positive, he is not a candidate for Paxlovid due to drug interactions.  Instructions provided to use supportive care measures along with saline nasal spray or fluticasone for postnasal drip.  Patient should return if symptoms do not improve.  COVID instructions provided.

## 2023-06-08 NOTE — Progress Notes (Signed)
 Acute Office Visit  Subjective:     Patient ID: Jason Zimmerman, male    DOB: 1955-08-24, 68 y.o.   MRN: [id number]  Chief Complaint  Patient presents with   Cough    X 3 weeks, Congested cough, white/clear mucus, sneezing, PND, clear/bloody nasal discharge, scratchy throat, left jaw under ear hurts, sinus pressure, denies SOB. Used Tylenol  1:00 am this morning and used otc allergy pills about 4 days ago.    HPI  Subjective:     Jason Zimmerman is a 68 y.o. male here for evaluation of a cough. Onset of symptoms was 2 weeks ago. Symptoms have been unchanged since that time. The cough is dry and is aggravated by reclining position. Associated symptoms include: chills and postnasal drip. Patient does not have a history of asthma.  Patient reports sneezing and cough with white clear mucus along with sinus pressure.  He denies chest pain shortness of breath or wheezing.  Using over-the-counter allergy medication and Tylenol .  Review of Systems  Constitutional:  Positive for chills.  HENT:  Positive for congestion and sinus pain.   Eyes: Negative.   Respiratory:  Positive for cough and sputum production. Negative for shortness of breath and wheezing.   Cardiovascular: Negative.   Gastrointestinal: Negative.   Genitourinary: Negative.   Musculoskeletal: Negative.   Skin: Negative.   Neurological: Negative.         Objective:    BP 138/72 (BP Location: Right Arm, Patient Position: Standing, Cuff Size: Normal)   Pulse 91   Temp 99.7 F (37.6 C) (Temporal)   Resp 20   Ht 5\' 10"  (1.778 m)   BMI 24.56 kg/m    Physical Exam Vitals reviewed.  Constitutional:      Appearance: He is ill-appearing.  HENT:     Head: Normocephalic.     Nose: Rhinorrhea present. Rhinorrhea is clear.     Right Sinus: Frontal sinus tenderness present.     Left Sinus: Frontal sinus tenderness present.     Mouth/Throat:     Mouth: Mucous membranes are moist.     Pharynx: Postnasal drip present.      Tonsils: No tonsillar exudate or tonsillar abscesses.  Cardiovascular:     Rate and Rhythm: Normal rate and regular rhythm.     Pulses: Normal pulses.     Heart sounds: Normal heart sounds.  Pulmonary:     Effort: Pulmonary effort is normal. No respiratory distress.     Breath sounds: Normal breath sounds. No stridor. No wheezing or rhonchi.  Chest:     Chest wall: No tenderness.  Skin:    General: Skin is warm and dry.     Capillary Refill: Capillary refill takes less than 2 seconds.  Neurological:     General: No focal deficit present.     Mental Status: He is alert and oriented to person, place, and time.  Psychiatric:        Mood and Affect: Mood normal.        Behavior: Behavior normal.     Results for orders placed or performed in visit on 06/08/23  POC COVID-19 BinaxNow  Result Value Ref Range   SARS Coronavirus 2 Ag Positive (A) Negative  POC Influenza A&B(BINAX/QUICKVUE)  Result Value Ref Range   Influenza A, POC Negative Negative   Influenza B, POC Negative Negative        Assessment & Plan:   Problem List Items Addressed This Visit     Cough in adult -  Primary   This was a car visit patient is COVID positive, he is not a candidate for Paxlovid due to drug interactions.  Instructions provided to use supportive care measures along with saline nasal spray or fluticasone for postnasal drip.  Patient should return if symptoms do not improve.  COVID instructions provided.      Relevant Orders   POC COVID-19 BinaxNow (Completed)   POC Influenza A&B(BINAX/QUICKVUE) (Completed)    No orders of the defined types were placed in this encounter.   Return if symptoms worsen or fail to improve.  Nasario Badder, NP

## 2023-06-08 NOTE — Patient Instructions (Signed)
 What to use for symptom management: pseudoephedrine (for decongestant) antihistamine (like Zyrtec, Claritin, or Allegra) (to dry up) Guaifenesin (to thin out and break up mucus) Dextromethorphan (if coughing) acetaminophen (for aches, pain, or fever) Coricidin HBP (use for nasal congestion if you have high blood pressure)

## 2023-06-13 DIAGNOSIS — D638 Anemia in other chronic diseases classified elsewhere: Secondary | ICD-10-CM | POA: Diagnosis not present

## 2023-06-13 DIAGNOSIS — D649 Anemia, unspecified: Secondary | ICD-10-CM | POA: Diagnosis not present

## 2023-06-13 DIAGNOSIS — K029 Dental caries, unspecified: Secondary | ICD-10-CM | POA: Diagnosis not present

## 2023-06-13 DIAGNOSIS — R918 Other nonspecific abnormal finding of lung field: Secondary | ICD-10-CM | POA: Diagnosis not present

## 2023-06-13 DIAGNOSIS — I252 Old myocardial infarction: Secondary | ICD-10-CM | POA: Diagnosis not present

## 2023-06-13 DIAGNOSIS — I951 Orthostatic hypotension: Secondary | ICD-10-CM | POA: Diagnosis not present

## 2023-06-13 DIAGNOSIS — Z79624 Long term (current) use of inhibitors of nucleotide synthesis: Secondary | ICD-10-CM | POA: Diagnosis not present

## 2023-06-13 DIAGNOSIS — R040 Epistaxis: Secondary | ICD-10-CM | POA: Diagnosis not present

## 2023-06-13 DIAGNOSIS — Z8674 Personal history of sudden cardiac arrest: Secondary | ICD-10-CM | POA: Diagnosis not present

## 2023-06-13 DIAGNOSIS — Z5181 Encounter for therapeutic drug level monitoring: Secondary | ICD-10-CM | POA: Diagnosis not present

## 2023-06-13 DIAGNOSIS — J019 Acute sinusitis, unspecified: Secondary | ICD-10-CM | POA: Diagnosis not present

## 2023-06-13 DIAGNOSIS — Z4822 Encounter for aftercare following kidney transplant: Secondary | ICD-10-CM | POA: Diagnosis not present

## 2023-06-13 DIAGNOSIS — Z955 Presence of coronary angioplasty implant and graft: Secondary | ICD-10-CM | POA: Diagnosis not present

## 2023-06-13 DIAGNOSIS — N1832 Chronic kidney disease, stage 3b: Secondary | ICD-10-CM | POA: Diagnosis not present

## 2023-06-13 DIAGNOSIS — J0100 Acute maxillary sinusitis, unspecified: Secondary | ICD-10-CM | POA: Diagnosis not present

## 2023-06-13 DIAGNOSIS — U071 COVID-19: Secondary | ICD-10-CM | POA: Diagnosis not present

## 2023-06-13 DIAGNOSIS — J0140 Acute pansinusitis, unspecified: Secondary | ICD-10-CM | POA: Diagnosis not present

## 2023-06-13 DIAGNOSIS — X58XXXA Exposure to other specified factors, initial encounter: Secondary | ICD-10-CM | POA: Diagnosis not present

## 2023-06-13 DIAGNOSIS — J0101 Acute recurrent maxillary sinusitis: Secondary | ICD-10-CM | POA: Diagnosis not present

## 2023-06-13 DIAGNOSIS — E785 Hyperlipidemia, unspecified: Secondary | ICD-10-CM | POA: Diagnosis not present

## 2023-06-13 DIAGNOSIS — R197 Diarrhea, unspecified: Secondary | ICD-10-CM | POA: Diagnosis not present

## 2023-06-13 DIAGNOSIS — N179 Acute kidney failure, unspecified: Secondary | ICD-10-CM | POA: Diagnosis not present

## 2023-06-13 DIAGNOSIS — B999 Unspecified infectious disease: Secondary | ICD-10-CM | POA: Diagnosis not present

## 2023-06-13 DIAGNOSIS — Z8546 Personal history of malignant neoplasm of prostate: Secondary | ICD-10-CM | POA: Diagnosis not present

## 2023-06-13 DIAGNOSIS — I10 Essential (primary) hypertension: Secondary | ICD-10-CM | POA: Diagnosis not present

## 2023-06-13 DIAGNOSIS — E1165 Type 2 diabetes mellitus with hyperglycemia: Secondary | ICD-10-CM | POA: Diagnosis not present

## 2023-06-13 DIAGNOSIS — Z7982 Long term (current) use of aspirin: Secondary | ICD-10-CM | POA: Diagnosis not present

## 2023-06-13 DIAGNOSIS — Z79621 Long term (current) use of calcineurin inhibitor: Secondary | ICD-10-CM | POA: Diagnosis not present

## 2023-06-13 DIAGNOSIS — B9689 Other specified bacterial agents as the cause of diseases classified elsewhere: Secondary | ICD-10-CM | POA: Diagnosis not present

## 2023-06-13 DIAGNOSIS — I2510 Atherosclerotic heart disease of native coronary artery without angina pectoris: Secondary | ICD-10-CM | POA: Diagnosis not present

## 2023-06-13 DIAGNOSIS — Z940 Kidney transplant status: Secondary | ICD-10-CM | POA: Diagnosis not present

## 2023-06-13 DIAGNOSIS — J849 Interstitial pulmonary disease, unspecified: Secondary | ICD-10-CM | POA: Diagnosis not present

## 2023-06-13 DIAGNOSIS — Z794 Long term (current) use of insulin: Secondary | ICD-10-CM | POA: Diagnosis not present

## 2023-06-13 DIAGNOSIS — D84821 Immunodeficiency due to drugs: Secondary | ICD-10-CM | POA: Diagnosis not present

## 2023-06-13 DIAGNOSIS — I959 Hypotension, unspecified: Secondary | ICD-10-CM | POA: Diagnosis not present

## 2023-06-13 DIAGNOSIS — Z7952 Long term (current) use of systemic steroids: Secondary | ICD-10-CM | POA: Diagnosis not present

## 2023-06-13 DIAGNOSIS — S025XXA Fracture of tooth (traumatic), initial encounter for closed fracture: Secondary | ICD-10-CM | POA: Diagnosis not present

## 2023-06-14 DIAGNOSIS — R040 Epistaxis: Secondary | ICD-10-CM | POA: Diagnosis not present

## 2023-06-14 DIAGNOSIS — Z8674 Personal history of sudden cardiac arrest: Secondary | ICD-10-CM | POA: Diagnosis not present

## 2023-06-14 DIAGNOSIS — B9689 Other specified bacterial agents as the cause of diseases classified elsewhere: Secondary | ICD-10-CM | POA: Diagnosis not present

## 2023-06-14 DIAGNOSIS — S025XXA Fracture of tooth (traumatic), initial encounter for closed fracture: Secondary | ICD-10-CM | POA: Diagnosis not present

## 2023-06-14 DIAGNOSIS — I951 Orthostatic hypotension: Secondary | ICD-10-CM | POA: Diagnosis not present

## 2023-06-14 DIAGNOSIS — Z79621 Long term (current) use of calcineurin inhibitor: Secondary | ICD-10-CM | POA: Diagnosis not present

## 2023-06-14 DIAGNOSIS — Z8546 Personal history of malignant neoplasm of prostate: Secondary | ICD-10-CM | POA: Diagnosis not present

## 2023-06-14 DIAGNOSIS — D638 Anemia in other chronic diseases classified elsewhere: Secondary | ICD-10-CM | POA: Diagnosis not present

## 2023-06-14 DIAGNOSIS — I959 Hypotension, unspecified: Secondary | ICD-10-CM | POA: Diagnosis not present

## 2023-06-14 DIAGNOSIS — E1165 Type 2 diabetes mellitus with hyperglycemia: Secondary | ICD-10-CM | POA: Diagnosis not present

## 2023-06-14 DIAGNOSIS — N1832 Chronic kidney disease, stage 3b: Secondary | ICD-10-CM | POA: Diagnosis not present

## 2023-06-14 DIAGNOSIS — X58XXXA Exposure to other specified factors, initial encounter: Secondary | ICD-10-CM | POA: Diagnosis not present

## 2023-06-14 DIAGNOSIS — J849 Interstitial pulmonary disease, unspecified: Secondary | ICD-10-CM | POA: Diagnosis not present

## 2023-06-14 DIAGNOSIS — J0101 Acute recurrent maxillary sinusitis: Secondary | ICD-10-CM | POA: Diagnosis not present

## 2023-06-14 DIAGNOSIS — Z955 Presence of coronary angioplasty implant and graft: Secondary | ICD-10-CM | POA: Diagnosis not present

## 2023-06-14 DIAGNOSIS — J0100 Acute maxillary sinusitis, unspecified: Secondary | ICD-10-CM | POA: Diagnosis not present

## 2023-06-14 DIAGNOSIS — E785 Hyperlipidemia, unspecified: Secondary | ICD-10-CM | POA: Diagnosis not present

## 2023-06-14 DIAGNOSIS — Z794 Long term (current) use of insulin: Secondary | ICD-10-CM | POA: Diagnosis not present

## 2023-06-14 DIAGNOSIS — I10 Essential (primary) hypertension: Secondary | ICD-10-CM | POA: Diagnosis not present

## 2023-06-14 DIAGNOSIS — I2510 Atherosclerotic heart disease of native coronary artery without angina pectoris: Secondary | ICD-10-CM | POA: Diagnosis not present

## 2023-06-14 DIAGNOSIS — Z7982 Long term (current) use of aspirin: Secondary | ICD-10-CM | POA: Diagnosis not present

## 2023-06-14 DIAGNOSIS — Z940 Kidney transplant status: Secondary | ICD-10-CM | POA: Diagnosis not present

## 2023-06-14 DIAGNOSIS — Z79624 Long term (current) use of inhibitors of nucleotide synthesis: Secondary | ICD-10-CM | POA: Diagnosis not present

## 2023-06-14 DIAGNOSIS — I252 Old myocardial infarction: Secondary | ICD-10-CM | POA: Diagnosis not present

## 2023-06-14 DIAGNOSIS — B999 Unspecified infectious disease: Secondary | ICD-10-CM | POA: Diagnosis not present

## 2023-06-14 DIAGNOSIS — Z7952 Long term (current) use of systemic steroids: Secondary | ICD-10-CM | POA: Diagnosis not present

## 2023-06-14 DIAGNOSIS — D84821 Immunodeficiency due to drugs: Secondary | ICD-10-CM | POA: Diagnosis not present

## 2023-06-14 DIAGNOSIS — K029 Dental caries, unspecified: Secondary | ICD-10-CM | POA: Diagnosis not present

## 2023-06-15 DIAGNOSIS — R040 Epistaxis: Secondary | ICD-10-CM | POA: Diagnosis not present

## 2023-06-15 DIAGNOSIS — X58XXXA Exposure to other specified factors, initial encounter: Secondary | ICD-10-CM | POA: Diagnosis not present

## 2023-06-15 DIAGNOSIS — Z7952 Long term (current) use of systemic steroids: Secondary | ICD-10-CM | POA: Diagnosis not present

## 2023-06-15 DIAGNOSIS — E1165 Type 2 diabetes mellitus with hyperglycemia: Secondary | ICD-10-CM | POA: Diagnosis not present

## 2023-06-15 DIAGNOSIS — I951 Orthostatic hypotension: Secondary | ICD-10-CM | POA: Diagnosis not present

## 2023-06-15 DIAGNOSIS — E785 Hyperlipidemia, unspecified: Secondary | ICD-10-CM | POA: Diagnosis not present

## 2023-06-15 DIAGNOSIS — J0140 Acute pansinusitis, unspecified: Secondary | ICD-10-CM | POA: Diagnosis not present

## 2023-06-15 DIAGNOSIS — D638 Anemia in other chronic diseases classified elsewhere: Secondary | ICD-10-CM | POA: Diagnosis not present

## 2023-06-15 DIAGNOSIS — Z79624 Long term (current) use of inhibitors of nucleotide synthesis: Secondary | ICD-10-CM | POA: Diagnosis not present

## 2023-06-15 DIAGNOSIS — J0100 Acute maxillary sinusitis, unspecified: Secondary | ICD-10-CM | POA: Diagnosis not present

## 2023-06-15 DIAGNOSIS — Z79621 Long term (current) use of calcineurin inhibitor: Secondary | ICD-10-CM | POA: Diagnosis not present

## 2023-06-15 DIAGNOSIS — I10 Essential (primary) hypertension: Secondary | ICD-10-CM | POA: Diagnosis not present

## 2023-06-15 DIAGNOSIS — Z8674 Personal history of sudden cardiac arrest: Secondary | ICD-10-CM | POA: Diagnosis not present

## 2023-06-15 DIAGNOSIS — I252 Old myocardial infarction: Secondary | ICD-10-CM | POA: Diagnosis not present

## 2023-06-15 DIAGNOSIS — Z8546 Personal history of malignant neoplasm of prostate: Secondary | ICD-10-CM | POA: Diagnosis not present

## 2023-06-15 DIAGNOSIS — I959 Hypotension, unspecified: Secondary | ICD-10-CM | POA: Diagnosis not present

## 2023-06-15 DIAGNOSIS — D84821 Immunodeficiency due to drugs: Secondary | ICD-10-CM | POA: Diagnosis not present

## 2023-06-15 DIAGNOSIS — Z940 Kidney transplant status: Secondary | ICD-10-CM | POA: Diagnosis not present

## 2023-06-15 DIAGNOSIS — K029 Dental caries, unspecified: Secondary | ICD-10-CM | POA: Diagnosis not present

## 2023-06-15 DIAGNOSIS — J0101 Acute recurrent maxillary sinusitis: Secondary | ICD-10-CM | POA: Diagnosis not present

## 2023-06-15 DIAGNOSIS — N1832 Chronic kidney disease, stage 3b: Secondary | ICD-10-CM | POA: Diagnosis not present

## 2023-06-15 DIAGNOSIS — Z794 Long term (current) use of insulin: Secondary | ICD-10-CM | POA: Diagnosis not present

## 2023-06-15 DIAGNOSIS — S025XXA Fracture of tooth (traumatic), initial encounter for closed fracture: Secondary | ICD-10-CM | POA: Diagnosis not present

## 2023-06-15 DIAGNOSIS — B999 Unspecified infectious disease: Secondary | ICD-10-CM | POA: Diagnosis not present

## 2023-06-15 DIAGNOSIS — Z7982 Long term (current) use of aspirin: Secondary | ICD-10-CM | POA: Diagnosis not present

## 2023-06-15 DIAGNOSIS — J849 Interstitial pulmonary disease, unspecified: Secondary | ICD-10-CM | POA: Diagnosis not present

## 2023-06-15 DIAGNOSIS — Z955 Presence of coronary angioplasty implant and graft: Secondary | ICD-10-CM | POA: Diagnosis not present

## 2023-06-15 DIAGNOSIS — B9689 Other specified bacterial agents as the cause of diseases classified elsewhere: Secondary | ICD-10-CM | POA: Diagnosis not present

## 2023-06-15 DIAGNOSIS — I2510 Atherosclerotic heart disease of native coronary artery without angina pectoris: Secondary | ICD-10-CM | POA: Diagnosis not present

## 2023-06-16 DIAGNOSIS — I951 Orthostatic hypotension: Secondary | ICD-10-CM | POA: Diagnosis not present

## 2023-06-16 DIAGNOSIS — D84821 Immunodeficiency due to drugs: Secondary | ICD-10-CM | POA: Diagnosis not present

## 2023-06-16 DIAGNOSIS — J849 Interstitial pulmonary disease, unspecified: Secondary | ICD-10-CM | POA: Diagnosis not present

## 2023-06-16 DIAGNOSIS — Z794 Long term (current) use of insulin: Secondary | ICD-10-CM | POA: Diagnosis not present

## 2023-06-16 DIAGNOSIS — Z940 Kidney transplant status: Secondary | ICD-10-CM | POA: Diagnosis not present

## 2023-06-16 DIAGNOSIS — I10 Essential (primary) hypertension: Secondary | ICD-10-CM | POA: Diagnosis not present

## 2023-06-16 DIAGNOSIS — J0101 Acute recurrent maxillary sinusitis: Secondary | ICD-10-CM | POA: Diagnosis not present

## 2023-06-16 DIAGNOSIS — R040 Epistaxis: Secondary | ICD-10-CM | POA: Diagnosis not present

## 2023-06-16 DIAGNOSIS — Z7952 Long term (current) use of systemic steroids: Secondary | ICD-10-CM | POA: Diagnosis not present

## 2023-06-16 DIAGNOSIS — X58XXXA Exposure to other specified factors, initial encounter: Secondary | ICD-10-CM | POA: Diagnosis not present

## 2023-06-16 DIAGNOSIS — K029 Dental caries, unspecified: Secondary | ICD-10-CM | POA: Diagnosis not present

## 2023-06-16 DIAGNOSIS — B999 Unspecified infectious disease: Secondary | ICD-10-CM | POA: Diagnosis not present

## 2023-06-16 DIAGNOSIS — N1832 Chronic kidney disease, stage 3b: Secondary | ICD-10-CM | POA: Diagnosis not present

## 2023-06-16 DIAGNOSIS — N179 Acute kidney failure, unspecified: Secondary | ICD-10-CM | POA: Diagnosis not present

## 2023-06-16 DIAGNOSIS — Z5181 Encounter for therapeutic drug level monitoring: Secondary | ICD-10-CM | POA: Diagnosis not present

## 2023-06-16 DIAGNOSIS — E785 Hyperlipidemia, unspecified: Secondary | ICD-10-CM | POA: Diagnosis not present

## 2023-06-16 DIAGNOSIS — D638 Anemia in other chronic diseases classified elsewhere: Secondary | ICD-10-CM | POA: Diagnosis not present

## 2023-06-16 DIAGNOSIS — I252 Old myocardial infarction: Secondary | ICD-10-CM | POA: Diagnosis not present

## 2023-06-16 DIAGNOSIS — Z955 Presence of coronary angioplasty implant and graft: Secondary | ICD-10-CM | POA: Diagnosis not present

## 2023-06-16 DIAGNOSIS — Z8546 Personal history of malignant neoplasm of prostate: Secondary | ICD-10-CM | POA: Diagnosis not present

## 2023-06-16 DIAGNOSIS — Z8674 Personal history of sudden cardiac arrest: Secondary | ICD-10-CM | POA: Diagnosis not present

## 2023-06-16 DIAGNOSIS — Z79624 Long term (current) use of inhibitors of nucleotide synthesis: Secondary | ICD-10-CM | POA: Diagnosis not present

## 2023-06-16 DIAGNOSIS — J0100 Acute maxillary sinusitis, unspecified: Secondary | ICD-10-CM | POA: Diagnosis not present

## 2023-06-16 DIAGNOSIS — E1165 Type 2 diabetes mellitus with hyperglycemia: Secondary | ICD-10-CM | POA: Diagnosis not present

## 2023-06-16 DIAGNOSIS — Z79621 Long term (current) use of calcineurin inhibitor: Secondary | ICD-10-CM | POA: Diagnosis not present

## 2023-06-16 DIAGNOSIS — S025XXA Fracture of tooth (traumatic), initial encounter for closed fracture: Secondary | ICD-10-CM | POA: Diagnosis not present

## 2023-06-16 DIAGNOSIS — I2510 Atherosclerotic heart disease of native coronary artery without angina pectoris: Secondary | ICD-10-CM | POA: Diagnosis not present

## 2023-06-16 DIAGNOSIS — B9689 Other specified bacterial agents as the cause of diseases classified elsewhere: Secondary | ICD-10-CM | POA: Diagnosis not present

## 2023-06-16 DIAGNOSIS — I959 Hypotension, unspecified: Secondary | ICD-10-CM | POA: Diagnosis not present

## 2023-06-16 DIAGNOSIS — Z7982 Long term (current) use of aspirin: Secondary | ICD-10-CM | POA: Diagnosis not present

## 2023-06-21 DIAGNOSIS — I2510 Atherosclerotic heart disease of native coronary artery without angina pectoris: Secondary | ICD-10-CM | POA: Diagnosis not present

## 2023-06-21 DIAGNOSIS — Z7982 Long term (current) use of aspirin: Secondary | ICD-10-CM | POA: Diagnosis not present

## 2023-06-21 DIAGNOSIS — Z01818 Encounter for other preprocedural examination: Secondary | ICD-10-CM | POA: Diagnosis not present

## 2023-06-21 DIAGNOSIS — K089 Disorder of teeth and supporting structures, unspecified: Secondary | ICD-10-CM | POA: Diagnosis not present

## 2023-06-21 DIAGNOSIS — J329 Chronic sinusitis, unspecified: Secondary | ICD-10-CM | POA: Diagnosis not present

## 2023-06-21 DIAGNOSIS — I129 Hypertensive chronic kidney disease with stage 1 through stage 4 chronic kidney disease, or unspecified chronic kidney disease: Secondary | ICD-10-CM | POA: Diagnosis not present

## 2023-06-21 DIAGNOSIS — E1122 Type 2 diabetes mellitus with diabetic chronic kidney disease: Secondary | ICD-10-CM | POA: Diagnosis not present

## 2023-06-22 DIAGNOSIS — Z955 Presence of coronary angioplasty implant and graft: Secondary | ICD-10-CM | POA: Diagnosis not present

## 2023-06-22 DIAGNOSIS — E785 Hyperlipidemia, unspecified: Secondary | ICD-10-CM | POA: Diagnosis not present

## 2023-06-22 DIAGNOSIS — B9689 Other specified bacterial agents as the cause of diseases classified elsewhere: Secondary | ICD-10-CM | POA: Diagnosis not present

## 2023-06-22 DIAGNOSIS — J324 Chronic pansinusitis: Secondary | ICD-10-CM | POA: Diagnosis not present

## 2023-06-22 DIAGNOSIS — B9562 Methicillin resistant Staphylococcus aureus infection as the cause of diseases classified elsewhere: Secondary | ICD-10-CM | POA: Diagnosis not present

## 2023-06-22 DIAGNOSIS — I252 Old myocardial infarction: Secondary | ICD-10-CM | POA: Diagnosis not present

## 2023-06-22 DIAGNOSIS — Z794 Long term (current) use of insulin: Secondary | ICD-10-CM | POA: Diagnosis not present

## 2023-06-22 DIAGNOSIS — K219 Gastro-esophageal reflux disease without esophagitis: Secondary | ICD-10-CM | POA: Diagnosis not present

## 2023-06-22 DIAGNOSIS — J321 Chronic frontal sinusitis: Secondary | ICD-10-CM | POA: Diagnosis not present

## 2023-06-22 DIAGNOSIS — Z881 Allergy status to other antibiotic agents status: Secondary | ICD-10-CM | POA: Diagnosis not present

## 2023-06-22 DIAGNOSIS — Z8546 Personal history of malignant neoplasm of prostate: Secondary | ICD-10-CM | POA: Diagnosis not present

## 2023-06-22 DIAGNOSIS — J323 Chronic sphenoidal sinusitis: Secondary | ICD-10-CM | POA: Diagnosis not present

## 2023-06-22 DIAGNOSIS — J320 Chronic maxillary sinusitis: Secondary | ICD-10-CM | POA: Diagnosis not present

## 2023-06-22 DIAGNOSIS — I120 Hypertensive chronic kidney disease with stage 5 chronic kidney disease or end stage renal disease: Secondary | ICD-10-CM | POA: Diagnosis not present

## 2023-06-22 DIAGNOSIS — I25118 Atherosclerotic heart disease of native coronary artery with other forms of angina pectoris: Secondary | ICD-10-CM | POA: Diagnosis not present

## 2023-06-22 DIAGNOSIS — Z8249 Family history of ischemic heart disease and other diseases of the circulatory system: Secondary | ICD-10-CM | POA: Diagnosis not present

## 2023-06-22 DIAGNOSIS — J018 Other acute sinusitis: Secondary | ICD-10-CM | POA: Diagnosis not present

## 2023-06-22 DIAGNOSIS — J329 Chronic sinusitis, unspecified: Secondary | ICD-10-CM | POA: Diagnosis not present

## 2023-06-22 DIAGNOSIS — Z7985 Long-term (current) use of injectable non-insulin antidiabetic drugs: Secondary | ICD-10-CM | POA: Diagnosis not present

## 2023-06-22 DIAGNOSIS — J322 Chronic ethmoidal sinusitis: Secondary | ICD-10-CM | POA: Diagnosis not present

## 2023-06-22 DIAGNOSIS — Z5189 Encounter for other specified aftercare: Secondary | ICD-10-CM | POA: Diagnosis not present

## 2023-06-22 DIAGNOSIS — N186 End stage renal disease: Secondary | ICD-10-CM | POA: Diagnosis not present

## 2023-06-22 DIAGNOSIS — E11319 Type 2 diabetes mellitus with unspecified diabetic retinopathy without macular edema: Secondary | ICD-10-CM | POA: Diagnosis not present

## 2023-06-22 DIAGNOSIS — E1122 Type 2 diabetes mellitus with diabetic chronic kidney disease: Secondary | ICD-10-CM | POA: Diagnosis not present

## 2023-06-22 DIAGNOSIS — K029 Dental caries, unspecified: Secondary | ICD-10-CM | POA: Diagnosis not present

## 2023-06-22 DIAGNOSIS — K047 Periapical abscess without sinus: Secondary | ICD-10-CM | POA: Diagnosis not present

## 2023-06-28 DIAGNOSIS — Z940 Kidney transplant status: Secondary | ICD-10-CM | POA: Diagnosis not present

## 2023-06-28 DIAGNOSIS — Z794 Long term (current) use of insulin: Secondary | ICD-10-CM | POA: Diagnosis not present

## 2023-06-28 DIAGNOSIS — I10 Essential (primary) hypertension: Secondary | ICD-10-CM | POA: Diagnosis not present

## 2023-06-28 DIAGNOSIS — E1165 Type 2 diabetes mellitus with hyperglycemia: Secondary | ICD-10-CM | POA: Diagnosis not present

## 2023-06-28 DIAGNOSIS — J329 Chronic sinusitis, unspecified: Secondary | ICD-10-CM | POA: Diagnosis not present

## 2023-06-28 DIAGNOSIS — I119 Hypertensive heart disease without heart failure: Secondary | ICD-10-CM | POA: Diagnosis not present

## 2023-06-28 DIAGNOSIS — I252 Old myocardial infarction: Secondary | ICD-10-CM | POA: Diagnosis not present

## 2023-06-28 DIAGNOSIS — B9689 Other specified bacterial agents as the cause of diseases classified elsewhere: Secondary | ICD-10-CM | POA: Diagnosis not present

## 2023-06-28 DIAGNOSIS — B348 Other viral infections of unspecified site: Secondary | ICD-10-CM | POA: Diagnosis not present

## 2023-06-28 DIAGNOSIS — I25118 Atherosclerotic heart disease of native coronary artery with other forms of angina pectoris: Secondary | ICD-10-CM | POA: Diagnosis not present

## 2023-07-04 DIAGNOSIS — R748 Abnormal levels of other serum enzymes: Secondary | ICD-10-CM | POA: Diagnosis not present

## 2023-07-04 DIAGNOSIS — K760 Fatty (change of) liver, not elsewhere classified: Secondary | ICD-10-CM | POA: Diagnosis not present

## 2023-07-04 DIAGNOSIS — R7989 Other specified abnormal findings of blood chemistry: Secondary | ICD-10-CM | POA: Diagnosis not present

## 2023-07-10 DIAGNOSIS — H2511 Age-related nuclear cataract, right eye: Secondary | ICD-10-CM | POA: Diagnosis not present

## 2023-07-10 DIAGNOSIS — H211X2 Other vascular disorders of iris and ciliary body, left eye: Secondary | ICD-10-CM | POA: Diagnosis not present

## 2023-07-10 DIAGNOSIS — H33052 Total retinal detachment, left eye: Secondary | ICD-10-CM | POA: Diagnosis not present

## 2023-07-10 DIAGNOSIS — E113411 Type 2 diabetes mellitus with severe nonproliferative diabetic retinopathy with macular edema, right eye: Secondary | ICD-10-CM | POA: Diagnosis not present

## 2023-08-15 ENCOUNTER — Ambulatory Visit: Payer: Medicare Other | Admitting: Internal Medicine

## 2023-08-15 ENCOUNTER — Encounter: Payer: Self-pay | Admitting: Internal Medicine

## 2023-08-15 ENCOUNTER — Other Ambulatory Visit: Payer: Self-pay

## 2023-08-15 VITALS — BP 128/84 | HR 61 | Temp 98.6°F | Resp 16 | Ht 70.0 in | Wt 172.6 lb

## 2023-08-15 DIAGNOSIS — E7800 Pure hypercholesterolemia, unspecified: Secondary | ICD-10-CM

## 2023-08-15 DIAGNOSIS — I2510 Atherosclerotic heart disease of native coronary artery without angina pectoris: Secondary | ICD-10-CM | POA: Diagnosis not present

## 2023-08-15 DIAGNOSIS — E1165 Type 2 diabetes mellitus with hyperglycemia: Secondary | ICD-10-CM | POA: Diagnosis not present

## 2023-08-15 DIAGNOSIS — I10 Essential (primary) hypertension: Secondary | ICD-10-CM | POA: Diagnosis not present

## 2023-08-15 MED ORDER — DEXCOM G7 RECEIVER DEVI: 2 refills | Status: AC

## 2023-08-15 MED ORDER — DEXCOM G7 SENSOR MISC: 5 refills | Status: AC

## 2023-08-15 NOTE — Progress Notes (Signed)
 Office Visit  Subjective   Patient ID: Jason Zimmerman   DOB: Mar 11, 1956   Age: 68 y.o.   MRN: [id number]   Chief Complaint Chief Complaint  Patient presents with   Follow-up     History of Present Illness The patient is a 68 year old Caucasian/White male who returns for a follow-up visit for his T2 diabetes.  He was diagnosed with diabetes in 2004.  Over the interim, he did see transplant team and they said he was losing too much weight.  The patient then stopped ozempic on his own without any physician telling him to stop.  Over the interim, he did contract COVID-19 and had a short hospital stay.  He has not recently seen endocrinology but contacted them and they started the [name].   He has had noncompliance in the past with not taking his SSI meal time coverage.  He states the transplant team has adjusted his insulin over the interim.  He is currently on:  lantus 25 Units BID and lyumjev sliding scale which he takes 2 times per day.   He is not walking as much as they would like.  He specifically denies unexplained abdominal pain, nausea or vomiting, or documented hypoglycemia. He uses the [employer] CGM and his sugars range somewhere in AM 90-150 and at night 90-200.  His last HgbA1c was done on 06/13/2023 by transplant and it was 10.7%.   He does have long term complications of CAD/PVD, diabetic retinopathy and diabetic nephropathy but no diabetic neuropathy.  His last dilated eye exam by Dr. Luciana Axe in [place] was done on 11/24/2022 where he has severe proliferative retinopathy and macular edema.  They have been doing injections in his eyes.  The patient is a 68 year old Caucasian/White male who presents for a follow-up evaluation of hypertension.  Since his last visit, he has not had any problems.  This past year, we have noted some elevated blood pressures.  The patient has been checking his blood pressure at home.  The patient states his systolic BP runs in the 120-130's.   The patient's current medications include: coreg 3.125mg  po BID. The patient has been tolerating his medications well. The patient denies any headache, visual changes, dizziness, lightheadness, chest pain, shortness of breath, weakness/numbness, and edema. He reports there have been no other symptoms noted.   Jason Zimmerman returns today for routine followup on his cholesterol.  He was on atorvastatin but was taken off this medication a few weeks ago from what her thinks is due to elevated liver enzymes.  Overall, he states he is doing well and is without any complaints or problems at this time. He specifically denies chest pain, abdominal pain, nausea, diarrhea, and myalgias. He remains on dietary management as well as the following cholesterol lowering medications zetia 10mg  daily. He is fasting in anticipation for labs today.    This patient also has moderately severe CAD where he sustained a STEMI in 10/2010. He was taken to [facility] where he tells me he had a 100% occlusion of his left main. They ended up doing angioplasty and stent placement.  Unfortunately, his course was complicated in the Cath lab by cardiac arrest where he was successfully resusciated.   The patient is currently followed by Dr. Jeralene Huff in Cardiology with last visit in 12/2022 where they felt his CAD was stable.  See PMH for summary of cardiac history and status of revascularization. His CAD is controlled with therapy  as summarized in the medication list and previous notes. The patient has no comorbid conditions. He has no baseline symptoms of CAD. He has the following modifiable risk factor(s): HTN, DM, and hyperlipidemia. Specifically denied complaint(s): chest pain, palpitations, orthopnea, edema, exertional dyspnea, and syncope.        Past Medical History Past Medical History:  Diagnosis Date   Anemia associated with chronic renal failure    CKD (chronic kidney disease), stage V (HCC)    Coronary disease     Diabetic nephropathy associated with type 2 diabetes mellitus (HCC)    Diabetic retinopathy (HCC)    Fatty liver    per patient's wife   GERD (gastroesophageal reflux disease) 03/14/2022   no meds   Heart attack (HCC) 10/19/2010   History of prostate cancer    Hyperlipidemia    Hypertension, renal    Ocular hypertension    Prostate cancer (HCC) 2011   RTA (renal tubular acidosis)    Secondary hyperparathyroidism, renal (HCC)    Vitamin D deficiency      Allergies Allergies  Allergen Reactions   Cefazolin Anaphylaxis    Administered for surgical prophylaxis along with platelets and pt subsequently had presumed allergic reaction with hypotension, tachycardia elevate peak pressures     Medications  Current Outpatient Medications:    acetaminophen (TYLENOL) 500 MG tablet, Take 1,000 mg by mouth every 6 (six) hours as needed (pain.)., Disp: , Rfl:    aspirin EC 81 MG tablet, Take 81 mg by mouth at bedtime., Disp: , Rfl:    carvedilol (COREG) 3.125 MG tablet, Take 3.125 mg by mouth 2 (two) times daily with a meal., Disp: , Rfl:    ezetimibe (ZETIA) 10 MG tablet, Take 10 mg by mouth in the morning., Disp: , Rfl:    glucose blood (FREESTYLE TEST STRIPS) test strip, 1 each by Other route 2 (two) times daily as needed for other. PRN, Disp: 102 each, Rfl: 5   insulin glargine (LANTUS) 100 UNIT/ML Solostar Pen, Inject 48 Units into the skin at bedtime., Disp: , Rfl:    LYUMJEV KWIKPEN 100 UNIT/ML KwikPen, Inject into the skin., Disp: , Rfl:    mycophenolate (MYFORTIC) 180 MG EC tablet, Take 180 mg by mouth 2 (two) times daily., Disp: , Rfl:    nitroGLYCERIN (NITROSTAT) 0.4 MG SL tablet, Place 0.4 mg under the tongue every 5 (five) minutes x 3 doses as needed for chest pain., Disp: , Rfl:    omeprazole (PRILOSEC) 20 MG capsule, Take 20 mg by mouth daily., Disp: , Rfl:    oxyCODONE (OXY IR/ROXICODONE) 5 MG immediate release tablet, Take 5 mg by mouth every 6 (six) hours as needed., Disp: ,  Rfl:    predniSONE (DELTASONE) 5 MG tablet, Take 5 mg by mouth daily with breakfast., Disp: , Rfl:    predniSONE (DELTASONE) 5 MG tablet, Take 1 tablet by mouth daily., Disp: , Rfl:    SENNA S 8.6-50 MG tablet, Take 2 tablets by mouth as needed., Disp: , Rfl:    tacrolimus (PROGRAF) 0.5 MG capsule, Take 0.5 mg by mouth daily. Total a day is 1.5mg  (takes 1mg  BID and 0.5mg  once a day), Disp: , Rfl:    tacrolimus (PROGRAF) 1 MG capsule, Take 1.5 mg by mouth 2 (two) times daily., Disp: , Rfl:    timolol (TIMOPTIC) 0.5 % ophthalmic solution, Place 1 drop into the left eye 2 (two) times daily., Disp: 10 mL, Rfl: 2   Review of Systems Review of  Systems  Constitutional:  Negative for chills, fever and malaise/fatigue.  Eyes:  Negative for blurred vision and double vision.  Respiratory:  Negative for cough and shortness of breath.   Cardiovascular:  Negative for chest pain, palpitations and leg swelling.  Gastrointestinal:  Negative for abdominal pain, constipation, diarrhea, nausea and vomiting.  Genitourinary:  Negative for frequency.  Musculoskeletal:  Negative for myalgias.  Skin:  Negative for itching and rash.  Neurological:  Negative for dizziness, weakness and headaches.  Endo/Heme/Allergies:  Negative for polydipsia.       Objective:    Vitals BP 128/84   Pulse 61   Temp 98.6 F (37 C)   Resp 16   Ht 5\' 10"  (1.778 m)   Wt 172 lb 9.6 oz (78.3 kg)   SpO2 96%   BMI 24.77 kg/m    Physical Examination Physical Exam Constitutional:      Appearance: Normal appearance. He is not ill-appearing.  Cardiovascular:     Rate and Rhythm: Normal rate and regular rhythm.     Pulses: Normal pulses.     Heart sounds: No murmur heard.    No friction rub. No gallop.  Pulmonary:     Effort: Pulmonary effort is normal. No respiratory distress.     Breath sounds: No wheezing, rhonchi or rales.  Abdominal:     General: Abdomen is flat. Bowel sounds are normal. There is no distension.      Palpations: Abdomen is soft.     Tenderness: There is no abdominal tenderness.  Musculoskeletal:     Right lower leg: No edema.     Left lower leg: No edema.  Skin:    General: Skin is warm and dry.     Findings: No rash.  Neurological:     General: No focal deficit present.     Mental Status: He is alert and oriented to person, place, and time.  Psychiatric:        Mood and Affect: Mood normal.        Behavior: Behavior normal.        Assessment & Plan:   Essential hypertension His BP is doing well.  We will continue on coreg.  CAD (coronary artery disease) He denies any angina at this time.  He goes back to see cardiology in 01/2024.  We will continue risk factor modification.  They did stop him off his statin due to elevated liver enzymes.  He recently saw GI for this and an elevated ALP.  Inadequately controlled diabetes mellitus (HCC) I want him to increase his lantus from 25 Units BID to 27 Units BID.  He remains on prednisone which is not helping his sugars but he needs this for his kidney transplant.  He will followup with endo next month. I want him to eat healthy and exercise more if he can.  Hypercholesterolemia Transplant did do a FLP in 05/2023 and his LDL was 97.  His LDL needs to be <55 with his history of CAD and DM but he cannot take a statin with his recent liver enzymes.  He can have a discussion with GI whether they think he can go on a statin.      Return in about 3 months (around 11/14/2023) for annual.   Crist Fat, MD

## 2023-08-15 NOTE — Assessment & Plan Note (Signed)
 Transplant did do a FLP in 05/2023 and his LDL was 97.  His LDL needs to be <55 with his history of CAD and DM but he cannot take a statin with his recent liver enzymes.  He can have a discussion with GI whether they think he can go on a statin.

## 2023-08-15 NOTE — Progress Notes (Signed)
 New Rx

## 2023-08-15 NOTE — Assessment & Plan Note (Signed)
 He denies any angina at this time.  He goes back to see cardiology in 01/2024.  We will continue risk factor modification.  They did stop him off his statin due to elevated liver enzymes.  He recently saw GI for this and an elevated ALP.

## 2023-08-15 NOTE — Assessment & Plan Note (Signed)
 His BP is doing well.  We will continue on coreg.

## 2023-08-15 NOTE — Assessment & Plan Note (Signed)
 I want him to increase his lantus from 25 Units BID to 27 Units BID.  He remains on prednisone which is not helping his sugars but he needs this for his kidney transplant.  He will followup with endo next month. I want him to eat healthy and exercise more if he can.

## 2023-08-16 ENCOUNTER — Other Ambulatory Visit: Payer: Self-pay

## 2023-08-16 DIAGNOSIS — Z940 Kidney transplant status: Secondary | ICD-10-CM

## 2023-08-16 MED ORDER — PREDNISONE 10 MG PO TABS: 10.0000 mg | ORAL_TABLET | Freq: Every day | ORAL | 3 refills | Status: AC

## 2023-08-16 NOTE — Progress Notes (Signed)
 Rx refill

## 2023-08-30 DIAGNOSIS — H2702 Aphakia, left eye: Secondary | ICD-10-CM | POA: Diagnosis not present

## 2023-08-30 DIAGNOSIS — D631 Anemia in chronic kidney disease: Secondary | ICD-10-CM | POA: Diagnosis not present

## 2023-08-30 DIAGNOSIS — H33052 Total retinal detachment, left eye: Secondary | ICD-10-CM | POA: Diagnosis not present

## 2023-08-30 DIAGNOSIS — E113411 Type 2 diabetes mellitus with severe nonproliferative diabetic retinopathy with macular edema, right eye: Secondary | ICD-10-CM | POA: Diagnosis not present

## 2023-08-30 DIAGNOSIS — Z794 Long term (current) use of insulin: Secondary | ICD-10-CM | POA: Diagnosis not present

## 2023-08-30 DIAGNOSIS — N189 Chronic kidney disease, unspecified: Secondary | ICD-10-CM | POA: Diagnosis not present

## 2023-08-30 DIAGNOSIS — I129 Hypertensive chronic kidney disease with stage 1 through stage 4 chronic kidney disease, or unspecified chronic kidney disease: Secondary | ICD-10-CM | POA: Diagnosis not present

## 2023-08-30 DIAGNOSIS — E1122 Type 2 diabetes mellitus with diabetic chronic kidney disease: Secondary | ICD-10-CM | POA: Diagnosis not present

## 2023-08-30 DIAGNOSIS — H2511 Age-related nuclear cataract, right eye: Secondary | ICD-10-CM | POA: Diagnosis not present

## 2023-09-13 DIAGNOSIS — E113411 Type 2 diabetes mellitus with severe nonproliferative diabetic retinopathy with macular edema, right eye: Secondary | ICD-10-CM | POA: Diagnosis not present

## 2023-09-20 DIAGNOSIS — S72001A Fracture of unspecified part of neck of right femur, initial encounter for closed fracture: Secondary | ICD-10-CM | POA: Diagnosis not present

## 2023-10-12 ENCOUNTER — Ambulatory Visit: Admitting: Internal Medicine

## 2023-10-12 ENCOUNTER — Encounter: Payer: Self-pay | Admitting: Internal Medicine

## 2023-10-12 VITALS — BP 118/70 | HR 97 | Temp 101.1°F | Resp 20 | Ht 70.0 in | Wt 176.1 lb

## 2023-10-12 DIAGNOSIS — K529 Noninfective gastroenteritis and colitis, unspecified: Secondary | ICD-10-CM | POA: Diagnosis not present

## 2023-10-12 MED ORDER — ONDANSETRON HCL 4 MG PO TABS: 4.0000 mg | ORAL_TABLET | Freq: Three times a day (TID) | ORAL | 0 refills | Status: DC | PRN

## 2023-10-12 NOTE — Progress Notes (Signed)
 Office Visit  Subjective   Patient ID: Jason Zimmerman   DOB: 07-Oct-1955   Age: 68 y.o.   MRN: [id number]   Chief Complaint Chief Complaint  Patient presents with   Persistant cough     History of Present Illness Jason Zimmerman is a 68 yo male  who comes in today for an acute visit for respiratory illness.  The patient states 2 weeks ago he started having some chest congestion with cough productive of yellow sputum.  He was not having any other symptoms at that time but he states this did get a lot better.  However, yesterday he states he developed nausea with vomiting where he vomited 3 times over night.  He woke early this morning with diarrhea which he had 2 watery BM's but there is no pus or blood.  Today, he still has some cough but it is nonproductive.  He denies any chest congestion, sinus congestion, nasal discharge, post nasal drip, sore throat, headache, myaglias, SOB, wheezing, or other problems.  He has not taken anything OTC for his symptoms.       Past Medical History Past Medical History:  Diagnosis Date   Anemia associated with chronic renal failure    CKD (chronic kidney disease), stage V (HCC)    Coronary disease    Diabetic nephropathy associated with type 2 diabetes mellitus (HCC)    Diabetic retinopathy (HCC)    Fatty liver    per patient's wife   GERD (gastroesophageal reflux disease) 03/14/2022   no meds   Heart attack (HCC) 10/19/2010   History of prostate cancer    Hyperlipidemia    Hypertension, renal    Ocular hypertension    Prostate cancer (HCC) 2011   RTA (renal tubular acidosis)    Secondary hyperparathyroidism, renal (HCC)    Vitamin D deficiency      Allergies Allergies  Allergen Reactions   Cefazolin  Anaphylaxis    Administered for surgical prophylaxis along with platelets and pt subsequently had presumed allergic reaction with hypotension, tachycardia elevate peak pressures     Medications  Current Outpatient Medications:     acetaminophen  (TYLENOL ) 500 MG tablet, Take 1,000 mg by mouth every 6 (six) hours as needed (pain.)., Disp: , Rfl:    aspirin EC 81 MG tablet, Take 81 mg by mouth at bedtime., Disp: , Rfl:    carvedilol (COREG) 3.125 MG tablet, Take 3.125 mg by mouth 2 (two) times daily with a meal., Disp: , Rfl:    Continuous Glucose Receiver (DEXCOM G7 RECEIVER) DEVI, Continuous Glucose Monitor Receiver, Disp: 3 each, Rfl: 2   Continuous Glucose Sensor (DEXCOM G7 SENSOR) MISC, Change  sensor every 10 days, Disp: 3 each, Rfl: 5   ezetimibe (ZETIA) 10 MG tablet, Take 10 mg by mouth in the morning., Disp: , Rfl:    glucose blood (FREESTYLE TEST STRIPS) test strip, 1 each by Other route 2 (two) times daily as needed for other. PRN, Disp: 102 each, Rfl: 5   insulin  glargine (LANTUS) 100 UNIT/ML Solostar Pen, Inject 48 Units into the skin at bedtime., Disp: , Rfl:    LYUMJEV KWIKPEN 100 UNIT/ML KwikPen, Inject into the skin., Disp: , Rfl:    mycophenolate (MYFORTIC) 180 MG EC tablet, Take 180 mg by mouth 2 (two) times daily., Disp: , Rfl:    nitroGLYCERIN (NITROSTAT) 0.4 MG SL tablet, Place 0.4 mg under the tongue every 5 (five) minutes x 3 doses as needed for chest pain., Disp: , Rfl:  omeprazole (PRILOSEC) 20 MG capsule, Take 20 mg by mouth daily., Disp: , Rfl:    oxyCODONE  (OXY IR/ROXICODONE ) 5 MG immediate release tablet, Take 5 mg by mouth every 6 (six) hours as needed., Disp: , Rfl:    predniSONE  (DELTASONE ) 10 MG tablet, Take 1 tablet (10 mg total) by mouth daily with breakfast., Disp: 90 tablet, Rfl: 3   SENNA S 8.6-50 MG tablet, Take 2 tablets by mouth as needed., Disp: , Rfl:    tacrolimus  (PROGRAF ) 1 MG capsule, Take 1.5 mg by mouth 2 (two) times daily., Disp: , Rfl:    timolol  (TIMOPTIC ) 0.5 % ophthalmic solution, Place 1 drop into the left eye 2 (two) times daily., Disp: 10 mL, Rfl: 2   Review of Systems Review of Systems  Constitutional:  Positive for fever. Negative for chills.  Respiratory:   Positive for cough. Negative for sputum production, shortness of breath and wheezing.   Gastrointestinal:  Positive for diarrhea, nausea and vomiting. Negative for abdominal pain and constipation.  Genitourinary:  Negative for dysuria and frequency.  Skin:  Negative for itching and rash.  Neurological:  Negative for dizziness, weakness and headaches.       Objective:    Vitals BP 118/70 (BP Location: Right Arm, Patient Position: Sitting, Cuff Size: Normal)   Pulse 97   Temp (!) 101.1 F (38.4 C)   Resp 20   Ht 5' 10 (1.778 m)   Wt 176 lb 2 oz (79.9 kg)   BMI 25.27 kg/m    Physical Examination Physical Exam Constitutional:      Appearance: Normal appearance. He is not ill-appearing.   Cardiovascular:     Rate and Rhythm: Normal rate and regular rhythm.     Pulses: Normal pulses.     Heart sounds: No murmur heard.    No friction rub. No gallop.  Pulmonary:     Effort: Pulmonary effort is normal. No respiratory distress.     Breath sounds: No wheezing, rhonchi or rales.  Abdominal:     General: Abdomen is flat. Bowel sounds are normal. There is no distension.     Palpations: Abdomen is soft.     Tenderness: There is no abdominal tenderness.   Musculoskeletal:     Right lower leg: No edema.     Left lower leg: No edema.   Skin:    General: Skin is warm and dry.     Findings: No rash.   Neurological:     Mental Status: He is alert.        Assessment & Plan:   Gastroenteritis He has a low grade fever and some self limited n/v/d.  He can take tylenol  as needed and we will give him zofran  as needed for n/v.  He can also take immodium.  Warnings about worsening and going to the ER was discussed.  He is not dehydrated and he is drinking fluids well.  Continue supportive care.    No follow-ups on file.   Wayne Haines, MD

## 2023-10-12 NOTE — Assessment & Plan Note (Addendum)
 He has a low grade fever and some self limited n/v/d.  He can take tylenol  as needed and we will give him zofran  as needed for n/v.  He can also take immodium.  Warnings about worsening and going to the ER was discussed.  He is not dehydrated and he is drinking fluids well.  Continue supportive care.  His COVID and flu test was negative.

## 2023-10-26 DIAGNOSIS — E1165 Type 2 diabetes mellitus with hyperglycemia: Secondary | ICD-10-CM | POA: Diagnosis not present

## 2023-10-26 DIAGNOSIS — E1122 Type 2 diabetes mellitus with diabetic chronic kidney disease: Secondary | ICD-10-CM | POA: Diagnosis not present

## 2023-10-26 DIAGNOSIS — N184 Chronic kidney disease, stage 4 (severe): Secondary | ICD-10-CM | POA: Diagnosis not present

## 2023-10-26 DIAGNOSIS — Z794 Long term (current) use of insulin: Secondary | ICD-10-CM | POA: Diagnosis not present

## 2023-10-26 NOTE — Progress Notes (Signed)
 This Document was created using the aid of voice recognition Dragon dictation software.  HPI: Diabetes type 2: Patient's diabetes is significantly uncontrolled.  Patient did not follow-up with me since last 8 to 10 months.  Patient reports that he has ran out of Ozempic approximately 6 to 8 weeks ago.  Patient also has ran out of Lantus insulin  since last 2 to 3 weeks.  All of this is responsible for significant worsening of blood sugar control.  Patient reports that when he was taking the medications and the insulin  appropriately then his blood sugars were significantly better.  Patient is currently on Lyumjev insulin . Patient is using Freestyle libre CGM. I have reviewed and interpreted the Freestyle libre CGM. The report is scanned in media section of the chart.   Patient's blood sugars are ranging from 100-400 range.  Average blood sugars are 326.  Only 9% of the readings are within target range.  Most of the readings are elevated.  No hypoglycemia episodes are noted.  It appears that patient has significant postprandial hyperglycemia.  Hyperlipidemia: The patient's cholesterol is well-controlled.  Hypertension: Patient's blood pressure is well controlled.   The following portions of the patient's history were reviewed from the chart and updated as appropriate: allergies, current medications, past medical history, past social history and problem list.   Current Medications[1]  Allergies[2]  Patient Active Problem List   Diagnosis Date Noted   . Anemia 06/21/2023  . Chronic pansinusitis 06/17/2023  . Acute recurrent maxillary sinusitis 06/15/2023  . Caries involving multiple surfaces of tooth 06/15/2023  . Infection 06/13/2023  . Orthostatic hypotension 04/26/2022  . Hypomagnesemia 04/22/2022  . Hyperglycemia due to diabetes mellitus (HCC) 04/18/2022  . Immunosuppression (HCC) 04/22/2022  . Deceased-donor kidney transplant recipient (CMD) 04/22/22  . Essential hypertension 10/21/2019   . History of prostate cancer 10/21/2019  . H/O prostatectomy 10/21/2019  . Diabetic retinopathy (HCC) 10/21/2019  . Coronary artery disease of native artery of native heart with stable angina pectoris (HCC) 03/30/2017  . Old MI (myocardial infarction) 03/30/2017  . Dyslipidemia 03/30/2017  . Type 2 diabetes mellitus with stage 4 chronic kidney disease, with long-term current use of insulin  (HCC) 06/17/2015  . Mixed hyperlipidemia 06/17/2015    Resolved Problems   Diagnosis Date Noted Date Resolved  . COVID 06/15/2023 06/16/2023    ROS:  Appropriate ROS was done and it was negative except for as mentioned in HPI.   VITAL SIGNS:  Vitals:   10/26/23 1325  BP: 116/67  Pulse: 73      LABS/RADIOLOGY/MEDICAL RECORDS:  Hemoglobin A1c is 13.2 today which is significantly uncontrolled.  Cholesterol is well-controlled.  Patient's renal function is in stage III renal insufficiency. Recent Results (from the past 6 weeks)  POC Glucose (Hemocue/NOVA)   Collection Time: 10/26/23  1:47 PM  Result Value Ref Range   Glucose, POC 275 (A) 70 - 125 mg/dL   Kit/Device Lot # [id number]    Kit/Device Expiration Date 12/25/24   POC Hemoglobin A1c   Collection Time: 10/26/23  1:47 PM  Result Value Ref Range   Hemoglobin A1c (POC) 13.2 (A) 4.2 - 5.6 %   A1C Information      A1C Diagnostic Criteria: Prediabetes:5.7% - 6.4% Diabetes:>=6.5% A1C Glycemic Goals: Older Adults: <7.0% Children and Adolescents: <7.0% Pregnant Women: <6.0%   Kit/Device Lot # [id number]    Kit/Device Expiration Date 07/10/25     ORDERS: @ORDERDX @ Orders Placed This Encounter  Procedures  . POC Glucose (Hemocue/NOVA)  .  POC Hemoglobin A1c     ASSESSMENT/PLAN: 1. Type 2 diabetes mellitus with stage 4 chronic kidney disease, with long-term current use of insulin  (HCC) (Primary) I had a detailed discussion about further management of diabetes with the patient.  I have refilled Ozempic, Lantus and Lyumjev insulin .  I have  advised the patient to start taking the Ozempic at 0.5 mg once a week and increase the dose of Lantus insulin  to 30 units twice daily.  Hopefully this will improve the blood sugar control.  I am expecting diabetes control to improve once the patient is more compliant in taking the medication as an insulin  regularly.  Continue with freestyle libre CGM.  Refill prescription. - POC Glucose (Hemocue/NOVA) - POC Hemoglobin A1c - semaglutide (Ozempic) 0.25 mg or 0.5 mg (2 mg/3 mL) subcutaneous pen injector; Inject 0.5 mg under the skin once a week.  Dispense: 9 mL; Refill: 1 - insulin  lispro-aabc (Lyumjev KwikPen U-100 Insulin ) 100 unit/mL pen; Use as directed. Max daily dose is 100 units.  Dispense: 90 mL; Refill: 1  2. Mixed hyperlipidemia Continue the patient on current cholesterol regimen.   3. Essential hypertension Continue the patient on current blood pressure regimen.   4. Type 2 diabetes mellitus with hyperglycemia, with long-term current use of insulin     (CMD) Refilled insulin .  - insulin  glargine (Lantus Solostar U-100 Insulin ) 100 unit/mL (3 mL) pen; Inject 28 Units under the skin 2 (two) times a day.  Dispense: 3 mL; Refill: 3    This Document was created using the aid of voice recognition Scientist, clinical (histocompatibility and immunogenetics).       [1] Current Outpatient Medications  Medication Sig Dispense Refill  . aspirin 81 mg EC tablet Take 81 mg by mouth Once Daily.    . [Paused] atorvastatin (Lipitor) 20 mg tablet HOLD STARTING 02/28/23 - ELEVATED LFT (Patient not taking: Reported on 06/28/2023)    . blood-glucose sensor (FreeStyle Libre 3 Sensor) devi 1 each by miscellaneous route 4 (four) times a day before meals and nightly. 3 each 5  . blood-glucose sensor (FreeStyle Libre 3 Sensor) Use 1 sensor every 14 days 6 each 3  . carvediloL  (COREG ) 3.125 mg tablet Take 1 tablet (3.125 mg total) by mouth in the morning and 1 tablet (3.125 mg total) in the evening. Take with meals. 60 tablet 5  .  ezetimibe (ZETIA) 10 mg tablet Take 1 tablet (10 mg total) by mouth daily Indications: excessive fat in the blood. 90 tablet 3  . glucose blood (Precision Q-I-D Test) test strip Use 3 times daily 100 strip 3  . insulin  glargine (Lantus Solostar U-100 Insulin ) 100 unit/mL (3 mL) pen Inject 28 Units under the skin 2 (two) times a day. 3 mL 3  . insulin  lispro-aabc (Lyumjev KwikPen U-100 Insulin ) 100 unit/mL pen Use as directed. Max daily dose is 100 units. 90 mL 1  . lancets 33 gauge misc Use 3 times daily 300 each 3  . metroNIDAZOLE (FLAGYL) 500 mg tablet Take 1 tablet (500 mg total) by mouth 3 (three) times a day for 14 days. 42 tablet 0  . [Paused] mycophenolate (MYFORTIC) 180 mg TbEC DR tablet Take 2 tablets (360 mg total) by mouth every morning AND 1 tablet (180 mg total) every evening. (Patient not taking: Reported on 06/28/2023) 270 tablet 1  . nitroglycerin (NITROSTAT) 0.4 mg SL tablet Place 1 tablet (0.4 mg total) under the tongue every 5 (five) minutes as needed for chest pain. (Patient not taking: Reported on  07/04/2023) 25 tablet 0  . omeprazole (PriLOSEC) 20 mg DR capsule Take 1 capsule (20 mg total) by mouth daily before breakfast. 30 capsule 5  . pen needle, diabetic (BD Nano 2nd Gen Pen Needle) 32 gauge x 5/32 ndle Use 4 times daily 400 each 3  . polyethylene glycol (GLYCOLAX) 17 gram packet Take 17 g by mouth daily as needed. (Patient not taking: Reported on 07/04/2023)    . predniSONE  (DELTASONE ) 10 mg tablet Take 1 tablet (10 mg total) by mouth daily. 30 tablet 0  . semaglutide (Ozempic) 0.25 mg or 0.5 mg (2 mg/3 mL) subcutaneous pen injector Inject 0.5 mg under the skin once a week. 9 mL 1  . sennosides-docusate sodium (PERICOLACE) 8.6-50 mg per tablet Take 1 tablet by mouth 2 (two) times a day as needed for constipation. (Patient not taking: Reported on 07/04/2023)    . tacrolimus  (PROGRAF ) 1 mg capsule Take 1 capsule (1 mg total) by mouth 2 (two) times a day. 60 capsule 2  . timolol   (TIMOPTIC ) 0.5 % ophthalmic solution Administer 1 drop into left eye Once Daily.     No current facility-administered medications for this visit.  [2] Allergies Allergen Reactions  . Cefazolin  Anaphylaxis    Administered for surgical prophylaxis along with platelets and pt subsequently had presumed allergic reaction with hypotension, tachycardia elevate peak pressures

## 2023-11-21 ENCOUNTER — Ambulatory Visit: Admitting: Internal Medicine

## 2023-11-21 ENCOUNTER — Encounter: Payer: Self-pay | Admitting: Internal Medicine

## 2023-11-21 VITALS — BP 118/70 | HR 78 | Temp 98.7°F | Resp 18 | Ht 70.0 in | Wt 170.6 lb

## 2023-11-21 DIAGNOSIS — I10 Essential (primary) hypertension: Secondary | ICD-10-CM

## 2023-11-21 DIAGNOSIS — E113319 Type 2 diabetes mellitus with moderate nonproliferative diabetic retinopathy with macular edema, unspecified eye: Secondary | ICD-10-CM

## 2023-11-21 DIAGNOSIS — Z794 Long term (current) use of insulin: Secondary | ICD-10-CM

## 2023-11-21 DIAGNOSIS — E1165 Type 2 diabetes mellitus with hyperglycemia: Secondary | ICD-10-CM | POA: Diagnosis not present

## 2023-11-21 DIAGNOSIS — Z6824 Body mass index (BMI) 24.0-24.9, adult: Secondary | ICD-10-CM | POA: Insufficient documentation

## 2023-11-21 DIAGNOSIS — Z0000 Encounter for general adult medical examination without abnormal findings: Secondary | ICD-10-CM | POA: Diagnosis not present

## 2023-11-21 DIAGNOSIS — Z8546 Personal history of malignant neoplasm of prostate: Secondary | ICD-10-CM

## 2023-11-21 DIAGNOSIS — K760 Fatty (change of) liver, not elsewhere classified: Secondary | ICD-10-CM

## 2023-11-21 DIAGNOSIS — K219 Gastro-esophageal reflux disease without esophagitis: Secondary | ICD-10-CM

## 2023-11-21 DIAGNOSIS — Z862 Personal history of diseases of the blood and blood-forming organs and certain disorders involving the immune mechanism: Secondary | ICD-10-CM

## 2023-11-21 DIAGNOSIS — I2510 Atherosclerotic heart disease of native coronary artery without angina pectoris: Secondary | ICD-10-CM | POA: Diagnosis not present

## 2023-11-21 DIAGNOSIS — N2581 Secondary hyperparathyroidism of renal origin: Secondary | ICD-10-CM

## 2023-11-21 DIAGNOSIS — E7800 Pure hypercholesterolemia, unspecified: Secondary | ICD-10-CM

## 2023-11-21 DIAGNOSIS — Z940 Kidney transplant status: Secondary | ICD-10-CM

## 2023-11-21 DIAGNOSIS — E1159 Type 2 diabetes mellitus with other circulatory complications: Secondary | ICD-10-CM

## 2023-11-21 DIAGNOSIS — E1121 Type 2 diabetes mellitus with diabetic nephropathy: Secondary | ICD-10-CM

## 2023-11-21 DIAGNOSIS — N189 Chronic kidney disease, unspecified: Secondary | ICD-10-CM | POA: Insufficient documentation

## 2023-11-21 DIAGNOSIS — R748 Abnormal levels of other serum enzymes: Secondary | ICD-10-CM

## 2023-11-21 MED ORDER — CARVEDILOL 3.125 MG PO TABS: 3.1250 mg | ORAL_TABLET | Freq: Two times a day (BID) | ORAL | 3 refills | Status: AC

## 2023-11-21 NOTE — Assessment & Plan Note (Signed)
His BP is currently controlled.  We will continue to monitor.

## 2023-11-21 NOTE — Assessment & Plan Note (Signed)
 GI has noted that he has DILI.  They have recommended multiple studies included a MRCP and a fibroscan.  These have not been set up.  We will recheck the patient's liver enzymes.   He needs to go back and see GI per their notes.

## 2023-11-21 NOTE — Assessment & Plan Note (Signed)
 We will recheck his LFT's at this time.

## 2023-11-21 NOTE — Assessment & Plan Note (Signed)
 We will check his PSA today.

## 2023-11-21 NOTE — Assessment & Plan Note (Signed)
 Plan as above.

## 2023-11-21 NOTE — Assessment & Plan Note (Signed)
 I have asked the patient to discuss with GI whether he can be placed on a statin with his history of CAD and diabetes.  We will check a FLP today.

## 2023-11-21 NOTE — Assessment & Plan Note (Signed)
 The patient denies any angina at this time.  He has had stents in the past.  He is followed by cardiology with last visit in 12/2022.  The patient is not on a statin at this time.  He remains on ASA and beta blocker.

## 2023-11-21 NOTE — Assessment & Plan Note (Signed)
 The patient's HgBa1c was noted from endocrinology.  I gave him a sample of ozempic and asked him to start at 0.25mg  subcut weekly then move on to the 0.5mg  dosage.  His diabetic foot exam was normal.  We will check his urine studies.  I have asked him to be compliant with the use of his insulins.

## 2023-11-21 NOTE — Assessment & Plan Note (Signed)
 I have asked the patient to have his yearly dilated eye exams done and to make sure he gets this done this year as he already has a history of diabetic retinopathy.

## 2023-11-21 NOTE — Assessment & Plan Note (Signed)
 He will continue on omeprazole at this time.

## 2023-11-21 NOTE — Assessment & Plan Note (Signed)
I want him to eat healthy and continue to exercise and be active.

## 2023-11-21 NOTE — Progress Notes (Addendum)
 Preventive Screening-Counseling & Management    Jason Zimmerman is a 68 year old Caucasian/White male who presents for his annual health maintenance exam. This patient's past medical history CAD, Chronic Renal Failure, Hyperlipidemia, Hypertension, Myocardial Infarction, Type 2 diabetes mellitus with diabetic neuropathy, with long-term current use of insulin , and chronic anemia, history of prostate.    His last dilated eye exam by Dr. Elner in [place] was done on 11/24/2022 where he has severe proliferative retinopathy and macular edema.  They have been doing injections in his eyes.  He has had laser surgery done in the past.  He states there has been no worsening of his vision.  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 His last colonoscopy was done in 02/13/2018 that showed a 2mm rectal hyperplastic polyp, otherwise normal and they want to repeat this in 10 years.  He was having problems with dysphagia and GERD and saw GI in 12/2017.  He had a repeat EGD done in 03/14/2022 due to reflux and dysphagia where they found Grade I varices in lower third of the esophagus as well as mild stenosis.  Due to the esophageal varices, they opted to not dilate.  There was diffuse moderate inflammation with gastritis in the stomach.  There was localized moderately erythmatous mucosa without active bleeding of the duodenal bulb.  His previous EGD done in 02/13/2018 showed moderate stenosis at 39 cm where he was dilated.  They also found LA Grade B esophagitis, Hill grade 3 valve, mild gastritis, duodenitis.  The patient followed up with GI in 03/31/2022 where he underwent capsule endoscopy which showed gastritis with copious amounts of dark fluid/material in stomach and throughout small bowel with some obscuring visualization.  No fresh bleeding or clear stigmata of bleeding.  Highest suspicion for intermittent oozing from gastritis and recommend surveillance CBCs and  intermittent iron panels with IV iron as needed.  He is on prilosec 20mg  daily and he denies any dysphagia or reflux symptoms today.  He has a history of prostate cancer where he had a prostatectomy in 2011. He has been in cancer remission. The patient denies any problems with urination. He is not seeing a urologist recently with his last visit about 9-10 years ago. 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 He does get yearly flu vaccine. He had a pneumovax 23 vaccine in 2014. I wrote him for the Prevnar 13 and shingrix vaccine in 2021 but he never did go get this done.  He has 6 COVID-19 vaccines including 4 boosters.  The patient denies any depression, anxiety or memory loss. He is on an ASA 81mg  daily.   The patient is a 68 year old Caucasian/White male who returns for a follow-up visit for his T2 diabetes.  He was diagnosed with diabetes in 2004.   On his last visit, I asked him to increase his lantus to 27 Units BID.  He saw the transplant team this past year and they said he was losing too much weight.  The patient then stopped ozempic on his own without any physician telling him to stop.  He also contracted COVID-19 and had a short hospital stay which could have effected his weight.  He did see endocrinology on 10/26/2023 and he asked the patient to increase his lantus to 30 Units BID and to start on ozempic 0.5mg  subcut weekly but the patient has not received  the ozempic yet.  He did check his HgBa1c on 10/26/2023 and it was 13.2%.  There was mention in the notes about the patient not being compliant with his insulin  use.  He is currently on:  lantus 27 Units BID and lyumjev sliding scale which he takes 2 times per day.   He specifically denies unexplained abdominal pain, nausea or vomiting, diarrhea or documented hypoglycemia. He uses the [employer] CGM and his sugars range somewhere in AM 90-200 and at night 90-200.  His last HgbA1c was done on 10/26/2023  by  transplant and it was 13.2%.   He does have long term complications of CAD/PVD, diabetic retinopathy and diabetic nephropathy but no diabetic neuropathy.  His last dilated eye exam by Dr. Elner in [place] was done on 11/24/2022 where he has severe proliferative retinopathy and macular edema.    The patient is a 68 year old Caucasian/White male who presents for a follow-up evaluation of hypertension.  Since his last visit, he has not had any problems.  This past year, we have noted some elevated blood pressures.  The patient has been checking his blood pressure at home.  The patient states his systolic BP runs in the 120-140's.  The patient's current medications include: coreg  3.125mg  po BID. The patient has been tolerating his medications well. The patient denies any headache, visual changes, dizziness, lightheadness, chest pain, shortness of breath, weakness/numbness, and edema. He reports there have been no other symptoms noted.    Jason Zimmerman returns today for routine followup on his cholesterol.  He was on atorvastatin but was taken off this medication a few weeks ago from what her thinks is due to elevated liver enzymes.  His LDL needs to be <55 with his history of CAD and DM but he cannot take a statin with his recent liver enzymes  Overall, he states he is doing well and is without any complaints or problems at this time. He specifically denies chest pain, abdominal pain, nausea, diarrhea, and myalgias. He remains on dietary management as well as the following cholesterol lowering medications zetia 10mg  daily. He is fasting in anticipation for labs today.    This patient also has moderately severe CAD where he sustained a STEMI in 10/2010. He was taken to [facility] where he tells me he had a 100% occlusion of his left main. They ended up doing angioplasty and stent placement.  Unfortunately, his course was complicated in the Cath lab by cardiac arrest where he was successfully  resusciated.   The patient is currently followed by Dr. Alverna Kipper in Cardiology with last visit in 12/2022 where they felt his CAD was stable.  See PMH for summary of cardiac history and status of revascularization. His CAD is controlled with therapy as summarized in the medication list and previous notes. The patient has no comorbid conditions. He has no baseline symptoms of CAD. He has the following modifiable risk factor(s): HTN, DM, and hyperlipidemia. Specifically denied complaint(s): chest pain, palpitations, orthopnea, edema, exertional dyspnea, and syncope.  He goes back to see cardiology in 01/2024.    Jason Zimmerman returns today for routine followup on his cholesterol.  He was on atorvastatin but was taken off this medication due to elevated liver enzymes.  Overall, he states he is doing well and is without any complaints or problems at this time. He specifically denies chest pain, abdominal pain, nausea, diarrhea, and myalgias. He remains on dietary management as well as the following cholesterol lowering  medications zetia 10mg  daily. He is fasting in anticipation for labs today.   The has been noted to have an elevated alkaline phosphatase and liver enzymes which were noted by the transplant team.  He did see his GI doctor on 04/19/2023.  They did blood work at that time and noted he had some elevated liver enzymes with suspicion for DILI based on temp relationship of liver enzyme elevation and medications.  He was previosly on Bactrim and stopped this and his liver enzymes down trended this past year.  Bactrim was restarted and enzymes were uptrending.  Lipitor was stopped, restarted, then stopped again.  He is not on cholesterol meds at this time. He had a liver US  at [facility] showing steatosis. Viral hepatitis titers were negative.  They have been following his liver enzymes where his last liver enzymes as described above.  He does have a history of steatosis.  He was referred to GI at  [facility] where he saw them on 07/04/2023 where they noted he had an elevated ALP.  They ordered a MRI with MRCP to better evaluate his biliary anatomy. Additionally he was found to have likely nonmobile cholelithiasis however interval imaging was recommended.  They have ordered a fibroscan but I do not see the results of that.    The patient has a history of ESRD secondary to T2 diabetes and HTN where he underwent renal transplantation in 04/10/2022.  His last visit with the transplant team was on 06/28/2023. 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 Patient aware to restart once antibiotic therapy is completed where he completed this on 07/06/2023.  They also increased his Prednisone  from 5 mg daily to 10 mg daily as mycophenolate was placed on hold. Taper back down to 5 mg once antibiotic therapy is complete.  He states he is now on prednisone  5mg  daily.  They have been checking his prograf  levels and these look good and they felt his tranplant was stable.  He remains on prednisone  5mg  daily and prograf .  His neprhologist is Dr. Bernardino Gasman at [facility]  The patient continues to avoid NSAIDS at this time.  They had him on oral bicarb for renal tubular acidosis but he is no longer on this.  They also follow him for anemia of chronic renal failure.  He has secondary hyperparathyroidism as well.   This past year, he sustained fractures to the right acetabulum and left first metacarpal base on 01/09/2023. Ortho saw him and referred him to physical therapy for progression of weightbearing, gait training, and balance.  He was also hospitalized in 03/2023 after he had a fall and a resultant right femoral neck fracture.  Ortho was consulted and the patient underwent right hip hemiarthoplasty on 03/28/2023.  He had some post op anemia where his HgB dropped from 12.1 to 8.7.  He was asymptomatic and he did not need transfusion and sent him home  on iron.  He received homehealth PT.            Are there smokers in your home (other than you)? No  Risk Factors Current exercise habits: as above  Dietary issues discussed: none   Depression Screen (Note: if answer to either of the following is Yes, a more complete depression screening is indicated)   Over the past two weeks, have you felt down, depressed or hopeless? No  Over the past two weeks, have you felt little  interest or pleasure in doing things? No  Have you lost interest or pleasure in daily life? No  Do you often feel hopeless? No  Do you cry easily over simple problems? No  Activities of Daily Living In your present state of health, do you have any difficulty performing the following activities?:  Driving? No Managing money?  No Feeding yourself? No Getting from bed to chair? No Climbing a flight of stairs? No Preparing food and eating?: No Bathing or showering? No Getting dressed: No Getting to the toilet? No Using the toilet:No Moving around from place to place: No In the past year have you fallen or had a near fall?:Yes   Hearing Difficulties: No Do you often ask people to speak up or repeat themselves? Yes Do you experience ringing or noises in your ears? No Do you have difficulty understanding soft or whispered voices? No   Do you feel that you have a problem with memory? No  Do you often misplace items? No  Do you feel safe at home?  Yes  Cognitive Testing  Alert? Yes  Normal Appearance?Yes  Oriented to person? Yes  Place? Yes   Time? Yes  Recall of three objects?  Yes  Can perform simple calculations? Yes  Displays appropriate judgment?Yes  Can read the correct time from a watch face?Yes  Fall Risk Prevention  Any stairs in or around the home? No  If so, are there any without handrails? No  Home free of loose throw rugs in walkways, pet beds, electrical cords, etc? Yes  Adequate lighting in your home to reduce risk of falls? Yes   Use of a cane, walker or w/c? No    Time Up and Go  Was the test performed? Yes .  Length of time to ambulate 10 feet: 7 sec.   Gait steady and fast without use of assistive device    Advanced Directives have been discussed with the patient? Yes   List the Names of Other Physician/Practitioners you currently use: Patient Care Team: Fleeta Finger, Selinda, MD as PCP - General (Internal Medicine) Renda Glance, MD as Attending Physician (Urology) Elner Arley LABOR, MD as Attending Physician (Ophthalmology)    Past Medical History:  Diagnosis Date   Anemia associated with chronic renal failure    CKD (chronic kidney disease), stage V (HCC)    Coronary disease    Diabetic nephropathy associated with type 2 diabetes mellitus (HCC)    Diabetic retinopathy (HCC)    Fatty liver    per patient's wife   GERD (gastroesophageal reflux disease) 03/14/2022   no meds   Heart attack (HCC) 10/19/2010   History of prostate cancer    Hyperlipidemia    Hypertension, renal    Ocular hypertension    Prostate cancer (HCC) 2011   RTA (renal tubular acidosis)    Secondary hyperparathyroidism, renal (HCC)    Vitamin D deficiency     Past Surgical History:  Procedure Laterality Date   APPENDECTOMY     AV FISTULA PLACEMENT Left 03/29/2022   Procedure: LEFT ARM ARTERIOVENOUS (AV) FISTULA VERSUS ARTERIOVENOUS GRAFT CREATION;  Surgeon: Oris Krystal FALCON, MD;  Location: [facility] OR;  Service: Vascular;  Laterality: Left;   BACK SURGERY     BIOPSY  02/13/2018   Procedure: BIOPSY;  Surgeon: San Sandor GAILS, DO;  Location: [facility];  Service: Gastroenterology;;   COLONOSCOPY WITH PROPOFOL  N/A 02/13/2018   Procedure: COLONOSCOPY WITH PROPOFOL ;  Surgeon: San Sandor GAILS, DO;  Location: [facility];  Service: Gastroenterology;  Laterality: N/A;   CORONARY ANGIOPLASTY     by notes, LAD stent placed 10/2010 in setting of MI   ESOPHAGEAL DILATION  02/13/2018   Procedure: ESOPHAGEAL DILATION;  Surgeon:  San Sandor GAILS, DO;  Location: [facility];  Service: Gastroenterology;;   ESOPHAGEAL MANOMETRY N/A 10/05/2022   Procedure: ESOPHAGEAL MANOMETRY (EM);  Surgeon: San Sandor GAILS, DO;  Location: [facility];  Service: Gastroenterology;  Laterality: N/A;   ESOPHAGOGASTRODUODENOSCOPY (EGD) WITH PROPOFOL  N/A 02/13/2018   Procedure: ESOPHAGOGASTRODUODENOSCOPY (EGD) WITH PROPOFOL ;  Surgeon: San Sandor GAILS, DO;  Location: [facility];  Service: Gastroenterology;  Laterality: N/A;   NASAL SEPTUM SURGERY  1981   PARTIAL HIP ARTHROPLASTY Right 03/28/2023   PROSTATE SURGERY     QUADRICEPS TENDON REPAIR Right 02/16/2021   Procedure: RIGHT KNEE OPEN REPAIR QUADRICEP TENDON;  Surgeon: Sharl Selinda Dover, MD;  Location: [facility] OR;  Service: Orthopedics;  Laterality: Right;   UPPER GI ENDOSCOPY  03/14/2022   VITRECTOMY  12/21/2006      Current Medications  Current Outpatient Medications  Medication Sig Dispense Refill   acetaminophen  (TYLENOL ) 500 MG tablet Take 1,000 mg by mouth every 6 (six) hours as needed (pain.).     aspirin EC 81 MG tablet Take 81 mg by mouth at bedtime.     carvedilol  (COREG ) 3.125 MG tablet Take 1 tablet (3.125 mg total) by mouth 2 (two) times daily with a meal. 180 tablet 3   Continuous Glucose Receiver (DEXCOM G7 RECEIVER) DEVI Continuous Glucose Monitor Receiver 3 each 2   Continuous Glucose Sensor (DEXCOM G7 SENSOR) MISC Change  sensor every 10 days 3 each 5   ezetimibe (ZETIA) 10 MG tablet Take 10 mg by mouth in the morning.     glucose blood (FREESTYLE TEST STRIPS) test strip 1 each by Other route 2 (two) times daily as needed for other. PRN 102 each 5   insulin  glargine (LANTUS) 100 UNIT/ML Solostar Pen Inject 48 Units into the skin at bedtime.     LYUMJEV KWIKPEN 100 UNIT/ML KwikPen Inject into the skin.     mycophenolate (MYFORTIC) 180 MG EC tablet Take 180 mg by mouth 2 (two) times daily.     nitroGLYCERIN (NITROSTAT) 0.4 MG SL tablet Place 0.4 mg under the  tongue every 5 (five) minutes x 3 doses as needed for chest pain.     omeprazole (PRILOSEC) 20 MG capsule Take 20 mg by mouth daily.     ondansetron  (ZOFRAN ) 4 MG tablet Take 1 tablet (4 mg total) by mouth every 8 (eight) hours as needed for nausea or vomiting. 20 tablet 0   oxyCODONE  (OXY IR/ROXICODONE ) 5 MG immediate release tablet Take 5 mg by mouth every 6 (six) hours as needed.     predniSONE  (DELTASONE ) 10 MG tablet Take 1 tablet (10 mg total) by mouth daily with breakfast. 90 tablet 3   SENNA S 8.6-50 MG tablet Take 2 tablets by mouth as needed.     tacrolimus  (PROGRAF ) 1 MG capsule Take 1.5 mg by mouth 2 (two) times daily.     timolol  (TIMOPTIC ) 0.5 % ophthalmic solution Place 1 drop into the left eye 2 (two) times daily. 10 mL 2   No current facility-administered medications for this visit.    Allergies Cefazolin    Social History Social History   Tobacco Use   Smoking status: Never    Passive exposure: Never   Smokeless tobacco: Never  Substance Use Topics   Alcohol  use: Not Currently  Review of Systems Review of Systems  Constitutional:  Negative for chills, fever, malaise/fatigue and weight loss.  Eyes:  Negative for blurred vision and double vision.  Respiratory:  Negative for cough, hemoptysis, shortness of breath and wheezing.   Cardiovascular:  Negative for chest pain, palpitations and leg swelling.  Gastrointestinal:  Negative for abdominal pain, blood in stool, constipation, diarrhea, heartburn, melena, nausea and vomiting.  Genitourinary:  Negative for frequency and hematuria.  Musculoskeletal:  Negative for myalgias.  Skin:  Negative for itching and rash.  Neurological:  Negative for dizziness, weakness and headaches.  Endo/Heme/Allergies:  Negative for polydipsia.  Psychiatric/Behavioral:  Negative for depression and memory loss. The patient is not nervous/anxious.      Physical Exam:      Body mass index is 24.48 kg/m. BP 118/70   Pulse 78    Temp 98.7 F (37.1 C)   Resp 18   Ht 5' 10 (1.778 m)   Wt 170 lb 9.6 oz (77.4 kg)   SpO2 98%   BMI 24.48 kg/m   Physical Exam Constitutional:      Appearance: Normal appearance. He is not ill-appearing.  HENT:     Head: Normocephalic and atraumatic.     Right Ear: Tympanic membrane, ear canal and external ear normal.     Left Ear: Tympanic membrane, ear canal and external ear normal.     Nose: Nose normal. No congestion or rhinorrhea.     Mouth/Throat:     Mouth: Mucous membranes are dry.     Pharynx: Oropharynx is clear. No oropharyngeal exudate or posterior oropharyngeal erythema.  Eyes:     General: No scleral icterus.    Conjunctiva/sclera: Conjunctivae normal.     Pupils: Pupils are equal, round, and reactive to light.  Neck:     Vascular: No carotid bruit.  Cardiovascular:     Rate and Rhythm: Normal rate and regular rhythm.     Pulses: Normal pulses.     Heart sounds: No murmur heard.    No friction rub. No gallop.  Pulmonary:     Effort: Pulmonary effort is normal. No respiratory distress.     Breath sounds: No wheezing, rhonchi or rales.  Abdominal:     General: Abdomen is flat. Bowel sounds are normal. There is no distension.     Palpations: Abdomen is soft.     Tenderness: There is no abdominal tenderness.  Musculoskeletal:     Cervical back: Neck supple. No tenderness.     Right lower leg: No edema.     Left lower leg: No edema.  Lymphadenopathy:     Cervical: No cervical adenopathy.  Skin:    General: Skin is warm and dry.     Findings: No rash.  Neurological:     General: No focal deficit present.     Mental Status: He is alert and oriented to person, place, and time.  Psychiatric:        Mood and Affect: Mood normal.        Behavior: Behavior normal.      Assessment:      Inadequately controlled diabetes mellitus (HCC) - Plan: CBC with Differential/Platelet, CMP14 + Anion Gap, Hemoglobin A1c, Microalbumin / creatinine urine ratio,  TSH  Kidney transplanted  Essential hypertension  Hypercholesterolemia - Plan: Lipid panel  Coronary artery disease involving native coronary artery of native heart without angina pectoris  Diabetic nephropathy associated with type 2 diabetes mellitus (HCC)  Steatosis of liver  NAFL (nonalcoholic fatty  liver)  Chronic kidney disease, unspecified CKD stage  Moderate nonproliferative diabetic retinopathy with macular edema associated with type 2 diabetes mellitus, unspecified laterality (HCC)  History of anemia due to chronic kidney disease  Type 2 diabetes mellitus with other circulatory complication, with long-term current use of insulin  (HCC)  Gastroesophageal reflux disease without esophagitis  History of prostate cancer - Plan: PSA  Secondary hyperparathyroidism (HCC)  BMI 24.0-24.9, adult  Elevated liver enzymes    Plan:     During the course of the visit the patient was educated and counseled about appropriate screening and preventive services including:   Pneumococcal vaccine  Influenza vaccine Colorectal cancer screening Advanced directives: discussed  Diet review for nutrition referral? Yes ____  Not Indicated __X__   Patient Instructions (the written plan) was given to the patient.  CAD (coronary artery disease) The patient denies any angina at this time.  He has had stents in the past.  He is followed by cardiology with last visit in 12/2022.  The patient is not on a statin at this time.  He remains on ASA and beta blocker.  Essential hypertension His BP is currently controlled.  We will continue to monitor.  Gastroesophageal reflux disease He will continue on omeprazole at this time.  NAFL (nonalcoholic fatty liver) GI has noted that he has DILI.  They have recommended multiple studies included a MRCP and a fibroscan.  These have not been set up.  We will recheck the patient's liver enzymes.   He needs to go back and see GI per their  notes.  Steatosis of liver Plan as above.  Diabetic nephropathy associated with type 2 diabetes mellitus (HCC) The patient's HgBa1c was noted from endocrinology.  I gave him a sample of ozempic and asked him to start at 0.25mg  subcut weekly then move on to the 0.5mg  dosage.  His diabetic foot exam was normal.  We will check his urine studies.  I have asked him to be compliant with the use of his insulins.  Inadequately controlled diabetes mellitus (HCC) Plan as above.  Moderate nonproliferative diabetic retinopathy with macular edema associated with type 2 diabetes mellitus (HCC) I have asked the patient to have his yearly dilated eye exams done and to make sure he gets this done this year as he already has a history of diabetic retinopathy.  Chronic kidney disease The patient is followed by the transplant team and their last note was reviewed.  They are following his mycophenolate levels and he remains on prednisone  daily.    BMI 24.0-24.9, adult I want him to eat healthy and continue to exercise and be active.  Elevated liver enzymes We will recheck his LFT's at this time.  History of prostate cancer We will check his PSA today.  History of anemia due to chronic kidney disease We will check his CBC at this time.    Hypercholesterolemia I have asked the patient to discuss with GI whether he can be placed on a statin with his history of CAD and diabetes.  We will check a FLP today.  Kidney transplanted Plan as above.  Prevention Health maintenance discussed.  We will obtain some yearly labs.  Medicare Attestation I have personally reviewed: The patient's medical and social history Their use of alcohol , tobacco or illicit drugs Their current medications and supplements The patient's functional ability including ADLs,fall risks, home safety risks, cognitive, and hearing and visual impairment Diet and physical activities Evidence for depression or mood disorders  The  patient's weight, height, and BMI have been recorded in the chart.  I have made referrals, counseling, and provided education to the patient based on review of the above and I have provided the patient with a written personalized care plan for preventive services.     Selinda Fleeta Finger, MD   11/21/2023

## 2023-11-21 NOTE — Assessment & Plan Note (Signed)
We will check his CBC at this time.

## 2023-11-21 NOTE — Assessment & Plan Note (Signed)
 The patient is followed by the transplant team and their last note was reviewed.  They are following his mycophenolate levels and he remains on prednisone  daily.

## 2023-11-22 LAB — CBC WITH DIFFERENTIAL/PLATELET
Basophils Absolute: 0 x10E3/uL (ref 0.0–0.2)
Basos: 0 %
EOS (ABSOLUTE): 0.2 x10E3/uL (ref 0.0–0.4)
Eos: 3 %
Hematocrit: 37.2 % — ABNORMAL LOW (ref 37.5–51.0)
Hemoglobin: 11.7 g/dL — ABNORMAL LOW (ref 13.0–17.7)
Immature Grans (Abs): 0 x10E3/uL (ref 0.0–0.1)
Immature Granulocytes: 0 %
Lymphocytes Absolute: 1.5 x10E3/uL (ref 0.7–3.1)
Lymphs: 19 %
MCH: 28.3 pg (ref 26.6–33.0)
MCHC: 31.5 g/dL (ref 31.5–35.7)
MCV: 90 fL (ref 79–97)
Monocytes Absolute: 0.7 x10E3/uL (ref 0.1–0.9)
Monocytes: 9 %
Neutrophils Absolute: 5.3 x10E3/uL (ref 1.4–7.0)
Neutrophils: 69 %
Platelets: 107 x10E3/uL — ABNORMAL LOW (ref 150–450)
RBC: 4.13 x10E6/uL — ABNORMAL LOW (ref 4.14–5.80)
RDW: 14.5 % (ref 11.6–15.4)
WBC: 7.7 x10E3/uL (ref 3.4–10.8)

## 2023-11-22 LAB — CMP14 + ANION GAP
ALT: 25 IU/L (ref 0–44)
AST: 27 IU/L (ref 0–40)
Albumin: 4.1 g/dL (ref 3.9–4.9)
Alkaline Phosphatase: 169 IU/L — ABNORMAL HIGH (ref 44–121)
Anion Gap: 15 mmol/L (ref 10.0–18.0)
BUN/Creatinine Ratio: 13 (ref 10–24)
BUN: 20 mg/dL (ref 8–27)
Bilirubin Total: 0.9 mg/dL (ref 0.0–1.2)
CO2: 21 mmol/L (ref 20–29)
Calcium: 10.1 mg/dL (ref 8.6–10.2)
Chloride: 103 mmol/L (ref 96–106)
Creatinine, Ser: 1.58 mg/dL — ABNORMAL HIGH (ref 0.76–1.27)
Globulin, Total: 2.8 g/dL (ref 1.5–4.5)
Glucose: 182 mg/dL — ABNORMAL HIGH (ref 70–99)
Potassium: 4.7 mmol/L (ref 3.5–5.2)
Sodium: 139 mmol/L (ref 134–144)
Total Protein: 6.9 g/dL (ref 6.0–8.5)
eGFR: 47 mL/min/1.73 — ABNORMAL LOW (ref >59)

## 2023-11-22 LAB — MICROALBUMIN / CREATININE URINE RATIO
Creatinine, Urine: 190 mg/dL
Microalb/Creat Ratio: 29 mg/g{creat} (ref 0–29)
Microalbumin, Urine: 54.7 ug/mL

## 2023-11-22 LAB — LIPID PANEL
Chol/HDL Ratio: 4.9 ratio (ref 0.0–5.0)
Cholesterol, Total: 196 mg/dL (ref 100–199)
HDL: 40 mg/dL (ref >39)
LDL Chol Calc (NIH): 135 mg/dL — ABNORMAL HIGH (ref 0–99)
Triglycerides: 113 mg/dL (ref 0–149)
VLDL Cholesterol Cal: 21 mg/dL (ref 5–40)

## 2023-11-22 LAB — TSH: TSH: 2.04 u[IU]/mL (ref 0.450–4.500)

## 2023-11-22 LAB — HEMOGLOBIN A1C
Est. average glucose Bld gHb Est-mCnc: 355 mg/dL
Hgb A1c MFr Bld: 14 % — ABNORMAL HIGH (ref 4.8–5.6)

## 2023-11-22 LAB — PSA: Prostate Specific Ag, Serum: <0.1 ng/mL (ref 0.0–4.0)

## 2023-11-29 DIAGNOSIS — I129 Hypertensive chronic kidney disease with stage 1 through stage 4 chronic kidney disease, or unspecified chronic kidney disease: Secondary | ICD-10-CM | POA: Diagnosis not present

## 2023-11-29 DIAGNOSIS — E1122 Type 2 diabetes mellitus with diabetic chronic kidney disease: Secondary | ICD-10-CM | POA: Diagnosis not present

## 2023-11-29 DIAGNOSIS — N189 Chronic kidney disease, unspecified: Secondary | ICD-10-CM | POA: Diagnosis not present

## 2023-11-29 DIAGNOSIS — D631 Anemia in chronic kidney disease: Secondary | ICD-10-CM | POA: Diagnosis not present

## 2023-12-04 ENCOUNTER — Ambulatory Visit: Payer: Self-pay

## 2023-12-04 NOTE — Progress Notes (Signed)
 Patient called.  Patient aware.  I have called and informed the patient  Tell him his diabetes is not controlled.  He needs to take the ozempic I gave him and increase the dose in one month to ozempic 0.5mg  weekly.  His kidney function is stable.  His cholesterol is not controlled.  Please refer him to Dr. [facility] in [place]. SABRA  Pt aware, and I have forwarded the message to the referral coordinator.

## 2023-12-25 ENCOUNTER — Ambulatory Visit: Admitting: Internal Medicine

## 2023-12-25 VITALS — HR 78 | Ht 70.0 in | Wt 170.0 lb

## 2023-12-25 DIAGNOSIS — J029 Acute pharyngitis, unspecified: Secondary | ICD-10-CM

## 2023-12-25 LAB — POC COVID19 BINAXNOW: SARS Coronavirus 2 Ag: POSITIVE — AB

## 2024-01-04 DIAGNOSIS — I129 Hypertensive chronic kidney disease with stage 1 through stage 4 chronic kidney disease, or unspecified chronic kidney disease: Secondary | ICD-10-CM | POA: Diagnosis not present

## 2024-01-04 DIAGNOSIS — N184 Chronic kidney disease, stage 4 (severe): Secondary | ICD-10-CM | POA: Diagnosis not present

## 2024-01-04 DIAGNOSIS — Z794 Long term (current) use of insulin: Secondary | ICD-10-CM | POA: Diagnosis not present

## 2024-01-04 DIAGNOSIS — E1122 Type 2 diabetes mellitus with diabetic chronic kidney disease: Secondary | ICD-10-CM | POA: Diagnosis not present

## 2024-01-04 DIAGNOSIS — E1165 Type 2 diabetes mellitus with hyperglycemia: Secondary | ICD-10-CM | POA: Diagnosis not present

## 2024-01-04 DIAGNOSIS — E782 Mixed hyperlipidemia: Secondary | ICD-10-CM | POA: Diagnosis not present

## 2024-01-22 DIAGNOSIS — I25118 Atherosclerotic heart disease of native coronary artery with other forms of angina pectoris: Secondary | ICD-10-CM | POA: Diagnosis not present

## 2024-01-22 DIAGNOSIS — E782 Mixed hyperlipidemia: Secondary | ICD-10-CM | POA: Diagnosis not present

## 2024-01-22 DIAGNOSIS — E1122 Type 2 diabetes mellitus with diabetic chronic kidney disease: Secondary | ICD-10-CM | POA: Diagnosis not present

## 2024-01-22 DIAGNOSIS — I252 Old myocardial infarction: Secondary | ICD-10-CM | POA: Diagnosis not present

## 2024-01-26 DIAGNOSIS — I129 Hypertensive chronic kidney disease with stage 1 through stage 4 chronic kidney disease, or unspecified chronic kidney disease: Secondary | ICD-10-CM | POA: Diagnosis not present

## 2024-01-26 DIAGNOSIS — Z940 Kidney transplant status: Secondary | ICD-10-CM | POA: Diagnosis not present

## 2024-01-26 DIAGNOSIS — N189 Chronic kidney disease, unspecified: Secondary | ICD-10-CM | POA: Diagnosis not present

## 2024-02-12 ENCOUNTER — Institutional Professional Consult (permissible substitution) (HOSPITAL_BASED_OUTPATIENT_CLINIC_OR_DEPARTMENT_OTHER): Admitting: Internal Medicine

## 2024-02-14 DIAGNOSIS — N184 Chronic kidney disease, stage 4 (severe): Secondary | ICD-10-CM | POA: Diagnosis not present

## 2024-02-14 DIAGNOSIS — E1165 Type 2 diabetes mellitus with hyperglycemia: Secondary | ICD-10-CM | POA: Diagnosis not present

## 2024-02-14 DIAGNOSIS — I129 Hypertensive chronic kidney disease with stage 1 through stage 4 chronic kidney disease, or unspecified chronic kidney disease: Secondary | ICD-10-CM | POA: Diagnosis not present

## 2024-02-14 DIAGNOSIS — E785 Hyperlipidemia, unspecified: Secondary | ICD-10-CM | POA: Diagnosis not present

## 2024-02-14 DIAGNOSIS — Z794 Long term (current) use of insulin: Secondary | ICD-10-CM | POA: Diagnosis not present

## 2024-02-14 DIAGNOSIS — E1122 Type 2 diabetes mellitus with diabetic chronic kidney disease: Secondary | ICD-10-CM | POA: Diagnosis not present

## 2024-02-19 DIAGNOSIS — K136 Irritative hyperplasia of oral mucosa: Secondary | ICD-10-CM | POA: Diagnosis not present

## 2024-02-21 ENCOUNTER — Ambulatory Visit: Admitting: Internal Medicine

## 2024-02-21 ENCOUNTER — Encounter: Payer: Self-pay | Admitting: Internal Medicine

## 2024-02-21 VITALS — BP 132/74 | HR 79 | Temp 97.3°F | Resp 16 | Ht 70.0 in | Wt 173.8 lb

## 2024-02-21 DIAGNOSIS — Z940 Kidney transplant status: Secondary | ICD-10-CM | POA: Diagnosis not present

## 2024-02-21 DIAGNOSIS — Z23 Encounter for immunization: Secondary | ICD-10-CM

## 2024-02-21 DIAGNOSIS — E1165 Type 2 diabetes mellitus with hyperglycemia: Secondary | ICD-10-CM

## 2024-02-21 DIAGNOSIS — I10 Essential (primary) hypertension: Secondary | ICD-10-CM | POA: Diagnosis not present

## 2024-02-21 NOTE — Addendum Note (Signed)
 Addended by: LENETTA LACKS on: 02/21/2024 11:31 AM   Modules accepted: Orders

## 2024-02-21 NOTE — Assessment & Plan Note (Signed)
 He states his A1c was checked 2 weeks ago and came down from 14% down to 8.9%.  I do not have this A1c result from endo.  They did start him on ozempic and he is now on 1mg  weekly.

## 2024-02-21 NOTE — Assessment & Plan Note (Signed)
 I reviewed his notes from nephrology from 11/29/2023.  They are following his tacrolimus  levels.  His kidney function is stable.

## 2024-02-21 NOTE — Progress Notes (Signed)
 Office Visit  Subjective   Patient ID: Alek Borges   DOB: 10-02-1955   Age: 68 y.o.   MRN: [id number]   Chief Complaint Chief Complaint  Patient presents with   Follow-up    3 Month follow up     History of Present Illness The patient is a 68 year old Caucasian/White male who returns for a follow-up visit for his T2 diabetes.  He was diagnosed with diabetes in 2004.   The patient had a HgBa1c done on in 10/2023 and was 14%.  Over the interim, he did see endo on 01/22/2024 where they did increase his dose of ozempic to 1mg  weekly.  He is currently on:  lantus 28 Units BID and lyumjev sliding scale which he takes 2 times per day and ozempic 1mg  subcut weekly.   He specifically denies unexplained abdominal pain, nausea or vomiting, diarrhea or documented hypoglycemia. He uses the [employer] CGM and his sugars range somewhere in 80-150.  The patient went to endo a few weeks ago and states they rechecked his hgBA1c at that time and it was 8.9%.  I do not see these results.   He does have long term complications of CAD/PVD, diabetic retinopathy and diabetic nephropathy but no diabetic neuropathy.  His last dilated eye exam by Dr. Elner in [place] was done on 11/24/2022 where he has severe proliferative retinopathy and macular edema.     The patient is a 68 year old Caucasian/White male who presents for a follow-up evaluation of hypertension.  Since his last visit, he has not had any problems.  This past year, we have noted some elevated blood pressures.  The patient has been checking his blood pressure at home.  The patient states his systolic BP runs in the 120-140's.  The patient's current medications include: coreg  3.125mg  po BID. The patient has been tolerating his medications well. The patient denies any headache, visual changes, dizziness, lightheadness, chest pain, shortness of breath, weakness/numbness, and edema. He reports there have been no other symptoms noted.    The patient has  a history of ESRD secondary to T2 diabetes and HTN where he underwent renal transplantation in 04/10/2022.  His last visit with the transplant team was on 06/28/2023. 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 Patient aware to restart once antibiotic therapy is completed where he completed this on 07/06/2023.  They also increased his Prednisone  from 5 mg daily to 10 mg daily as mycophenolate was placed on hold. Taper back down to 5 mg once antibiotic therapy is complete.  He states he is now on prednisone  5mg  daily.  They have been checking his prograf  levels and these look good and they felt his tranplant was stable.  He remains on prednisone  5mg  daily and prograf .  His neprhologist is Dr. Bernardino Gasman at [facility] with last visit on 11/29/2023.  The patient continues to avoid NSAIDS at this time.  They had him on oral bicarb for renal tubular acidosis but he is no longer on this.  They also follow him for anemia of chronic renal failure.  He has secondary hyperparathyroidism as well.  Dr. Gasman is following his tacrolimus  levels       Past Medical History Past Medical History:  Diagnosis Date   Anemia associated with chronic renal failure    CKD (chronic kidney disease), stage V (HCC)    Coronary disease    Diabetic  nephropathy associated with type 2 diabetes mellitus (HCC)    Diabetic retinopathy (HCC)    Fatty liver    per patient's wife   GERD (gastroesophageal reflux disease) 03/14/2022   no meds   Heart attack (HCC) 10/19/2010   History of prostate cancer    Hyperlipidemia    Hypertension, renal    Ocular hypertension    Prostate cancer (HCC) 2011   RTA (renal tubular acidosis)    Secondary hyperparathyroidism, renal    Vitamin D deficiency      Allergies Allergies  Allergen Reactions   Cefazolin  Anaphylaxis    Administered for surgical prophylaxis along with platelets and pt subsequently had  presumed allergic reaction with hypotension, tachycardia elevate peak pressures     Medications  Current Outpatient Medications:    acetaminophen  (TYLENOL ) 500 MG tablet, Take 1,000 mg by mouth every 6 (six) hours as needed (pain.)., Disp: , Rfl:    aspirin EC 81 MG tablet, Take 81 mg by mouth at bedtime., Disp: , Rfl:    carvedilol  (COREG ) 3.125 MG tablet, Take 1 tablet (3.125 mg total) by mouth 2 (two) times daily with a meal., Disp: 180 tablet, Rfl: 3   Continuous Glucose Receiver (DEXCOM G7 RECEIVER) DEVI, Continuous Glucose Monitor Receiver, Disp: 3 each, Rfl: 2   Continuous Glucose Sensor (DEXCOM G7 SENSOR) MISC, Change  sensor every 10 days, Disp: 3 each, Rfl: 5   ezetimibe (ZETIA) 10 MG tablet, Take 10 mg by mouth in the morning., Disp: , Rfl:    glucose blood (FREESTYLE TEST STRIPS) test strip, 1 each by Other route 2 (two) times daily as needed for other. PRN, Disp: 102 each, Rfl: 5   insulin  glargine (LANTUS) 100 UNIT/ML Solostar Pen, Inject 48 Units into the skin at bedtime. (Patient taking differently: Inject 48 Units into the skin at bedtime. 28 units in the morning and 28 units at night), Disp: , Rfl:    LYUMJEV KWIKPEN 100 UNIT/ML KwikPen, Inject into the skin., Disp: , Rfl:    mycophenolate (MYFORTIC) 180 MG EC tablet, Take 180 mg by mouth 2 (two) times daily. (Patient taking differently: Take 180 mg by mouth 2 (two) times daily. 2 Tablets in the morning and 1 Tablet in the evening), Disp: , Rfl:    nitroGLYCERIN (NITROSTAT) 0.4 MG SL tablet, Place 0.4 mg under the tongue every 5 (five) minutes x 3 doses as needed for chest pain., Disp: , Rfl:    omeprazole (PRILOSEC) 20 MG capsule, Take 20 mg by mouth daily., Disp: , Rfl:    ondansetron  (ZOFRAN ) 4 MG tablet, Take 1 tablet (4 mg total) by mouth every 8 (eight) hours as needed for nausea or vomiting., Disp: 20 tablet, Rfl: 0   OZEMPIC, 1 MG/DOSE, 4 MG/3ML SOPN, Inject 1 mg into the skin once a week., Disp: , Rfl:    predniSONE   (DELTASONE ) 10 MG tablet, Take 1 tablet (10 mg total) by mouth daily with breakfast. (Patient taking differently: Take 5 mg by mouth daily with breakfast.), Disp: 90 tablet, Rfl: 3   tacrolimus  (PROGRAF ) 1 MG capsule, Take 1.5 mg by mouth 2 (two) times daily., Disp: , Rfl:    timolol  (TIMOPTIC ) 0.5 % ophthalmic solution, Place 1 drop into the left eye 2 (two) times daily., Disp: 10 mL, Rfl: 2   Review of Systems Review of Systems  Constitutional:  Negative for chills, fever, malaise/fatigue and weight loss.  Eyes:  Negative for blurred vision and double vision.  Respiratory:  Negative for  cough and shortness of breath.   Cardiovascular:  Negative for chest pain, palpitations and leg swelling.  Gastrointestinal:  Negative for abdominal pain, constipation, diarrhea, nausea and vomiting.  Genitourinary:  Negative for frequency.  Musculoskeletal:  Negative for myalgias.  Skin:  Negative for itching and rash.  Neurological:  Negative for dizziness, weakness and headaches.  Endo/Heme/Allergies:  Negative for polydipsia.       Objective:    Vitals BP 132/74   Pulse 79   Temp (!) 97.3 F (36.3 C) (Temporal)   Resp 16   Ht 5' 10 (1.778 m)   Wt 173 lb 12.8 oz (78.8 kg)   SpO2 98%   BMI 24.94 kg/m    Physical Examination Physical Exam Constitutional:      Appearance: Normal appearance. He is not ill-appearing.  Cardiovascular:     Rate and Rhythm: Normal rate and regular rhythm.     Pulses: Normal pulses.     Heart sounds: No murmur heard.    No friction rub. No gallop.  Pulmonary:     Effort: Pulmonary effort is normal. No respiratory distress.     Breath sounds: No wheezing, rhonchi or rales.  Abdominal:     General: Abdomen is flat. Bowel sounds are normal. There is no distension.     Palpations: Abdomen is soft.     Tenderness: There is no abdominal tenderness.  Musculoskeletal:     Right lower leg: No edema.     Left lower leg: No edema.  Skin:    General: Skin is warm  and dry.     Findings: No rash.  Neurological:     General: No focal deficit present.     Mental Status: He is alert and oriented to person, place, and time.  Psychiatric:        Mood and Affect: Mood normal.        Behavior: Behavior normal.        Assessment & Plan:   Essential hypertension His BP is controlled.  We will contiue his current medications.  Inadequately controlled diabetes mellitus (HCC) He states his A1c was checked 2 weeks ago and came down from 14% down to 8.9%.  I do not have this A1c result from endo.  They did start him on ozempic and he is now on 1mg  weekly.  Kidney transplanted I reviewed his notes from nephrology from 11/29/2023.  They are following his tacrolimus  levels.  His kidney function is stable.    Return in about 3 months (around 05/23/2024).   Selinda Fleeta Finger, MD

## 2024-02-21 NOTE — Assessment & Plan Note (Signed)
 His BP is controlled.  We will contiue his current medications.

## 2024-02-26 ENCOUNTER — Other Ambulatory Visit (HOSPITAL_COMMUNITY): Payer: Self-pay

## 2024-02-26 ENCOUNTER — Telehealth: Payer: Self-pay | Admitting: Pharmacy Technician

## 2024-02-26 ENCOUNTER — Ambulatory Visit (INDEPENDENT_AMBULATORY_CARE_PROVIDER_SITE_OTHER): Admitting: Internal Medicine

## 2024-02-26 ENCOUNTER — Encounter (HOSPITAL_BASED_OUTPATIENT_CLINIC_OR_DEPARTMENT_OTHER): Payer: Self-pay | Admitting: Internal Medicine

## 2024-02-26 ENCOUNTER — Encounter (HOSPITAL_COMMUNITY): Payer: Self-pay

## 2024-02-26 VITALS — BP 138/72 | HR 77 | Ht 70.0 in | Wt 171.4 lb

## 2024-02-26 DIAGNOSIS — K719 Toxic liver disease, unspecified: Secondary | ICD-10-CM | POA: Diagnosis not present

## 2024-02-26 DIAGNOSIS — E7800 Pure hypercholesterolemia, unspecified: Secondary | ICD-10-CM

## 2024-02-26 DIAGNOSIS — T466X5A Adverse effect of antihyperlipidemic and antiarteriosclerotic drugs, initial encounter: Secondary | ICD-10-CM

## 2024-02-26 DIAGNOSIS — E785 Hyperlipidemia, unspecified: Secondary | ICD-10-CM

## 2024-02-26 DIAGNOSIS — I2510 Atherosclerotic heart disease of native coronary artery without angina pectoris: Secondary | ICD-10-CM | POA: Diagnosis not present

## 2024-02-26 DIAGNOSIS — T466X5D Adverse effect of antihyperlipidemic and antiarteriosclerotic drugs, subsequent encounter: Secondary | ICD-10-CM | POA: Diagnosis not present

## 2024-02-26 MED ORDER — REPATHA SURECLICK 140 MG/ML ~~LOC~~ SOAJ: 140.0000 mg | SUBCUTANEOUS | 3 refills | Status: DC

## 2024-02-26 NOTE — Patient Instructions (Signed)
 Medication Instructions:  Dr. Mona recommends Repatha (PCSK9). This is an injectable cholesterol medication self-administered once every 14 days. This medication will likely need prior approval with your insurance company, which we will work on. If the medication is not approved initially, we may need to do an appeal with your insurance. If approved, we will provide you with copay and cost information. We'll then send the prescription to your pharmacy. We would have you complete another set of fasting labs between 3-4 months to reassess cholesterol.   Repatha is self-injected once every 14 days in subcutaneous or fatty tissue - such as belly or side/outer/upper thigh. It is best stored in the refrigerator but is stable at room temp up to 28 days. Please take the pen-injector out of fridge about 30 minutes - 1 hour prior to injection, to allow it to warm closer to room temperature.   This medication is very effective in lowering LDL and can lower LPa, as Dr. Mona mentioned. It is also generally well tolerated -- most common reaction may be cold-like symptoms such as runny nose, scratchy throat, as this is an antibody therapy. It is generally self-limiting and after a few doses, your body should have normalized to the medication.   Here is a demo video: [email or web address]   If you need a co-pay card for Repatha: lawsponsor.fr  Patient Assistance:    These foundations have funds at various times.   The [employer]: https://www.panfoundation.org/disease-funds/hypercholesterolemia/ -- can sign up for wait list  The [facility] offers assistance to help pay for medication copays.  They will cover copays for all cholesterol lowering meds, including statins, fibrates, omega-3 fish oils like Vascepa, ezetimibe, Repatha, Praluent, Nexletol, Nexlizet.  The cards are usually good for $2,500 or 12 months, whichever comes first. Our fax #  is [phone] (you will need this to apply) Go to healthwellfoundation.org Click on "Apply Now" Answer questions as to whom is applying (patient or representative) Your disease fund will be "hypercholesterolemia - Medicare access" They will ask questions about finances and which medications you are taking for cholesterol When you submit, the approval is usually within minutes.  You will need to print the card information from the site You will need to show this information to your pharmacy, they will bill your Medicare Part D plan first -then bill Health Well --for the copay.   You can also call them at [phone], although the hold times can be quite long.     *If you need a refill on your cardiac medications before your next appointment, please call your pharmacy*  Lab Work: In one months: liver function panel In 3 months: NMR lipo profile, Lp(a)  If you have labs (blood work) drawn today and your tests are completely normal, you will receive your results only by: MyChart Message (if you have MyChart) OR A paper copy in the mail If you have any lab test that is abnormal or we need to change your treatment, we will call you to review the results.  Testing/Procedures: none  Follow-Up: As needed

## 2024-02-26 NOTE — Telephone Encounter (Signed)
   Pharmacy Patient Advocate Encounter   Received notification from CoverMyMeds that prior authorization for repatha is required/requested.   Insurance verification completed.   The patient is insured through [facility].   Per test claim: PA required; PA submitted to above mentioned insurance via Latent Key/confirmation #/EOC [id number] Status is pending

## 2024-02-26 NOTE — Progress Notes (Unsigned)
 LIPID CLINIC CONSULT NOTE  Chief Complaint:  Manage dyslipidemia  Primary Care Physician: Fleeta Valeria Mayo, MD  Primary Cardiologist:  None  HPI:  Jason Zimmerman is a 68 y.o. male who is being seen today for the evaluation of dyslipidemia at the request of Fleeta Valeria Mayo, MD. this is a pleasant 68 year old male kindly referred for evaluation management of dyslipidemia.  He is followed for coronary artery disease by Dr. Alverna Sieving in [facility] who he saw recently in September.  He has a history of coronary artery disease with prior MI in 2012 as well is chronic kidney disease and diabetic nephropathy and retinopathy.  He also has a history of prostate cancer.  He had labs in July which showed total cholesterol 196, triglycerides 113, HDL 40 and LDL 135.  His target LDL is likely less than 55.  In the past he had issues with elevated liver enzymes related to statins.  Since then he has not been on any statin therapy, but has been on ezetimibe.  Liver enzymes in July however had normal with AST and ALT of 27 and 25 respectively.  PMHx:  Past Medical History:  Diagnosis Date   Anemia associated with chronic renal failure    CKD (chronic kidney disease), stage V (HCC)    Coronary disease    Diabetic nephropathy associated with type 2 diabetes mellitus (HCC)    Diabetic retinopathy (HCC)    Fatty liver    per patient's wife   GERD (gastroesophageal reflux disease) 03/14/2022   no meds   Heart attack (HCC) 10/19/2010   History of prostate cancer    Hyperlipidemia    Hypertension, renal    Ocular hypertension    Prostate cancer (HCC) 2011   RTA (renal tubular acidosis)    Secondary hyperparathyroidism, renal    Vitamin D deficiency     Past Surgical History:  Procedure Laterality Date   APPENDECTOMY     AV FISTULA PLACEMENT Left 03/29/2022   Procedure: LEFT ARM ARTERIOVENOUS (AV) FISTULA VERSUS ARTERIOVENOUS GRAFT CREATION;  Surgeon: Oris Krystal FALCON, MD;  Location: [facility] OR;   Service: Vascular;  Laterality: Left;   BACK SURGERY     BIOPSY  02/13/2018   Procedure: BIOPSY;  Surgeon: San Sandor GAILS, DO;  Location: [facility];  Service: Gastroenterology;;   COLONOSCOPY WITH PROPOFOL  N/A 02/13/2018   Procedure: COLONOSCOPY WITH PROPOFOL ;  Surgeon: San Sandor GAILS, DO;  Location: [facility];  Service: Gastroenterology;  Laterality: N/A;   CORONARY ANGIOPLASTY     by notes, LAD stent placed 10/2010 in setting of MI   ESOPHAGEAL DILATION  02/13/2018   Procedure: ESOPHAGEAL DILATION;  Surgeon: San Sandor GAILS, DO;  Location: [facility];  Service: Gastroenterology;;   ESOPHAGEAL MANOMETRY N/A 10/05/2022   Procedure: ESOPHAGEAL MANOMETRY (EM);  Surgeon: San Sandor GAILS, DO;  Location: [facility];  Service: Gastroenterology;  Laterality: N/A;   ESOPHAGOGASTRODUODENOSCOPY (EGD) WITH PROPOFOL  N/A 02/13/2018   Procedure: ESOPHAGOGASTRODUODENOSCOPY (EGD) WITH PROPOFOL ;  Surgeon: San Sandor GAILS, DO;  Location: [facility];  Service: Gastroenterology;  Laterality: N/A;   NASAL SEPTUM SURGERY  1981   PARTIAL HIP ARTHROPLASTY Right 03/28/2023   PROSTATE SURGERY     QUADRICEPS TENDON REPAIR Right 02/16/2021   Procedure: RIGHT KNEE OPEN REPAIR QUADRICEP TENDON;  Surgeon: Sharl Mayo Dover, MD;  Location[facility] OR;  Service: Orthopedics;  Laterality: Right;   UPPER GI ENDOSCOPY  03/14/2022   VITRECTOMY  12/21/2006    FAMHx:  Family History  Problem Relation Age of Onset   Cancer Father        Prostate   Heart disease Father    Cirrhosis Maternal Grandmother    Colon cancer Neg Hx    Esophageal cancer Neg Hx     SOCHx:   reports that he has never smoked. He has never been exposed to tobacco smoke. He has never used smokeless tobacco. He reports that he does not currently use alcohol . He reports that he does not use drugs.  ALLERGIES:  Allergies  Allergen Reactions   Cefazolin  Anaphylaxis    Administered for surgical prophylaxis along with  platelets and pt subsequently had presumed allergic reaction with hypotension, tachycardia elevate peak pressures    ROS: Pertinent items noted in HPI and remainder of comprehensive ROS otherwise negative.  HOME MEDS: Current Outpatient Medications on File Prior to Visit  Medication Sig Dispense Refill   acetaminophen  (TYLENOL ) 500 MG tablet Take 1,000 mg by mouth every 6 (six) hours as needed (pain.).     aspirin EC 81 MG tablet Take 81 mg by mouth at bedtime.     carvedilol  (COREG ) 3.125 MG tablet Take 1 tablet (3.125 mg total) by mouth 2 (two) times daily with a meal. 180 tablet 3   Continuous Glucose Receiver (DEXCOM G7 RECEIVER) DEVI Continuous Glucose Monitor Receiver 3 each 2   Continuous Glucose Sensor (DEXCOM G7 SENSOR) MISC Change  sensor every 10 days 3 each 5   ezetimibe (ZETIA) 10 MG tablet Take 10 mg by mouth in the morning.     glucose blood (FREESTYLE TEST STRIPS) test strip 1 each by Other route 2 (two) times daily as needed for other. PRN 102 each 5   insulin  glargine (LANTUS) 100 UNIT/ML Solostar Pen Inject 48 Units into the skin at bedtime. (Patient taking differently: Inject 28 Units into the skin at bedtime. 28 units in the morning and 28 units at night)     LYUMJEV KWIKPEN 100 UNIT/ML KwikPen Inject into the skin.     mycophenolate (MYFORTIC) 180 MG EC tablet Take 180 mg by mouth 2 (two) times daily. (Patient taking differently: Take 180 mg by mouth 2 (two) times daily. 2 Tablets in the morning and 1 Tablet in the evening)     nitroGLYCERIN (NITROSTAT) 0.4 MG SL tablet Place 0.4 mg under the tongue every 5 (five) minutes x 3 doses as needed for chest pain.     omeprazole (PRILOSEC) 20 MG capsule Take 20 mg by mouth daily.     OZEMPIC, 1 MG/DOSE, 4 MG/3ML SOPN Inject 1 mg into the skin once a week.     predniSONE  (DELTASONE ) 10 MG tablet Take 1 tablet (10 mg total) by mouth daily with breakfast. (Patient taking differently: Take 5 mg by mouth daily with breakfast.) 90  tablet 3   tacrolimus  (PROGRAF ) 1 MG capsule Take 1.5 mg by mouth 2 (two) times daily. (Patient taking differently: Take 1 mg by mouth 2 (two) times daily.)     timolol  (TIMOPTIC ) 0.5 % ophthalmic solution Place 1 drop into the left eye 2 (two) times daily. 10 mL 2   ondansetron  (ZOFRAN ) 4 MG tablet Take 1 tablet (4 mg total) by mouth every 8 (eight) hours as needed for nausea or vomiting. (Patient not taking: Reported on 02/26/2024) 20 tablet 0   No current facility-administered medications on file prior to visit.    LABS/IMAGING: No results found for this or any previous visit (from the past 48 hours). No results found.  LIPID PANEL:    Component Value Date/Time   CHOL 196 11/21/2023 1115   TRIG 113 11/21/2023 1115   HDL 40 11/21/2023 1115   CHOLHDL 4.9 11/21/2023 1115   CHOLHDL 3 12/26/2011 1119   VLDL 44.2 (H) 12/26/2011 1119   LDLCALC 135 (H) 11/21/2023 1115   LDLDIRECT 52.1 12/26/2011 1119    No results found for: LIPOA   WEIGHTS: Wt Readings from Last 3 Encounters:  02/26/24 171 lb 6.4 oz (77.7 kg)  02/21/24 173 lb 12.8 oz (78.8 kg)  12/25/23 170 lb (77.1 kg)    VITALS: BP 138/72   Pulse 77   Ht 5' 10 (1.778 m)   Wt 171 lb 6.4 oz (77.7 kg)   SpO2 98%   BMI 24.59 kg/m   EXAM: Deferred  EKG: Deferred  ASSESSMENT: Dyslipidemia, goal LDL less than 55 Coronary artery disease with prior MI End-stage renal disease History of elevated liver enzymes related to statins  PLAN: 1.   Mr. Morken has a dyslipidemia but remains well above target LDL less than 55.  He needs additional lipid-lowering but has had issues with statins in the past including transaminitis.  He has been able to tolerate ezetimibe. His liver enzymes have recently normalized.  I would advise reaching out for prior authorization for PCSK9 inhibitor.  This should be well-tolerated but we will plan to repeat liver enzymes in 2 to 4 weeks after therapy.  If all is tolerated then repeat lipids  including NMR and LP(a) in about 3 to 4 months.  If established, he could follow-up with his general cardiologist who may also be able to prescribe his Repatha going forward.  Thanks again for the kind referral.  Jason KYM Maxcy, MD, [facility], FNLA, FACP  [place]  [facility]  Medical Director of the Advanced Lipid Disorders &  Cardiovascular Risk Reduction Clinic Diplomate of the American Board of Clinical Lipidology Attending Cardiologist  Direct Dial: [phone]  Fax: [phone]  Website:  [email or web address] Jason Zimmerman Ezme Duch 02/26/2024, 12:05 PM

## 2024-02-27 NOTE — Telephone Encounter (Signed)
 Pharmacy Patient Advocate Encounter  Received notification from [facility] that Prior Authorization for repatha has been APPROVED from 02/26/24 to 02/25/25   PA #/Case ID/Reference #: [id number]

## 2024-03-04 ENCOUNTER — Encounter: Payer: Self-pay | Admitting: Radiology

## 2024-03-13 DIAGNOSIS — I129 Hypertensive chronic kidney disease with stage 1 through stage 4 chronic kidney disease, or unspecified chronic kidney disease: Secondary | ICD-10-CM | POA: Diagnosis not present

## 2024-03-13 DIAGNOSIS — E1122 Type 2 diabetes mellitus with diabetic chronic kidney disease: Secondary | ICD-10-CM | POA: Diagnosis not present

## 2024-03-13 DIAGNOSIS — N1831 Chronic kidney disease, stage 3a: Secondary | ICD-10-CM | POA: Diagnosis not present

## 2024-03-13 DIAGNOSIS — Z940 Kidney transplant status: Secondary | ICD-10-CM | POA: Diagnosis not present

## 2024-03-14 ENCOUNTER — Encounter: Payer: Self-pay | Admitting: *Deleted

## 2024-03-14 NOTE — Progress Notes (Signed)
 Jason Zimmerman                                          MRN: [id number]   03/14/2024   The VBCI Quality Team Specialist reviewed this patient medical record for the purposes of chart review for care gap closure. The following were reviewed: abstraction for care gap closure-glycemic status assessment.    VBCI Quality Team

## 2024-05-21 ENCOUNTER — Ambulatory Visit: Admitting: Internal Medicine

## 2024-05-21 ENCOUNTER — Ambulatory Visit

## 2024-05-21 VITALS — BP 118/62 | HR 83 | Temp 98.3°F | Resp 16 | Ht 70.0 in | Wt 172.6 lb

## 2024-05-21 DIAGNOSIS — E1121 Type 2 diabetes mellitus with diabetic nephropathy: Secondary | ICD-10-CM

## 2024-05-21 DIAGNOSIS — I10 Essential (primary) hypertension: Secondary | ICD-10-CM

## 2024-05-21 NOTE — Progress Notes (Signed)
 Patient Name: Jason Zimmerman Date of Birth: [birth year] Age: 69 years Date of Visit: 05/21/2024 Visit Type: Follow-up Vital Signs: BP: 118/62 mmHg HR: 83 bpm Temp: 98.3 F Weight: 172.9 lbs Height: 5'10 Resp: 16 O2 Sat: 99% on room air Pain: Denies Allergies: None reported  Subjective (S) Hypertension: Patient follows home BP monitoring; reports occasional readings around 127/70. Denies headaches, vision changes, dizziness, chest pain, shortness of breath, weakness, numbness, or edema. Takes Carvedilol  as prescribed; no missed doses. Diabetes: Uses Freestyle Libre 3; glucose ranges 110-200 mg/dL. Reports snacking at night. Taking Lantus 28 units, Lyumjev (Lymju), and Ozempic as prescribed; no missed doses. Denies hypoglycemia, abdominal pain, nausea, vomiting, or diarrhea. Completed annual diabetic eye exam with Dr. Elner. Renal transplant history (2023): Last nephrology visit 11/29/2023. No current complaints. Reports overall good adherence to medications and home monitoring.  Review of Systems (ROS) Constitutional: Denies fever, chills, fatigue, weight loss. HEENT: Denies vision changes, headaches, dizziness. Cardiovascular: Denies chest pain, palpitations, edema. Respiratory: Denies shortness of breath, cough, wheezing. Gastrointestinal: Denies abdominal pain, nausea, vomiting, diarrhea. Genitourinary: No dysuria or changes in urination reported. Musculoskeletal: Denies muscle aches, joint pain. Neurological: Denies weakness, numbness, tingling. Endocrine: Reports diabetes; glucose monitoring noted. Psychiatric: Denies depression, anxiety.  Physical Exam (PE) General: Well-appearing, alert, in no acute distress. HEENT: Pupils equal, round, reactive to light; oropharynx clear; no visual deficits noted. Neck: Supple, no lymphadenopathy, no thyroid  enlargement. Cardiovascular: RRR, no murmurs, rubs, or gallops; no peripheral edema. Respiratory: Lungs clear to auscultation  bilaterally; no wheezing. Abdomen: Soft, non-tender, no organomegaly; surgical site from transplant well-healed. Extremities: No edema, normal pulses. Neurological: Alert and oriented 3; strength 5/5 in all extremities, reflexes 2+ and symmetric.   Assessment (A) Hypertension - controlled on Carvedilol . Type 2 Diabetes Mellitus - on insulin , Lyumjev, and Ozempic; home glucose readings show variability, particularly overnight. Renal transplant (2023) - stable; follow-up with nephrology as previously scheduled. Preventive care - diabetic eye exam up-to-date.  Plan (P) Hypertension: Continue Carvedilol  as prescribed. Continue home BP monitoring; document readings. Diabetes: Continue Lantus, Lyumjev, and Ozempic as prescribed. Advise limiting nighttime snacks and monitoring post-snack glucose. Continue using Freestyle Libre 3; log readings for provider review. Follow-Up: Routine follow-up in 6 months or sooner for elevated BP, hyperglycemia, or other symptoms. Patient Education: Reviewed importance of medication adherence for BP and diabetes. Discussed blood pressure and blood glucose monitoring at home. Reviewed diet strategies, including limiting nighttime snacking and carbohydrate intake. Educated on signs/symptoms of hypoglycemia and hyperglycemia. Reinforced need for annual diabetic eye exams. Advised to call clinic for concerning symptoms (chest pain, dizziness, sudden edema, or unusual changes in glucose).  Jon Bihari, NP

## 2024-05-22 NOTE — Progress Notes (Signed)
 Jason Zimmerman                                          MRN: [id number]   05/22/2024   The [facility] Quality Team Specialist reviewed this patient medical record for the purposes of chart review for care gap closure. The following were reviewed: chart review for care gap closure-glycemic status assessment.    [facility] Quality Team

## 2024-10-22 ENCOUNTER — Ambulatory Visit
